# Patient Record
Sex: Female | Born: 1960 | Race: Black or African American | Hispanic: No | Marital: Married | Smoking: Former smoker
Health system: Southern US, Community
[De-identification: ages and names within clinical notes are randomized; demographics above are authoritative.]

## PROBLEM LIST (undated history)

## (undated) DIAGNOSIS — J449 Chronic obstructive pulmonary disease, unspecified: Secondary | ICD-10-CM

## (undated) DIAGNOSIS — J45909 Unspecified asthma, uncomplicated: Secondary | ICD-10-CM

## (undated) DIAGNOSIS — T7840XA Allergy, unspecified, initial encounter: Secondary | ICD-10-CM

## (undated) DIAGNOSIS — K429 Umbilical hernia without obstruction or gangrene: Secondary | ICD-10-CM

## (undated) DIAGNOSIS — G932 Benign intracranial hypertension: Secondary | ICD-10-CM

## (undated) DIAGNOSIS — I10 Essential (primary) hypertension: Secondary | ICD-10-CM

## (undated) DIAGNOSIS — E119 Type 2 diabetes mellitus without complications: Secondary | ICD-10-CM

## (undated) HISTORY — DX: Morbid (severe) obesity due to excess calories: E66.01

## (undated) HISTORY — PX: NASAL POLYP SURGERY: SHX186

## (undated) HISTORY — DX: Type 2 diabetes mellitus without complications: E11.9

## (undated) HISTORY — DX: Benign intracranial hypertension: G93.2

## (undated) HISTORY — DX: Essential (primary) hypertension: I10

## (undated) HISTORY — PX: CHOLECYSTECTOMY: SHX55

## (undated) HISTORY — DX: Umbilical hernia without obstruction or gangrene: K42.9

## (undated) HISTORY — PX: CARPAL TUNNEL RELEASE: SHX101

## (undated) HISTORY — DX: Unspecified asthma, uncomplicated: J45.909

## (undated) HISTORY — DX: Allergy, unspecified, initial encounter: T78.40XA

---

## 2000-11-04 ENCOUNTER — Other Ambulatory Visit: Admission: RE | Admit: 2000-11-04 | Discharge: 2000-11-04 | Payer: Self-pay | Admitting: Obstetrics and Gynecology

## 2001-11-29 ENCOUNTER — Ambulatory Visit (HOSPITAL_COMMUNITY): Admission: RE | Admit: 2001-11-29 | Discharge: 2001-11-29 | Payer: Self-pay | Admitting: Obstetrics and Gynecology

## 2001-11-29 ENCOUNTER — Encounter: Payer: Self-pay | Admitting: Obstetrics and Gynecology

## 2003-05-10 ENCOUNTER — Ambulatory Visit (HOSPITAL_COMMUNITY): Admission: RE | Admit: 2003-05-10 | Discharge: 2003-05-10 | Payer: Self-pay | Admitting: Internal Medicine

## 2004-04-18 ENCOUNTER — Emergency Department (HOSPITAL_COMMUNITY): Admission: EM | Admit: 2004-04-18 | Discharge: 2004-04-18 | Payer: Self-pay | Admitting: Emergency Medicine

## 2004-07-26 ENCOUNTER — Emergency Department (HOSPITAL_COMMUNITY): Admission: EM | Admit: 2004-07-26 | Discharge: 2004-07-26 | Payer: Self-pay | Admitting: Emergency Medicine

## 2004-09-16 ENCOUNTER — Emergency Department (HOSPITAL_COMMUNITY): Admission: EM | Admit: 2004-09-16 | Discharge: 2004-09-16 | Payer: Self-pay | Admitting: Emergency Medicine

## 2005-02-26 ENCOUNTER — Ambulatory Visit (HOSPITAL_COMMUNITY): Admission: RE | Admit: 2005-02-26 | Discharge: 2005-02-26 | Payer: Self-pay | Admitting: Internal Medicine

## 2006-08-03 ENCOUNTER — Ambulatory Visit (HOSPITAL_COMMUNITY): Admission: RE | Admit: 2006-08-03 | Discharge: 2006-08-03 | Payer: Self-pay | Admitting: Internal Medicine

## 2006-08-08 ENCOUNTER — Emergency Department (HOSPITAL_COMMUNITY): Admission: EM | Admit: 2006-08-08 | Discharge: 2006-08-08 | Payer: Self-pay | Admitting: Emergency Medicine

## 2006-08-17 ENCOUNTER — Ambulatory Visit: Payer: Self-pay | Admitting: Orthopedic Surgery

## 2006-09-17 ENCOUNTER — Ambulatory Visit: Payer: Self-pay | Admitting: Orthopedic Surgery

## 2006-11-17 ENCOUNTER — Ambulatory Visit: Payer: Self-pay | Admitting: Orthopedic Surgery

## 2009-01-05 ENCOUNTER — Emergency Department (HOSPITAL_COMMUNITY): Admission: EM | Admit: 2009-01-05 | Discharge: 2009-01-05 | Payer: Self-pay | Admitting: Emergency Medicine

## 2009-07-15 ENCOUNTER — Emergency Department (HOSPITAL_COMMUNITY): Admission: EM | Admit: 2009-07-15 | Discharge: 2009-07-15 | Payer: Self-pay | Admitting: Emergency Medicine

## 2009-10-28 ENCOUNTER — Emergency Department (HOSPITAL_COMMUNITY): Admission: EM | Admit: 2009-10-28 | Discharge: 2009-10-28 | Payer: Self-pay | Admitting: Emergency Medicine

## 2010-06-29 ENCOUNTER — Encounter: Payer: Self-pay | Admitting: Internal Medicine

## 2010-06-30 ENCOUNTER — Encounter: Payer: Self-pay | Admitting: Internal Medicine

## 2010-08-28 LAB — DIFFERENTIAL
Basophils Absolute: 0 10*3/uL (ref 0.0–0.1)
Basophils Relative: 0 % (ref 0–1)
Eosinophils Absolute: 0.1 10*3/uL (ref 0.0–0.7)
Eosinophils Relative: 1 % (ref 0–5)
Lymphocytes Relative: 3 % — ABNORMAL LOW (ref 12–46)
Lymphs Abs: 0.3 10*3/uL — ABNORMAL LOW (ref 0.7–4.0)
Monocytes Absolute: 0.4 10*3/uL (ref 0.1–1.0)
Monocytes Relative: 4 % (ref 3–12)
Neutro Abs: 8.8 10*3/uL — ABNORMAL HIGH (ref 1.7–7.7)
Neutrophils Relative %: 92 % — ABNORMAL HIGH (ref 43–77)

## 2010-08-28 LAB — PREGNANCY, URINE: Preg Test, Ur: NEGATIVE

## 2010-08-28 LAB — CBC
HCT: 43.9 % (ref 36.0–46.0)
Hemoglobin: 14.5 g/dL (ref 12.0–15.0)
MCHC: 33 g/dL (ref 30.0–36.0)
MCV: 81.6 fL (ref 78.0–100.0)
Platelets: 323 10*3/uL (ref 150–400)
RBC: 5.37 MIL/uL — ABNORMAL HIGH (ref 3.87–5.11)
RDW: 13.7 % (ref 11.5–15.5)
WBC: 9.6 10*3/uL (ref 4.0–10.5)

## 2010-08-28 LAB — URINALYSIS, ROUTINE W REFLEX MICROSCOPIC
Glucose, UA: NEGATIVE mg/dL
Ketones, ur: NEGATIVE mg/dL
Leukocytes, UA: NEGATIVE
Nitrite: NEGATIVE
Protein, ur: 100 mg/dL — AB
Specific Gravity, Urine: >1.03 — ABNORMAL HIGH (ref 1.005–1.030)
Urobilinogen, UA: 0.2 mg/dL (ref 0.0–1.0)
pH: 5.5 (ref 5.0–8.0)

## 2010-08-28 LAB — COMPREHENSIVE METABOLIC PANEL
ALT: 18 U/L (ref 0–35)
AST: 18 U/L (ref 0–37)
Albumin: 3.8 g/dL (ref 3.5–5.2)
Alkaline Phosphatase: 55 U/L (ref 39–117)
BUN: 12 mg/dL (ref 6–23)
CO2: 27 mEq/L (ref 19–32)
Calcium: 8.9 mg/dL (ref 8.4–10.5)
Chloride: 103 mEq/L (ref 96–112)
Creatinine, Ser: 0.74 mg/dL (ref 0.4–1.2)
GFR calc Af Amer: >60 mL/min (ref >60)
GFR calc non Af Amer: >60 mL/min (ref >60)
Glucose, Bld: 124 mg/dL — ABNORMAL HIGH (ref 70–99)
Potassium: 4.5 mEq/L (ref 3.5–5.1)
Sodium: 137 mEq/L (ref 135–145)
Total Bilirubin: 0.5 mg/dL (ref 0.3–1.2)
Total Protein: 7.1 g/dL (ref 6.0–8.3)

## 2010-08-28 LAB — URINE CULTURE: Colony Count: 15000

## 2010-08-28 LAB — URINE MICROSCOPIC-ADD ON

## 2010-08-28 LAB — LIPASE, BLOOD: Lipase: 16 U/L (ref 11–59)

## 2010-08-30 ENCOUNTER — Emergency Department (HOSPITAL_COMMUNITY)
Admission: EM | Admit: 2010-08-30 | Discharge: 2010-08-30 | Disposition: A | Discharge status: Home / Self Care | Payer: 59 | Attending: Emergency Medicine | Admitting: Emergency Medicine

## 2010-08-30 DIAGNOSIS — Z79899 Other long term (current) drug therapy: Secondary | ICD-10-CM | POA: Insufficient documentation

## 2010-08-30 DIAGNOSIS — I10 Essential (primary) hypertension: Secondary | ICD-10-CM | POA: Insufficient documentation

## 2010-08-30 DIAGNOSIS — J449 Chronic obstructive pulmonary disease, unspecified: Secondary | ICD-10-CM | POA: Insufficient documentation

## 2010-08-30 DIAGNOSIS — J4489 Other specified chronic obstructive pulmonary disease: Secondary | ICD-10-CM | POA: Insufficient documentation

## 2010-08-30 DIAGNOSIS — G8929 Other chronic pain: Secondary | ICD-10-CM | POA: Insufficient documentation

## 2010-08-30 DIAGNOSIS — E669 Obesity, unspecified: Secondary | ICD-10-CM | POA: Insufficient documentation

## 2010-08-30 DIAGNOSIS — L02419 Cutaneous abscess of limb, unspecified: Secondary | ICD-10-CM | POA: Insufficient documentation

## 2010-08-30 DIAGNOSIS — L03119 Cellulitis of unspecified part of limb: Secondary | ICD-10-CM | POA: Insufficient documentation

## 2010-08-30 DIAGNOSIS — E119 Type 2 diabetes mellitus without complications: Secondary | ICD-10-CM | POA: Insufficient documentation

## 2010-09-02 LAB — CULTURE, ROUTINE-ABSCESS

## 2010-10-25 NOTE — Consult Note (Signed)
[name], [name]                ACCOUNT NO.:  [id number]   MEDICAL RECORD NO.:  [id number]          PATIENT TYPE:  EMS   LOCATION:  ED                            FACILITY:  [facility]   PHYSICIAN:  Tilda Burrow, M.D. DATE OF BIRTH:  [birth year]   DATE OF CONSULTATION:  09/16/2004  DATE OF DISCHARGE:                                   CONSULTATION   CHIEF COMPLAINT:  Right lower quadrant pain, acute onset.   HISTORY OF PRESENT ILLNESS:  This 50 year old female with 20+ years of  infertility managed by Dr. Felecia Shelling for general medical problems, including  asthma and hypertension, presents to labor and delivery with a chief  complaint of right lower quadrant pain, acute onset this a.m., making the  patient leave work.  LMP was August 22, 2004.  The patient is not using  contraception, is sexually active as she has been for many years.  Pelvic  ultrasound performed by Dr. 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   Vital signs: Temperature 96.3, blood pressure 127/70, pulse 81, respirations  22.  Pain rated an 8/10 at 10 a.m. but by the time seen by me at 3 p.m., she  was pain-free due to IV analgesics and passage of time.   NEW MEDICATION LIST:  1.  Lisinopril 40 mg daily.  2.  Lasix p.o. daily.  3.  Albuterol MDI inhaler p.r.n.   Abdominal exam shows a nontender, obese abdomen with limited ability to  assess internal organs due to abdominal girth.  The patient is,  interestingly, quite optimistic at the possibility of having a hysterectomy  given that she hates the heavy menses she has been tolerating for a period  of time.   Laboratory data includes hemoglobin 13.3, hematocrit 39, white count 7600,  platelets 347,000.  Sodium 137, potassium 3.9.   PLAN:  Functional gynecologic pain felt most likely.  Gastrointestinal  review of systems negative.  Will simply follow up one week for change in  symptoms.  We can discuss review of old GYN records and reassessment of  right adnexa.   DISCHARGE MEDICATIONS:  Tylenol No. 3, 30 tablets, refill x1, one to two  q.4h. p.r.n. pain.    JVF/MEDQ  D:  09/16/2004  T:  09/16/2004  Job:  [id number]

## 2011-03-18 ENCOUNTER — Other Ambulatory Visit (HOSPITAL_COMMUNITY): Payer: Self-pay | Admitting: Internal Medicine

## 2011-03-18 DIAGNOSIS — Z139 Encounter for screening, unspecified: Secondary | ICD-10-CM

## 2011-03-24 ENCOUNTER — Telehealth: Payer: Self-pay

## 2011-03-24 NOTE — Telephone Encounter (Signed)
LMOM to call.

## 2011-03-26 ENCOUNTER — Telehealth: Payer: Self-pay | Admitting: Gastroenterology

## 2011-03-26 NOTE — Telephone Encounter (Signed)
PT RETURNED YOUR CALL SHE CAN BE REACHED AT [phone]

## 2011-03-26 NOTE — Telephone Encounter (Signed)
LMOM to call.

## 2011-03-27 NOTE — Telephone Encounter (Signed)
LMOM to call.

## 2011-04-01 ENCOUNTER — Ambulatory Visit (HOSPITAL_COMMUNITY): Payer: 59

## 2011-04-02 NOTE — Telephone Encounter (Signed)
LMOM to call. Mailing a letter to call also.  

## 2011-04-02 NOTE — Telephone Encounter (Signed)
Unable to reach pt by phone. Mailed a letter to call.

## 2012-09-16 ENCOUNTER — Other Ambulatory Visit: Payer: Self-pay | Admitting: Obstetrics and Gynecology

## 2012-11-04 ENCOUNTER — Other Ambulatory Visit: Payer: Self-pay | Admitting: Obstetrics and Gynecology

## 2012-11-17 ENCOUNTER — Other Ambulatory Visit: Payer: Self-pay

## 2012-11-17 DIAGNOSIS — G4730 Sleep apnea, unspecified: Secondary | ICD-10-CM

## 2012-12-12 ENCOUNTER — Ambulatory Visit: Payer: 59 | Attending: Internal Medicine | Admitting: Sleep Medicine

## 2012-12-12 DIAGNOSIS — G4730 Sleep apnea, unspecified: Secondary | ICD-10-CM

## 2012-12-12 DIAGNOSIS — G4733 Obstructive sleep apnea (adult) (pediatric): Secondary | ICD-10-CM | POA: Insufficient documentation

## 2012-12-13 NOTE — Procedures (Signed)
[facility] Marnita Poirier A. Gerilyn Pilgrim, MD     [email or web address]        NAMESOO, STEELMAN                ACCOUNT NO.:  [id number]  MEDICAL RECORD NO.:  [id number]          PATIENT TYPE:  OUT  LOCATION:  SLEEP LAB                     FACILITY:  [facility]  PHYSICIAN:  Siobhan Zaro A. Gerilyn Pilgrim, M.D. DATE OF BIRTH:  Oct 07, 1960  DATE OF STUDY:  12/12/2012                           NOCTURNAL POLYSOMNOGRAM  REFERRING PHYSICIAN:  Tesfaye D. Felecia Shelling, MD  INDICATION:  A 52 year old who presents with obesity, loud snoring, and fatigue.  MEDICATIONS:  None.  EPWORTH SLEEPINESS SCALE:  5.  BMI:  50.  ARCHITECTURAL SUMMARY:  The total recording time is 400 minutes.  Sleep efficiency 79%.  Sleep latency 8 minutes.  REM latency 67 minutes. Stage N1 9%, N2 is 48%, N3 28%, and REM sleep 50%.  RESPIRATORY SUMMARY:  Baseline oxygen saturation is 97, lowest saturation 77 during REM sleep.  Diagnostic AHI is 18 and RDI also 18. The patient had more events during REM sleep with a REM AHI being 72.  LIMB MOVEMENT SUMMARY:  PLM index 0.  ELECTROCARDIOGRAM SUMMARY:  Average heart rate is 83 with no significant dysrhythmias observed.  IMPRESSION:  Moderate REM related obstructive sleep apnea syndrome.  RECOMMENDATION:  Formal CPAP titration study.  Thanks for this referral.     Dora Simeone A. Gerilyn Pilgrim, M.D.    KAD/MEDQ  D:  12/13/2012 08:59:53  T:  12/13/2012 09:29:33  Job:  [id number]

## 2012-12-15 ENCOUNTER — Other Ambulatory Visit: Payer: Self-pay | Admitting: Obstetrics and Gynecology

## 2013-02-16 ENCOUNTER — Other Ambulatory Visit (HOSPITAL_COMMUNITY): Payer: Self-pay | Admitting: Internal Medicine

## 2013-02-16 DIAGNOSIS — Z139 Encounter for screening, unspecified: Secondary | ICD-10-CM

## 2013-02-21 ENCOUNTER — Inpatient Hospital Stay (HOSPITAL_COMMUNITY): Admission: RE | Admit: 2013-02-21 | Payer: 59 | Source: Ambulatory Visit

## 2013-03-07 ENCOUNTER — Other Ambulatory Visit: Payer: Self-pay | Admitting: Women's Health

## 2013-03-07 ENCOUNTER — Encounter: Payer: Self-pay | Admitting: Women's Health

## 2013-03-07 ENCOUNTER — Ambulatory Visit (INDEPENDENT_AMBULATORY_CARE_PROVIDER_SITE_OTHER): Payer: 59 | Admitting: Women's Health

## 2013-03-07 ENCOUNTER — Other Ambulatory Visit (HOSPITAL_COMMUNITY)
Admission: RE | Admit: 2013-03-07 | Discharge: 2013-03-07 | Disposition: A | Discharge status: Home / Self Care | Payer: 59 | Source: Ambulatory Visit | Attending: Obstetrics & Gynecology | Admitting: Obstetrics & Gynecology

## 2013-03-07 VITALS — BP 150/80 | Ht 61.0 in | Wt 253.2 lb

## 2013-03-07 DIAGNOSIS — Z1151 Encounter for screening for human papillomavirus (HPV): Secondary | ICD-10-CM | POA: Insufficient documentation

## 2013-03-07 DIAGNOSIS — N921 Excessive and frequent menstruation with irregular cycle: Secondary | ICD-10-CM

## 2013-03-07 DIAGNOSIS — Z01419 Encounter for gynecological examination (general) (routine) without abnormal findings: Secondary | ICD-10-CM | POA: Insufficient documentation

## 2013-03-07 DIAGNOSIS — Z6841 Body Mass Index (BMI) 40.0 and over, adult: Secondary | ICD-10-CM

## 2013-03-07 DIAGNOSIS — I10 Essential (primary) hypertension: Secondary | ICD-10-CM | POA: Insufficient documentation

## 2013-03-07 DIAGNOSIS — N950 Postmenopausal bleeding: Secondary | ICD-10-CM

## 2013-03-07 DIAGNOSIS — J45909 Unspecified asthma, uncomplicated: Secondary | ICD-10-CM | POA: Insufficient documentation

## 2013-03-07 DIAGNOSIS — E119 Type 2 diabetes mellitus without complications: Secondary | ICD-10-CM | POA: Insufficient documentation

## 2013-03-07 LAB — CBC
HCT: 31.4 % — ABNORMAL LOW (ref 36.0–46.0)
Hemoglobin: 10.2 g/dL — ABNORMAL LOW (ref 12.0–15.0)
MCH: 23.8 pg — ABNORMAL LOW (ref 26.0–34.0)
MCHC: 32.5 g/dL (ref 30.0–36.0)
MCV: 73.2 fL — ABNORMAL LOW (ref 78.0–100.0)
Platelets: 425 10*3/uL — ABNORMAL HIGH (ref 150–400)
RBC: 4.29 MIL/uL (ref 3.87–5.11)
RDW: 15.2 % (ref 11.5–15.5)
WBC: 9.6 10*3/uL (ref 4.0–10.5)

## 2013-03-07 NOTE — Progress Notes (Signed)
Patient ID: [name], female   DOB: 10-Jun-1960, 52 y.o.   MRN: [id number] Subjective:     [name] is a 52 y.o. [id number] African American married female here for what was down for as a new gyn visit d/t abnormal bleeding, but pt states was also supposed to be a routine well-woman exam. Referred by PCP Dr. Felecia Shelling.  Patient's last menstrual period was 03/02/2013.  Reports periods stopped for 365 days approximately 5 years ago at the same time she was having terrible hot flashes.  Per pt report, on day 366 she began bleeding again and has been having heavy bleeding lasting 5-6 days/month x the last 4 years. For the past 3 months, the bleeding has become heavier, is lasting 7-14 days, and cramps/pain has increased. She is changing a saturated pad ~5-6x/day and has silver-dollar sized clots. This month she bled 9/6-9/17, and then it began again on 9/24, and is currently still bleeding. Does take ASA 81mg  daily. Thinks she may have had fibroids at one time, but doesn't recall. Reports her mom had to have a hysterectomy d/t abnormal bleeding.  Dyspareunia x 7-8 months. Denies bladder or bowel problems.  CHTN and DM managed by Dr. Derrell Lolling routine labs by Dr. Felecia Shelling, states he has also checked her TSH recently and it was normal. Reports A1C as being around 7, is on metformin, and tries to watch carb intake. Doesn't exercise daily. Is on Lisinopril for CHTN.   Smoking Status: quit smoking in 1997  Personal health questionnaire reviewed: yes.   Gynecologic History Patient's last menstrual period was 03/02/2013. Contraception: none Last Pap: she thinks about 52yrs ago. Results were: thinks it was abnormal, but doesn't recall any procedures or f/u Last mammogram: 6-7 years ago. Results were: pt reports there was a pea-sized spot in one of breasts on   mammogram, and was told if she noticed any changes to come back.  Does report checking breasts monthly,   and states she is unable to feel any lumps, and  hasn't noticed any changes in breasts Has never had a colonoscopy, but states Dr. Felecia Shelling had referred her to have this done, and she has received papers in   mail to schedule this, and she plans on doing so asap  Obstetric History OB History  No data available  [phone], both SVDs   The following portions of the patient's history were reviewed and updated as appropriate: allergies, current medications, past family history, past medical history, past social history, past surgical history and problem list.  Review of Systems  Review of Systems  Constitutional: Negative for fever, chills, weight loss, malaise/fatigue and diaphoresis.  HENT: Negative for hearing loss, ear pain, nosebleeds, congestion, sore throat, neck       pain, tinnitus and ear discharge.   Eyes: Negative for blurred vision, double vision, photophobia, pain, discharge and       redness.  Respiratory: Negative for cough, hemoptysis, sputum production, shortness of breath,       wheezing and stridor.   Cardiovascular: Negative for chest pain, palpitations, orthopnea, claudication, leg       swelling and PND.  Gastrointestinal: negative for abdominal pain. Negative for heartburn, nausea, vomiting,       diarrhea, constipation, blood in stool and melena.  Genitourinary: Negative for dysuria, urgency, frequency, hematuria, flank pain,       Incontinence. Pos for dyspareunia, postmenopausal bleeding  Musculoskeletal: Negative for myalgias, back pain, joint pain and falls.  Skin: Negative for  itching and rash.  Neurological: Negative for dizziness, tingling, tremors, sensory change, speech change,     focal weakness, seizures, loss of consciousness, weakness and headaches.  Endo/Heme/Allergies: Negative for environmental allergies and polydipsia. Does not       bruise/bleed easily.  Psychiatric/Behavioral: Negative for depression, suicidal ideas, hallucinations, memory       loss and substance abuse. The patient is not  nervous/anxious and does not have       insomnia.        Objective:     Physical Exam  BP 150/80  Ht 5\' 1"  (1.549 m)  Wt 253 lb 3.2 oz (114.851 kg)  BMI 47.87 kg/m2  LMP 03/02/2013 Constitutional: She is oriented to person, place, and time. She appears well-developed    and well-nourished.  HEENT:     Head: Normocephalic and atraumatic.           Right Ear: External ear normal.     Left Ear: External ear normal.     Nose: Nose normal.     Mouth/Throat: Oropharynx is clear and moist.     Eyes: Conjunctivae and EOM are normal. Pupils are equal, round, and reactive to light.    Right eye exhibits no discharge. Left eye exhibits no discharge. No scleral icterus.  Neck: Normal range of motion. Neck supple. No tracheal deviation present. No          thyromegaly present.  Cardiovascular: Normal rate, regular rhythm, normal heart sounds and intact distal          pulses.  Exam reveals no gallop and no friction rub.  No murmur heard. Respiratory: Effort normal and breath sounds normal. No respiratory distress. She has       no wheezes. She has no rales. She exhibits no tenderness.  Breasts: no dominate palpable mass, retraction or nipple discharge  GI: Soft. Bowel sounds are normal. She exhibits no distension and no mass. There is no    tenderness. There is no rebound and no guarding.     Hemoccult: Pos, but blood from vagina had been around rectum. Had pt wipe really well w/ wet wipes and no blood    was visible on exam glove.  Unsure if this is a true pos, or possible contamination from vb.  Genitourinary:     Vulva is normal without lesions    Vagina is pink moist w/ dark red blood    Cervix normal in appearance and thin prep pap is done w/ HR HPV cotesting    Uterus: unable to palpate d/t body habitus    Adnexa: unable to palpate d/t body habitus Musculoskeletal: Normal range of motion. She exhibits no edema and no tenderness.  Neurological: She is alert and oriented to person,  place, and time. She has normal    reflexes. She displays normal reflexes. No cranial nerve deficit. She exhibits normal    muscle tone. Coordination normal.  Skin: Skin is warm and dry. No rash noted. No erythema. No pallor.  Psychiatric: She has a normal mood and affect. Her behavior is normal. Judgment and    thought content normal.       Assessment:     Healthy well-woman exam. Postmenopausal bleeding x 4 yrs Dyspareunia Pos hemoccult, but possibly contaminated by vb Morbid obesity BMI 47.87 CHTN on Lisinopril DM on Metformin Asthma Needs screening Mammogram and TCS      Plan:  CBC, FSH today F/U ASAP for pelvic u/s and f/u appt to discuss results  w/ MD Recommended home hemoccult sampling, but pt states she will call and schedule GI appt/TCS asap To sign release of records and obtain last pap results from Dr. Mora Appl and latest labs from Dr. Felecia Shelling To begin walking x daily, decrease carbs Mammogram ASAP  Colonoscopy ASAP   Marge Duncans 03/07/2013 5:16 PM

## 2013-03-07 NOTE — Patient Instructions (Signed)
Postmenopausal Bleeding Menopause is commonly referred to as the "change in life." It is a time when the fertile years, the time of ovulating and having menstrual periods, has come to an end. It is also determined by not having menstrual periods for 12 months.  Postmenopausal bleeding is any bleeding a woman has after she has entered into menopause. Any type of postmenopausal bleeding, even if it appears to be a typical menstrual period, is concerning. This should be evaluated by your caregiver.  CAUSES   Hormone therapy.  Cancer of the cervix or cancer of the lining of the uterus (endometrial cancer).  Thinning of the uterine lining (uterine atrophy).  Thyroid diseases.  Certain medicines.  Infection of the uterus or cervix.  Inflammation or irritation of the uterine lining (endometritis).  Estrogen-secreting tumors.  Growths (polyps) on the cervix, uterine lining, or uterus.  Uterine tumors (fibroids).  Being very overweight (obese). DIAGNOSIS  Your caregiver will take a medical history and ask questions. A physical exam will also be performed. Further tests may include:   A transvaginal ultrasound. An ultrasound wand or probe is inserted into your vagina to view the pelvic organs.  A biopsy of the lining of the uterus (endometrium). A sample of the endometrium is removed and examined.  A hysteroscopy. Your caregiver may use an instrument with a light and a camera attached to it (hysteroscope). The hysteroscope is used to look inside the uterus for problems.  A dilation and curettage (D&C). Tissue is removed from the uterine lining to be examined for problems. TREATMENT  Treatment depends on the cause of the bleeding. Some treatments include:   Surgery.  Medicines.  Hormones.  A hysteroscopy or D&C to remove polyps or fibroids.  Changing or stopping a current medicine you are taking. Talk to your caregiver about your specific treatment. HOME CARE INSTRUCTIONS    Maintain a healthy weight.  Keep regular pelvic exams and Pap tests. SEEK MEDICAL CARE IF:   You have bleeding, even if it is light in comparison to your previous periods.  Your bleeding lasts more than 1 week.  You have abdominal pain.  You develop bleeding with sexual intercourse. SEEK IMMEDIATE MEDICAL CARE IF:   You have a fever, chills, headache, dizziness, muscle aches, and bleeding.  You have severe pain with bleeding.  You are passing blood clots.  You have bleeding and need more than 1 pad an hour.  You feel faint. MAKE SURE YOU:  Understand these instructions.  Will watch your condition.  Will get help right away if you are not doing well or get worse. Document Released: 09/03/2005 Document Revised: 08/18/2011 Document Reviewed: 01/30/2011 [employer] Patient Information 2014 [employer].  

## 2013-03-08 ENCOUNTER — Encounter: Payer: Self-pay | Admitting: Women's Health

## 2013-03-08 LAB — FOLLICLE STIMULATING HORMONE: FSH: 8 m[IU]/mL

## 2013-03-08 NOTE — Addendum Note (Signed)
Addended by: Cheral Marker on: 03/08/2013 04:24 PM   Modules accepted: Orders

## 2013-03-09 LAB — TSH: TSH: 3.399 u[IU]/mL (ref 0.350–4.500)

## 2013-03-15 ENCOUNTER — Ambulatory Visit (INDEPENDENT_AMBULATORY_CARE_PROVIDER_SITE_OTHER): Payer: 59 | Admitting: Obstetrics & Gynecology

## 2013-03-15 ENCOUNTER — Ambulatory Visit (INDEPENDENT_AMBULATORY_CARE_PROVIDER_SITE_OTHER): Payer: 59

## 2013-03-15 ENCOUNTER — Other Ambulatory Visit: Payer: Self-pay | Admitting: Women's Health

## 2013-03-15 ENCOUNTER — Telehealth: Payer: Self-pay

## 2013-03-15 ENCOUNTER — Encounter: Payer: Self-pay | Admitting: Obstetrics & Gynecology

## 2013-03-15 VITALS — BP 140/90 | Wt 253.0 lb

## 2013-03-15 DIAGNOSIS — R9389 Abnormal findings on diagnostic imaging of other specified body structures: Secondary | ICD-10-CM

## 2013-03-15 DIAGNOSIS — N950 Postmenopausal bleeding: Secondary | ICD-10-CM

## 2013-03-15 NOTE — Telephone Encounter (Signed)
Pt was referred by Dr. Fanta for screening colonoscopy. LMOM for a return call.  

## 2013-03-15 NOTE — Progress Notes (Signed)
Patient ID: [name], female   DOB: [birth year], 52 y.o.   MRN: [id number] The patient was seen by Shawna Clamp for her yearly exam Patient has been having increasing problems with vaginal bleeding, bleeding for 2 weeks off one week for the past couple of months About 4 years ago she actually went a year with no bleeding but then began to have periods again Dr. Mora Appl placed her on cyclical Provera which managed her bleeding just fine However she discontinue that therapy  She had a sonogram today which showed fibroids which are chronic compared to a sonogram from 2006 And an endometrial stripe of 26 mm homogeneous  As a result the to the significant bleeding and the significantly thickened endometrium she needs a formal endometrial evaluation with hysteroscopy and uterine curettage in the OR Is tentatively scheduled for October 24 patient will call back if it's a problem with her work schedule

## 2013-03-21 ENCOUNTER — Encounter (HOSPITAL_COMMUNITY): Payer: Self-pay | Admitting: Pharmacy Technician

## 2013-03-25 ENCOUNTER — Encounter (HOSPITAL_COMMUNITY)
Admission: RE | Admit: 2013-03-25 | Discharge: 2013-03-25 | Disposition: A | Discharge status: Home / Self Care | Payer: 59 | Source: Ambulatory Visit | Attending: Obstetrics & Gynecology | Admitting: Obstetrics & Gynecology

## 2013-03-25 ENCOUNTER — Encounter (HOSPITAL_COMMUNITY): Payer: Self-pay

## 2013-03-25 DIAGNOSIS — Z01812 Encounter for preprocedural laboratory examination: Secondary | ICD-10-CM | POA: Insufficient documentation

## 2013-03-25 LAB — COMPREHENSIVE METABOLIC PANEL
ALT: 21 U/L (ref 0–35)
AST: 27 U/L (ref 0–37)
Albumin: 3.5 g/dL (ref 3.5–5.2)
Alkaline Phosphatase: 62 U/L (ref 39–117)
BUN: 10 mg/dL (ref 6–23)
CO2: 26 mEq/L (ref 19–32)
Calcium: 9.9 mg/dL (ref 8.4–10.5)
Chloride: 98 mEq/L (ref 96–112)
Creatinine, Ser: 0.69 mg/dL (ref 0.50–1.10)
GFR calc Af Amer: >90 mL/min (ref >90)
GFR calc non Af Amer: >90 mL/min (ref >90)
Glucose, Bld: 119 mg/dL — ABNORMAL HIGH (ref 70–99)
Potassium: 4.3 mEq/L (ref 3.5–5.1)
Sodium: 135 mEq/L (ref 135–145)
Total Bilirubin: <0.1 mg/dL — ABNORMAL LOW (ref 0.3–1.2)
Total Protein: 6.9 g/dL (ref 6.0–8.3)

## 2013-03-25 LAB — URINALYSIS, ROUTINE W REFLEX MICROSCOPIC
Bilirubin Urine: NEGATIVE
Glucose, UA: NEGATIVE mg/dL
Hgb urine dipstick: NEGATIVE
Ketones, ur: NEGATIVE mg/dL
Leukocytes, UA: NEGATIVE
Nitrite: NEGATIVE
Protein, ur: NEGATIVE mg/dL
Specific Gravity, Urine: 1.02 (ref 1.005–1.030)
Urobilinogen, UA: 0.2 mg/dL (ref 0.0–1.0)
pH: 7.5 (ref 5.0–8.0)

## 2013-03-25 LAB — CBC
HCT: 33 % — ABNORMAL LOW (ref 36.0–46.0)
Hemoglobin: 10.3 g/dL — ABNORMAL LOW (ref 12.0–15.0)
MCH: 23.1 pg — ABNORMAL LOW (ref 26.0–34.0)
MCHC: 31.2 g/dL (ref 30.0–36.0)
MCV: 74.2 fL — ABNORMAL LOW (ref 78.0–100.0)
Platelets: 367 10*3/uL (ref 150–400)
RBC: 4.45 MIL/uL (ref 3.87–5.11)
RDW: 15.1 % (ref 11.5–15.5)
WBC: 8.6 10*3/uL (ref 4.0–10.5)

## 2013-03-25 LAB — HCG, QUANTITATIVE, PREGNANCY: hCG, Beta Chain, Quant, S: <1 m[IU]/mL (ref <5)

## 2013-03-25 NOTE — Patient Instructions (Signed)
[name]  03/25/2013   Your procedure is scheduled on:   04/01/2013  Report to [facility] at  615  AM.  Call this number if you have problems the morning of surgery: [phone]   Remember:   Do not eat food or drink liquids after midnight.   Take these medicines the morning of surgery with A SIP OF WATER: lisinopril  Do not wear jewelry, make-up or nail polish.  Do not wear lotions, powders, or perfumes.   Do not shave 48 hours prior to surgery. Men may shave face and neck.  Do not bring valuables to the hospital.  [facility] is not responsible for any belongings or valuables.               Contacts, dentures or bridgework may not be worn into surgery.  Leave suitcase in the car. After surgery it may be brought to your room.  For patients admitted to the hospital, discharge time is determined by your treatment team.               Patients discharged the day of surgery will not be allowed to drive home.  Name and phone number of your driver: family  Special Instructions: Shower using CHG 2 nights before surgery and the night before surgery.  If you shower the day of surgery use CHG.  Use special wash - you have one bottle of CHG for all showers.  You should use approximately 1/3 of the bottle for each shower.   Please read over the following fact sheets that you were given: Pain Booklet, Coughing and Deep Breathing, Surgical Site Infection Prevention, Anesthesia Post-op Instructions and Care and Recovery After Surgery Hysteroscopy Hysteroscopy is a procedure used for looking inside the womb (uterus). It may be done for many different reasons, including:  To evaluate abnormal bleeding, fibroid (benign, noncancerous) tumors, polyps, scar tissue (adhesions), and possibly cancer of the uterus.  To look for lumps (tumors) and other uterine growths.  To look for causes of why a woman cannot get pregnant (infertility), causes of recurrent loss of pregnancy (miscarriages),  or a lost intrauterine device (IUD).  To perform a sterilization by blocking the fallopian tubes from inside the uterus. A hysteroscopy should be done right after a menstrual period to be sure you are not pregnant. LET YOUR CAREGIVER KNOW ABOUT:   Allergies.  Medicines taken, including herbs, eyedrops, over-the-counter medicines, and creams.  Use of steroids (by mouth or creams).  Previous problems with anesthetics or numbing medicines.  History of bleeding or blood problems.  History of blood clots.  Possibility of pregnancy, if this applies.  Previous surgery.  Other health problems. RISKS AND COMPLICATIONS   Putting a hole in the uterus.  Excessive bleeding.  Infection.  Damage to the cervix.  Injury to other organs.  Allergic reaction to medicines.  Too much fluid used in the uterus for the procedure. BEFORE THE PROCEDURE   Do not take aspirin or blood thinners for a week before the procedure, or as directed. It can cause bleeding.  Arrive at least 60 minutes before the procedure or as directed to read and sign the necessary forms.  Arrange for someone to take you home after the procedure.  If you smoke, do not smoke for 2 weeks before the procedure. PROCEDURE   Your caregiver may give you medicine to relax you. He or she may also give you a medicine that numbs the  area around the cervix (local anesthetic) or a medicine that makes you sleep (general anesthesia).  Sometimes, a medicine is placed in the cervix the day before the procedure. This medicine makes the cervix have a larger opening (dilate). This makes it easier for the instrument to be inserted into the uterus.  A small instrument (hysteroscope) is inserted through the vagina into the uterus. This instrument is similar to a pencil-sized telescope with a light.  During the procedure, air or a liquid is put into the uterus, which allows the surgeon to see better.  Sometimes, tissue is gently scraped  from inside the uterus. These tissue samples are sent to a specialist who looks at tissue samples (pathologist). The pathologist will give a report to your caregiver. This will help your caregiver decide if further treatment is necessary. The report will also help your caregiver decide on the best treatment if the test comes back abnormal. AFTER THE PROCEDURE   If you had a general anesthetic, you may be groggy for a couple hours after the procedure.  If you had a local anesthetic, you will be advised to rest at the surgical center or caregiver's office until you are stable and feel ready to go home.  You may have some cramping for a couple days.  You may have bleeding, which varies from light spotting for a few days to menstrual-like bleeding for up to 3 to 7 days. This is normal.  Have someone take you home. FINDING OUT THE RESULTS OF YOUR TEST Not all test results are available during your visit. If your test results are not back during the visit, make an appointment with your caregiver to find out the results. Do not assume everything is normal if you have not heard from your caregiver or the medical facility. It is important for you to follow up on all of your test results. HOME CARE INSTRUCTIONS   Do not drive for 24 hours or as instructed.  Only take over-the-counter or prescription medicines for pain, discomfort, or fever as directed by your caregiver.  Do not take aspirin. It can cause or aggravate bleeding.  Do not drive or drink alcohol while taking pain medicine.  You may resume your usual diet.  Do not use tampons, douche, or have sexual intercourse for 2 weeks, or as advised by your caregiver.  Rest and sleep for the first 24 to 48 hours.  Take your temperature twice a day for 4 to 5 days. Write it down. Give these temperatures to your caregiver if they are abnormal (above 98.6 F or 37.0 C).  Take medicines your caregiver has ordered as directed.  Follow your  caregiver's advice regarding diet, exercise, lifting, driving, and general activities.  Take showers instead of baths for 2 weeks, or as recommended by your caregiver.  If you develop constipation:  Take a mild laxative with the advice of your caregiver.  Eat bran foods.  Drink enough water and fluids to keep your urine clear or pale yellow.  Try to have someone with you or available to you for the first 24 to 48 hours, especially if you had a general anesthetic.  Make sure you and your family understand everything about your operation and recovery.  Follow your caregiver's advice regarding follow-up appointments and Pap smears. SEEK MEDICAL CARE IF:   You feel dizzy or lightheaded.  You feel sick to your stomach (nauseous).  You develop abnormal vaginal discharge.  You develop a rash.  You have an  abnormal reaction or allergy to your medicine.  You need stronger pain medicine. SEEK IMMEDIATE MEDICAL CARE IF:   Bleeding is heavier than a normal menstrual period or you have blood clots.  You have an oral temperature above 102 F (38.9 C), not controlled by medicine.  You have increasing cramps or pains not relieved with medicine.  You develop belly (abdominal) pain that does not seem to be related to the same area of earlier cramping and pain.  You pass out.  You develop pain in the tops of your shoulders (shoulder strap areas).  You develop shortness of breath. MAKE SURE YOU:   Understand these instructions.  Will watch your condition.  Will get help right away if you are not doing well or get worse. Document Released: 09/01/2000 Document Revised: 08/18/2011 Document Reviewed: 12/25/2008 [facility] [place] Patient Information 2014 [place], [place]. Dilation and Curettage or Vacuum Curettage Dilation and curettage (D&C) and vacuum curettage are minor procedures. A D&C involves stretching (dilation) the cervix and scraping (curettage) the inside lining of the womb (uterus).  During a D&C, tissue is gently scraped from the inside lining of the uterus. During a vacuum curettage, the lining and tissue in the uterus are removed with the use of gentle suction. Curettage may be performed for diagnostic or therapeutic purposes. As a diagnostic procedure, curettage is performed for the purpose of examining tissues from the uterus. Tissue examination may help determine causes or treatment options for symptoms. A diagnostic curettage may be performed for the following symptoms:  Irregular bleeding in the uterus.  Bleeding with the development of clots.  Spotting between menstrual periods.  Prolonged menstrual periods.  Bleeding after menopause.  No menstrual period (amenorrhea).  A change in size and shape of the uterus. A therapeutic curettage is performed to remove tissue, blood, or a contraceptive device. Therapeutic curettage may be performed for the following conditions:   Removal of an IUD (intrauterine device).  Removal of retained placenta after giving birth. Retained placenta can cause bleeding severe enough to require transfusions or an infection.  Abortion.  Miscarriage.  Removal of polyps inside the uterus.  Removal of uncommon types of fibroids (noncancerous lumps). LET YOUR CAREGIVER KNOW ABOUT:   Allergies to food or medicine.  Medicines taken, including vitamins, herbs, eyedrops, over-the-counter medicines, and creams.  Use of steroids (by mouth or creams).  Previous problems with anesthetics or numbing medicines.  History of bleeding problems or blood clots.  Previous surgery.  Other health problems, including diabetes and kidney problems.  Possibility of pregnancy, if this applies. RISKS AND COMPLICATIONS   Excessive bleeding.  Infection of the uterus.  Damage to the cervix.  Development of scar tissue (adhesions) inside the uterus, later causing abnormal amounts of menstrual bleeding.  Complications from the general  anesthetic, if a general anesthetic is used.  Putting a hole (perforation) in the uterus. This is rare. BEFORE THE PROCEDURE   Eat and drink before the procedure only as directed by your caregiver.  Arrange for someone to take you home. PROCEDURE   This procedure may be done in a hospital, outpatient clinic, or caregiver's office.  You may be given a general anesthetic or a local anesthetic in and around the cervix.  You will lie on your back with your legs in stirrups.  There are two ways in which your cervix can be softened and dilated. These include:  Taking a medicine.  Having thin rods (laminaria) inserted into your cervix.  A curved tool (curette) will  scrape cells from the inside lining of the uterus and will then be removed. This procedure usually takes about 15 to 30 minutes. AFTER THE PROCEDURE   You will rest in the recovery area until you are stable and are ready to go home.  You will need to have someone take you home.  You may feel sick to your stomach (nauseous) or throw up (vomit) if you had general anesthesia.  You may have a sore throat if a tube was placed in your throat during general anesthesia.  You may have light cramping and bleeding for 2 days to 2 weeks after the procedure.  Your uterus needs to make a new lining after the procedure. This may make your next period late. Document Released: 05/26/2005 Document Revised: 08/18/2011 Document Reviewed: 12/22/2008 [facility] Patient Information 2014 [place], [place]. PATIENT INSTRUCTIONS POST-ANESTHESIA  IMMEDIATELY FOLLOWING SURGERY:  Do not drive or operate machinery for the first twenty four hours after surgery.  Do not make any important decisions for twenty four hours after surgery or while taking narcotic pain medications or sedatives.  If you develop intractable nausea and vomiting or a severe headache please notify your doctor immediately.  FOLLOW-UP:  Please make an appointment with your surgeon as  instructed. You do not need to follow up with anesthesia unless specifically instructed to do so.  WOUND CARE INSTRUCTIONS (if applicable):  Keep a dry clean dressing on the anesthesia/puncture wound site if there is drainage.  Once the wound has quit draining you may leave it open to air.  Generally you should leave the bandage intact for twenty four hours unless there is drainage.  If the epidural site drains for more than 36-48 hours please call the anesthesia department.  QUESTIONS?:  Please feel free to call your physician or the hospital operator if you have any questions, and they will be happy to assist you.

## 2013-03-28 NOTE — Telephone Encounter (Signed)
Letter to PCP

## 2013-04-01 ENCOUNTER — Encounter (HOSPITAL_COMMUNITY)
Admission: RE | Disposition: A | Discharge status: Home / Self Care | Payer: Self-pay | Source: Ambulatory Visit | Attending: Obstetrics & Gynecology

## 2013-04-01 ENCOUNTER — Encounter (HOSPITAL_COMMUNITY): Payer: Self-pay | Admitting: *Deleted

## 2013-04-01 ENCOUNTER — Ambulatory Visit (HOSPITAL_COMMUNITY): Payer: 59 | Admitting: Anesthesiology

## 2013-04-01 ENCOUNTER — Ambulatory Visit (HOSPITAL_COMMUNITY)
Admission: RE | Admit: 2013-04-01 | Discharge: 2013-04-01 | Disposition: A | Discharge status: Home / Self Care | Payer: 59 | Source: Ambulatory Visit | Attending: Obstetrics & Gynecology | Admitting: Obstetrics & Gynecology

## 2013-04-01 ENCOUNTER — Encounter (HOSPITAL_COMMUNITY): Payer: 59 | Admitting: Anesthesiology

## 2013-04-01 DIAGNOSIS — Z9889 Other specified postprocedural states: Secondary | ICD-10-CM

## 2013-04-01 DIAGNOSIS — I10 Essential (primary) hypertension: Secondary | ICD-10-CM | POA: Insufficient documentation

## 2013-04-01 DIAGNOSIS — N950 Postmenopausal bleeding: Secondary | ICD-10-CM | POA: Insufficient documentation

## 2013-04-01 DIAGNOSIS — N925 Other specified irregular menstruation: Secondary | ICD-10-CM

## 2013-04-01 DIAGNOSIS — N938 Other specified abnormal uterine and vaginal bleeding: Secondary | ICD-10-CM

## 2013-04-01 DIAGNOSIS — R9389 Abnormal findings on diagnostic imaging of other specified body structures: Secondary | ICD-10-CM

## 2013-04-01 DIAGNOSIS — Z01812 Encounter for preprocedural laboratory examination: Secondary | ICD-10-CM | POA: Insufficient documentation

## 2013-04-01 DIAGNOSIS — D259 Leiomyoma of uterus, unspecified: Secondary | ICD-10-CM

## 2013-04-01 DIAGNOSIS — E119 Type 2 diabetes mellitus without complications: Secondary | ICD-10-CM | POA: Insufficient documentation

## 2013-04-01 DIAGNOSIS — N949 Unspecified condition associated with female genital organs and menstrual cycle: Secondary | ICD-10-CM

## 2013-04-01 HISTORY — PX: HYSTEROSCOPY W/D&C: SHX1775

## 2013-04-01 LAB — GLUCOSE, CAPILLARY
Glucose-Capillary: 132 mg/dL — ABNORMAL HIGH (ref 70–99)
Glucose-Capillary: 105 mg/dL — ABNORMAL HIGH (ref 70–99)

## 2013-04-01 SURGERY — DILATATION AND CURETTAGE /HYSTEROSCOPY
Anesthesia: General | Site: Uterus | Wound class: Clean Contaminated

## 2013-04-01 MED ORDER — ONDANSETRON HCL 4 MG/2ML IJ SOLN: 4.0000 mg | Freq: Once | INTRAMUSCULAR | Status: DC | PRN

## 2013-04-01 MED ORDER — MIDAZOLAM HCL 2 MG/2ML IJ SOLN
1.0000 mg | INTRAMUSCULAR | Status: DC | PRN
  Administered 2013-04-01: 2 mg via INTRAVENOUS

## 2013-04-01 MED ORDER — 0.9 % SODIUM CHLORIDE (POUR BTL) OPTIME
TOPICAL | Status: DC | PRN
  Administered 2013-04-01: 1000 mL

## 2013-04-01 MED ORDER — PROPOFOL 10 MG/ML IV EMUL
INTRAVENOUS | Status: AC
  Filled 2013-04-01: qty 20

## 2013-04-01 MED ORDER — FENTANYL CITRATE 0.05 MG/ML IJ SOLN
25.0000 ug | INTRAMUSCULAR | Status: AC
  Administered 2013-04-01 (×2): 25 ug via INTRAVENOUS

## 2013-04-01 MED ORDER — LIDOCAINE HCL (PF) 1 % IJ SOLN
INTRAMUSCULAR | Status: AC
  Filled 2013-04-01: qty 5

## 2013-04-01 MED ORDER — FENTANYL CITRATE 0.05 MG/ML IJ SOLN
INTRAMUSCULAR | Status: DC | PRN
  Administered 2013-04-01: 50 ug via INTRAVENOUS
  Administered 2013-04-01: 25 ug via INTRAVENOUS

## 2013-04-01 MED ORDER — KETOROLAC TROMETHAMINE 10 MG PO TABS: 10.0000 mg | ORAL_TABLET | Freq: Three times a day (TID) | ORAL | Status: DC | PRN

## 2013-04-01 MED ORDER — SODIUM CHLORIDE 0.9 % IR SOLN
Status: DC | PRN
  Administered 2013-04-01: 3000 mL

## 2013-04-01 MED ORDER — KETOROLAC TROMETHAMINE 30 MG/ML IJ SOLN
30.0000 mg | Freq: Once | INTRAMUSCULAR | Status: AC
  Administered 2013-04-01: 30 mg via INTRAVENOUS
  Filled 2013-04-01: qty 1

## 2013-04-01 MED ORDER — HYDROCODONE-ACETAMINOPHEN 5-325 MG PO TABS: 1.0000 | ORAL_TABLET | Freq: Four times a day (QID) | ORAL | Status: DC | PRN

## 2013-04-01 MED ORDER — LIDOCAINE HCL (CARDIAC) 10 MG/ML IV SOLN
INTRAVENOUS | Status: DC | PRN
  Administered 2013-04-01: 20 mg via INTRAVENOUS

## 2013-04-01 MED ORDER — FENTANYL CITRATE 0.05 MG/ML IJ SOLN
INTRAMUSCULAR | Status: AC
  Filled 2013-04-01: qty 2

## 2013-04-01 MED ORDER — ONDANSETRON HCL 8 MG PO TABS: 8.0000 mg | ORAL_TABLET | Freq: Three times a day (TID) | ORAL | Status: DC | PRN

## 2013-04-01 MED ORDER — ONDANSETRON HCL 4 MG/2ML IJ SOLN
INTRAMUSCULAR | Status: AC
  Filled 2013-04-01: qty 2

## 2013-04-01 MED ORDER — ONDANSETRON HCL 4 MG/2ML IJ SOLN
4.0000 mg | Freq: Once | INTRAMUSCULAR | Status: AC
  Administered 2013-04-01: 4 mg via INTRAVENOUS

## 2013-04-01 MED ORDER — CEFAZOLIN SODIUM-DEXTROSE 2-3 GM-% IV SOLR
2.0000 g | INTRAVENOUS | Status: AC
  Administered 2013-04-01: 2 g via INTRAVENOUS
  Filled 2013-04-01: qty 50

## 2013-04-01 MED ORDER — PROPOFOL 10 MG/ML IV BOLUS
INTRAVENOUS | Status: DC | PRN
  Administered 2013-04-01: 150 mg via INTRAVENOUS
  Administered 2013-04-01 (×2): 20 mg via INTRAVENOUS

## 2013-04-01 MED ORDER — LACTATED RINGERS IV SOLN
INTRAVENOUS | Status: DC
  Administered 2013-04-01: 07:00:00 via INTRAVENOUS

## 2013-04-01 MED ORDER — MIDAZOLAM HCL 2 MG/2ML IJ SOLN
INTRAMUSCULAR | Status: AC
  Filled 2013-04-01: qty 2

## 2013-04-01 MED ORDER — FENTANYL CITRATE 0.05 MG/ML IJ SOLN: 25.0000 ug | INTRAMUSCULAR | Status: DC | PRN

## 2013-04-01 SURGICAL SUPPLY — 27 items
BAG HAMPER (MISCELLANEOUS) ×2 IMPLANT
CLOTH BEACON ORANGE TIMEOUT ST (SAFETY) ×2 IMPLANT
COVER LIGHT HANDLE STERIS (MISCELLANEOUS) ×4 IMPLANT
FORMALIN 10 PREFIL 120ML (MISCELLANEOUS) ×2 IMPLANT
GAUZE SPONGE 4X4 16PLY XRAY LF (GAUZE/BANDAGES/DRESSINGS) ×2 IMPLANT
GLOVE BIOGEL PI IND STRL 7.5 (GLOVE) IMPLANT
GLOVE BIOGEL PI IND STRL 8 (GLOVE) ×1 IMPLANT
GLOVE BIOGEL PI INDICATOR 7.5 (GLOVE) ×2
GLOVE BIOGEL PI INDICATOR 8 (GLOVE) ×1
GLOVE ECLIPSE 7.0 STRL STRAW (GLOVE) ×1 IMPLANT
GLOVE ECLIPSE 8.0 STRL XLNG CF (GLOVE) ×2 IMPLANT
GLOVE EXAM NITRILE LRG STRL (GLOVE) ×1 IMPLANT
GOWN STRL REIN XL XLG (GOWN DISPOSABLE) ×5 IMPLANT
INST SET HYSTEROSCOPY (KITS) ×2 IMPLANT
IV NS IRRIG 3000ML ARTHROMATIC (IV SOLUTION) ×2 IMPLANT
KIT ROOM TURNOVER AP CYSTO (KITS) ×2 IMPLANT
MANIFOLD NEPTUNE II (INSTRUMENTS) ×2 IMPLANT
MARKER SKIN DUAL TIP RULER LAB (MISCELLANEOUS) ×1 IMPLANT
NS IRRIG 1000ML POUR BTL (IV SOLUTION) ×2 IMPLANT
PACK BASIC III (CUSTOM PROCEDURE TRAY) ×2
PACK SRG BSC III STRL LF ECLPS (CUSTOM PROCEDURE TRAY) ×1 IMPLANT
PAD ARMBOARD 7.5X6 YLW CONV (MISCELLANEOUS) ×2 IMPLANT
PAD TELFA 3X4 1S STER (GAUZE/BANDAGES/DRESSINGS) ×2 IMPLANT
SET IRRIG Y TYPE TUR BLADDER L (SET/KITS/TRAYS/PACK) ×2 IMPLANT
SHEET LAVH (DRAPES) ×2 IMPLANT
TOWEL OR 17X26 4PK STRL BLUE (TOWEL DISPOSABLE) ×2 IMPLANT
YANKAUER SUCT BULB TIP 10FT TU (MISCELLANEOUS) ×2 IMPLANT

## 2013-04-01 NOTE — Anesthesia Preprocedure Evaluation (Addendum)
Anesthesia Evaluation  Patient identified by MRN, date of birth, ID band Patient awake    Reviewed: Allergy & Precautions, H&P , NPO status , Patient's Chart, lab work & pertinent test results  Airway Mallampati: II TM Distance: >3 FB     Dental  (+) Teeth Intact   Pulmonary asthma ,  breath sounds clear to auscultation        Cardiovascular hypertension, Pt. on medications Rhythm:Regular Rate:Normal     Neuro/Psych    GI/Hepatic   Endo/Other  diabetes, Type 2, Oral Hypoglycemic Agents  Renal/GU      Musculoskeletal   Abdominal   Peds  (+) premature delivery Hematology  (+) Blood dyscrasia, anemia ,   Anesthesia Other Findings   Reproductive/Obstetrics                           Anesthesia Physical Anesthesia Plan  ASA: III  Anesthesia Plan: General   Post-op Pain Management:    Induction: Intravenous  Airway Management Planned: LMA  Additional Equipment:   Intra-op Plan:   Post-operative Plan: Extubation in OR  Informed Consent: I have reviewed the patients History and Physical, chart, labs and discussed the procedure including the risks, benefits and alternatives for the proposed anesthesia with the patient or authorized representative who has indicated his/her understanding and acceptance.     Plan Discussed with:   Anesthesia Plan Comments: (Possible GOT if required.)        Anesthesia Quick Evaluation

## 2013-04-01 NOTE — Anesthesia Postprocedure Evaluation (Signed)
Anesthesia Post Note  Patient: [name]  Procedure(s) Performed: Procedure(s) (LRB): DILATATION AND CURETTAGE /HYSTEROSCOPY (N/A)  Anesthesia type: General  Patient location: PACU  Post pain: Pain level controlled  Post assessment: Post-op Vital signs reviewed, Patient's Cardiovascular Status Stable, Respiratory Function Stable, Patent Airway, No signs of Nausea or vomiting and Pain level controlled  Last Vitals:  Filed Vitals:   04/01/13 0900  BP: 136/78  Pulse:   Temp: 36.5 C  Resp: 22    Post vital signs: Reviewed and stable Heart rate 74  Level of consciousness: awake and alert   Complications: No apparent anesthesia complications

## 2013-04-01 NOTE — H&P (Signed)
Preoperative History and Physical  [name] is a 52 y.o. No obstetric history on file. with Patient's last menstrual period was 04/01/2013. admitted for a hysteroscopy and uterine curettage.  The patient was seen by Shawna Clamp for her yearly exam  Patient has been having increasing problems with vaginal bleeding, bleeding for 2 weeks off one week for the past couple of months  About 4 years ago she actually went a year with no bleeding but then began to have periods again  Dr. Mora Appl placed her on cyclical Provera which managed her bleeding just fine  However she discontinue that therapy  She had a sonogram today which showed fibroids which are chronic compared to a sonogram from 2006  And an endometrial stripe of 26 mm homogeneous  As a result the to the significant bleeding and the significantly thickened endometrium she needs a formal endometrial evaluation with hysteroscopy and uterine curettage in the OR  Is tentatively scheduled for October 24 patient will call back if it's a problem with her work schedule   PMH:    Past Medical History  Diagnosis Date  . Diabetes mellitus without complication   . Hypertension   . Asthma     PSH:     Past Surgical History  Procedure Laterality Date  . Cholecystectomy    . Nasal polyp surgery    . Carpal tunnel release Right     POb/GynH:      OB History   Grav Para Term Preterm Abortions TAB SAB Ect Mult Living                  SH:   History  Substance Use Topics  . Smoking status: Former Smoker -- 0.50 packs/day for 20 years    Types: Cigarettes    Quit date: 03/25/1997  . Smokeless tobacco: Not on file  . Alcohol Use: No    FH:    Family History  Problem Relation Age of Onset  . Diabetes Mother   . Kidney disease Mother   . Diabetes Father   . Cancer Father     pancreatic  . Diabetes Sister   . Kidney disease Sister   . Diabetes Maternal Grandmother   . Asthma Maternal Grandmother   . COPD Maternal  Grandmother     bronchitis  . Hypertension Maternal Grandmother   . Thyroid disease Maternal Grandmother      Allergies: No Known Allergies  Medications:      Current facility-administered medications:ceFAZolin (ANCEF) IVPB 2 g/50 mL premix, 2 g, Intravenous, On Call to OR, Lazaro Arms, MD;  lactated ringers infusion, , Intravenous, Continuous, Laurene Footman, MD, Last Rate: 75 mL/hr at 04/01/13 0724;  midazolam (VERSED) injection 1-2 mg, 1-2 mg, Intravenous, Q5 Min x 3 PRN, Laurene Footman, MD, 2 mg at 04/01/13 0725  Review of Systems:   Review of Systems  Constitutional: Negative for fever, chills, weight loss, malaise/fatigue and diaphoresis.  HENT: Negative for hearing loss, ear pain, nosebleeds, congestion, sore throat, neck pain, tinnitus and ear discharge.   Eyes: Negative for blurred vision, double vision, photophobia, pain, discharge and redness.  Respiratory: Negative for cough, hemoptysis, sputum production, shortness of breath, wheezing and stridor.   Cardiovascular: Negative for chest pain, palpitations, orthopnea, claudication, leg swelling and PND.  Gastrointestinal: Negative for abdominal pain. Negative for heartburn, nausea, vomiting, diarrhea, constipation, blood in stool and melena.  Genitourinary: Negative for dysuria, urgency, frequency, hematuria and flank pain.  Musculoskeletal: Negative for myalgias, back  pain, joint pain and falls.  Skin: Negative for itching and rash.  Neurological: Negative for dizziness, tingling, tremors, sensory change, speech change, focal weakness, seizures, loss of consciousness, weakness and headaches.  Endo/Heme/Allergies: Negative for environmental allergies and polydipsia. Does not bruise/bleed easily.  Psychiatric/Behavioral: Negative for depression, suicidal ideas, hallucinations, memory loss and substance abuse. The patient is not nervous/anxious and does not have insomnia.      PHYSICAL EXAM:  Blood pressure 159/85, pulse 88,  temperature 98.4 F (36.9 C), temperature source Oral, resp. rate 14, height 5\' 1"  (1.549 m), weight 253 lb (114.76 kg), last menstrual period 04/01/2013, SpO2 97.00%.    Vitals reviewed. Constitutional: She is oriented to person, place, and time. She appears well-developed and well-nourished.  HENT:  Head: Normocephalic and atraumatic.  Right Ear: External ear normal.  Left Ear: External ear normal.  Nose: Nose normal.  Mouth/Throat: Oropharynx is clear and moist.  Eyes: Conjunctivae and EOM are normal. Pupils are equal, round, and reactive to light. Right eye exhibits no discharge. Left eye exhibits no discharge. No scleral icterus.  Neck: Normal range of motion. Neck supple. No tracheal deviation present. No thyromegaly present.  Cardiovascular: Normal rate, regular rhythm, normal heart sounds and intact distal pulses.  Exam reveals no gallop and no friction rub.   No murmur heard. Respiratory: Effort normal and breath sounds normal. No respiratory distress. She has no wheezes. She has no rales. She exhibits no tenderness.  GI: Soft. Bowel sounds are normal. She exhibits no distension and no mass. There is tenderness. There is no rebound and no guarding.  Genitourinary:       Vulva is normal without lesions Vagina is pink moist without discharge Cervix normal in appearance and pap is normal Uterus is normal with ES 26 mm Adnexa is negative with normal sized ovaries by sonogram  Musculoskeletal: Normal range of motion. She exhibits no edema and no tenderness.  Neurological: She is alert and oriented to person, place, and time. She has normal reflexes. She displays normal reflexes. No cranial nerve deficit. She exhibits normal muscle tone. Coordination normal.  Skin: Skin is warm and dry. No rash noted. No erythema. No pallor.  Psychiatric: She has a normal mood and affect. Her behavior is normal. Judgment and thought content normal.    Labs: Results for orders placed during the  hospital encounter of 03/25/13 (from the past 336 hour(s))  CBC   Collection Time    03/25/13  2:45 PM      Result Value Range   WBC 8.6  4.0 - 10.5 K/uL   RBC 4.45  3.87 - 5.11 MIL/uL   Hemoglobin 10.3 (*) 12.0 - 15.0 g/dL   HCT 16.1 (*) 09.6 - 04.5 %   MCV 74.2 (*) 78.0 - 100.0 fL   MCH 23.1 (*) 26.0 - 34.0 pg   MCHC 31.2  30.0 - 36.0 g/dL   RDW 40.9  81.1 - 91.4 %   Platelets 367  150 - 400 K/uL  COMPREHENSIVE METABOLIC PANEL   Collection Time    03/25/13  2:45 PM      Result Value Range   Sodium 135  135 - 145 mEq/L   Potassium 4.3  3.5 - 5.1 mEq/L   Chloride 98  96 - 112 mEq/L   CO2 26  19 - 32 mEq/L   Glucose, Bld 119 (*) 70 - 99 mg/dL   BUN 10  6 - 23 mg/dL   Creatinine, Ser 7.82  0.50 -  1.10 mg/dL   Calcium 9.9  8.4 - 16.1 mg/dL   Total Protein 6.9  6.0 - 8.3 g/dL   Albumin 3.5  3.5 - 5.2 g/dL   AST 27  0 - 37 U/L   ALT 21  0 - 35 U/L   Alkaline Phosphatase 62  39 - 117 U/L   Total Bilirubin <0.1 (*) 0.3 - 1.2 mg/dL   GFR calc non Af Amer >90  >90 mL/min   GFR calc Af Amer >90  >90 mL/min  HCG, QUANTITATIVE, PREGNANCY   Collection Time    03/25/13  2:45 PM      Result Value Range   hCG, Beta Chain, Quant, S <1  <5 mIU/mL  URINALYSIS, ROUTINE W REFLEX MICROSCOPIC   Collection Time    03/25/13  2:45 PM      Result Value Range   Color, Urine YELLOW  YELLOW   APPearance CLEAR  CLEAR   Specific Gravity, Urine 1.020  1.005 - 1.030   pH 7.5  5.0 - 8.0   Glucose, UA NEGATIVE  NEGATIVE mg/dL   Hgb urine dipstick NEGATIVE  NEGATIVE   Bilirubin Urine NEGATIVE  NEGATIVE   Ketones, ur NEGATIVE  NEGATIVE mg/dL   Protein, ur NEGATIVE  NEGATIVE mg/dL   Urobilinogen, UA 0.2  0.0 - 1.0 mg/dL   Nitrite NEGATIVE  NEGATIVE   Leukocytes, UA NEGATIVE  NEGATIVE    EKG: No orders found for this or any previous visit.  Imaging Studies: US Transvaginal Non-ob  2013-03-31   GYNECOLOGIC SONOGRAM   Mandie L Beggs is a 52 y.o. for a pelvic sonogram for post menopausal   bleeding.  Uterus                      13.3 x 9.0 x 7.5 cm, with multiple fibroids  noted, largest=30mm and next largest=23mm both are within fundus of uterus  Endometrium          26 mm, symmetrical, no obvious mass noted within  endom cavity  Right ovary             3.8 x 2.2 x 1.9 cm  Left ovary                3.6 x 2.9 x 2.6 cm, with 2.8 x 2.1cm simple  cystic mass noted   No free fluid noted within pelvis Sub-Optimal visualization due to difficulty to evaluate anatomy due to  patient body habitus  Technician Comments:  Multiple fibroids noted, ENDOM=19mm, LT ov simple cyst noted=2.8 x 2.1cm   Chari Manning 2013/03/31 3:05 PM  1.  Pathologically thickened endometrium, homogenous 2.  Fibroids  Recommed formal endometrial evaluation with hysteroscopy and uterine  curettage    Kunaal Walkins H 2013-03-31 3:47 PM          Assessment:   Post menopausal bleeding Thickened endometrium   Patient Active Problem List   Diagnosis Date Noted  . Thickened endometrium 2013/03/31  . Hypertension 03/07/2013  . Diabetes mellitus, type II 03/07/2013  . Asthma 03/07/2013  . Postmenopausal bleeding 03/07/2013  . Morbid obesity with BMI of 45.0-49.9, adult 03/07/2013    Plan: Hysterosocpy and uterine curettage  Deleon Passe H 04/01/2013 7:52 AM

## 2013-04-01 NOTE — Transfer of Care (Signed)
Immediate Anesthesia Transfer of Care Note  Patient: [name]  Procedure(s) Performed: Procedure(s) (LRB): DILATATION AND CURETTAGE /HYSTEROSCOPY (N/A)  Patient Location: PACU  Anesthesia Type: General  Level of Consciousness: awake  Airway & Oxygen Therapy: Patient Spontanous Breathing and non-rebreather face mask  Post-op Assessment: Report given to PACU RN, Post -op Vital signs reviewed and stable and Patient moving all extremities  Post vital signs: Reviewed and stable  Complications: No apparent anesthesia complications

## 2013-04-01 NOTE — Anesthesia Procedure Notes (Signed)
Procedure Name: LMA Insertion Date/Time: 04/01/2013 8:13 AM Performed by: Franco Nones Pre-anesthesia Checklist: Patient identified, Patient being monitored, Emergency Drugs available, Timeout performed and Suction available Patient Re-evaluated:Patient Re-evaluated prior to inductionOxygen Delivery Method: Circle System Utilized Preoxygenation: Pre-oxygenation with 100% oxygen Intubation Type: IV induction Ventilation: Mask ventilation without difficulty LMA: LMA inserted LMA Size: 4.0 Number of attempts: 1 Placement Confirmation: positive ETCO2 and breath sounds checked- equal and bilateral

## 2013-04-04 DIAGNOSIS — Z029 Encounter for administrative examinations, unspecified: Secondary | ICD-10-CM

## 2013-04-06 ENCOUNTER — Encounter (HOSPITAL_COMMUNITY): Payer: Self-pay | Admitting: Obstetrics & Gynecology

## 2013-04-07 NOTE — Op Note (Signed)
Preoperative diagnosis: Postmenopausal bleeding with thickened endometrium  Postoperative diagnoses: Same as above   Procedure: Hysteroscopy, uterine curettage,   Surgeon: [name]  Anesthesia: Laryngeal mask airway  Findings: The endometrium was normal. There were no fibroid or other abnormalities.  Description of operation: The patient was taken to the operating room and placed in the supine position. She underwent general anesthesia using the laryngeal mask airway. She was placed in the dorsal lithotomy position and prepped and draped in the usual sterile fashion. A Graves speculum was placed and the anterior cervical lip was grasped with a single-tooth tenaculum. The cervix was dilated serially to allow passage of the hysteroscope. Diagnostic hysteroscopy was performed and was found to be normal. A vigorous uterine curettage was then performed and all tissue sent to pathology for evaluation. The patient was awakened from anesthesia and taken to the recovery room in good stable condition all counts were correct. She received 2 g of Ancef and 30 mg of Toradol preoperatively. She will be discharged from the recovery room and followed up in the office in 1- 2 weeks.  [name] 04/07/2013 8:57 AM

## 2013-04-08 ENCOUNTER — Ambulatory Visit (INDEPENDENT_AMBULATORY_CARE_PROVIDER_SITE_OTHER): Payer: 59 | Admitting: Obstetrics & Gynecology

## 2013-04-08 ENCOUNTER — Encounter (INDEPENDENT_AMBULATORY_CARE_PROVIDER_SITE_OTHER): Payer: Self-pay

## 2013-04-08 ENCOUNTER — Encounter: Payer: Self-pay | Admitting: Obstetrics & Gynecology

## 2013-04-08 VITALS — BP 160/80 | Wt 259.0 lb

## 2013-04-08 DIAGNOSIS — R9389 Abnormal findings on diagnostic imaging of other specified body structures: Secondary | ICD-10-CM

## 2013-04-08 DIAGNOSIS — Z9889 Other specified postprocedural states: Secondary | ICD-10-CM

## 2013-04-08 DIAGNOSIS — N950 Postmenopausal bleeding: Secondary | ICD-10-CM

## 2013-04-08 MED ORDER — MEDROXYPROGESTERONE ACETATE 10 MG PO TABS: 10.0000 mg | ORAL_TABLET | Freq: Every day | ORAL | Status: DC

## 2013-04-08 NOTE — Progress Notes (Signed)
Patient ID: [name], female   DOB: [birth year], 52 y.o.   MRN: [id number] Pathology returned as benign disordered proliferative endometrium without atypia or hyperplasia  Pt doin well no complaints, still bleeding   Exam normal  Obviously pt is stimulating the endometrium and needs suppression,  For now we will use cyclical progesterone to serve as our predictable suppression,  We may transition in the future to daily regime but for now take 10 mg first 10 days of each month  Follow up in 6 months

## 2013-04-11 ENCOUNTER — Encounter: Payer: 59 | Admitting: Obstetrics & Gynecology

## 2013-04-25 ENCOUNTER — Telehealth: Payer: Self-pay | Admitting: *Deleted

## 2013-04-25 NOTE — Telephone Encounter (Signed)
PT just wanted to let Dr. Despina Hidden know that the same bleeding she was having before is back again. Same bleeding, cramping, and large blood clots. Pt states that she bled for a couple days after her procedure and now has gotten worse. PT states that she had a hysteroscopy D&C on 04/01/13. Pt switched up front to make appointment with Dr. Despina Hidden.

## 2013-04-26 ENCOUNTER — Encounter (INDEPENDENT_AMBULATORY_CARE_PROVIDER_SITE_OTHER): Payer: Self-pay

## 2013-04-26 ENCOUNTER — Ambulatory Visit (INDEPENDENT_AMBULATORY_CARE_PROVIDER_SITE_OTHER): Payer: 59 | Admitting: Obstetrics & Gynecology

## 2013-04-26 ENCOUNTER — Encounter: Payer: Self-pay | Admitting: Obstetrics & Gynecology

## 2013-04-26 VITALS — BP 162/88 | Ht 61.0 in | Wt 255.0 lb

## 2013-04-26 DIAGNOSIS — N950 Postmenopausal bleeding: Secondary | ICD-10-CM

## 2013-04-26 MED ORDER — MEGESTROL ACETATE 40 MG PO TABS: ORAL_TABLET | ORAL | Status: DC

## 2013-04-26 NOTE — Progress Notes (Signed)
Patient ID: [name], female   DOB: December 26, 1960, 52 y.o.   MRN: [id number] Pt having heavy bleeding on provera Pathology benign on curettage  Switch to megace

## 2013-09-12 ENCOUNTER — Other Ambulatory Visit: Payer: Self-pay | Admitting: Obstetrics & Gynecology

## 2014-11-14 ENCOUNTER — Other Ambulatory Visit (HOSPITAL_COMMUNITY): Payer: Self-pay | Admitting: Internal Medicine

## 2014-11-14 DIAGNOSIS — Z1231 Encounter for screening mammogram for malignant neoplasm of breast: Secondary | ICD-10-CM

## 2014-11-24 ENCOUNTER — Encounter: Payer: Self-pay | Admitting: *Deleted

## 2014-11-27 ENCOUNTER — Encounter: Payer: Self-pay | Admitting: *Deleted

## 2014-12-12 ENCOUNTER — Ambulatory Visit (INDEPENDENT_AMBULATORY_CARE_PROVIDER_SITE_OTHER): Payer: BLUE CROSS/BLUE SHIELD | Admitting: Obstetrics & Gynecology

## 2014-12-12 ENCOUNTER — Encounter: Payer: Self-pay | Admitting: Obstetrics & Gynecology

## 2014-12-12 ENCOUNTER — Other Ambulatory Visit (HOSPITAL_COMMUNITY)
Admission: RE | Admit: 2014-12-12 | Discharge: 2014-12-12 | Disposition: A | Discharge status: Home / Self Care | Payer: BLUE CROSS/BLUE SHIELD | Source: Ambulatory Visit | Attending: Obstetrics & Gynecology | Admitting: Obstetrics & Gynecology

## 2014-12-12 VITALS — BP 130/80 | HR 80 | Ht 61.0 in | Wt 250.0 lb

## 2014-12-12 DIAGNOSIS — Z01419 Encounter for gynecological examination (general) (routine) without abnormal findings: Secondary | ICD-10-CM | POA: Insufficient documentation

## 2014-12-12 DIAGNOSIS — N950 Postmenopausal bleeding: Secondary | ICD-10-CM

## 2014-12-12 NOTE — Progress Notes (Signed)
Patient ID: [name], female   DOB: 08/17/1960, 54 y.o.   MRN: [id number] Subjective:     [name] is a 54 y.o. female here for a routine exam.  Patient's last menstrual period was 11/15/2014. No obstetric history on file. Birth Control Method:  BTL Menstrual Calendar(currently): light irregular  Current complaints: none.   Current acute medical issues:  none   Recent Gynecologic History Patient's last menstrual period was 11/15/2014. Last Pap: 2014,  normal Last mammogram: *2015**,  normal  Past Medical History  Diagnosis Date  . Diabetes mellitus without complication   . Hypertension   . Asthma   . Allergy   . Morbid obesity due to excess calories     Past Surgical History  Procedure Laterality Date  . Cholecystectomy    . Nasal polyp surgery    . Carpal tunnel release Right   . Hysteroscopy w/d&c N/A 04/01/2013    Procedure: DILATATION AND CURETTAGE /HYSTEROSCOPY;  Surgeon: Florian Buff, MD;  Location: [facility];  Service: Gynecology;  Laterality: N/A;    OB History    No data available      History   Social History  . Marital Status: Married    Spouse Name: N/A  . Number of Children: N/A  . Years of Education: N/A   Social History Main Topics  . Smoking status: Former Smoker -- 0.50 packs/day for 20 years    Types: Cigarettes    Quit date: 03/25/1997  . Smokeless tobacco: Never Used  . Alcohol Use: No  . Drug Use: No  . Sexual Activity: Yes    Birth Control/ Protection: None   Other Topics Concern  . None   Social History Narrative    Family History  Problem Relation Age of Onset  . Diabetes Mother   . Kidney disease Mother   . Other Mother     renal failure  . Hypertension Mother   . Diabetes Father   . Cancer Father     pancreatic  . Diabetes Sister   . Kidney disease Sister   . Diabetes Maternal Grandmother   . Asthma Maternal Grandmother   . COPD Maternal Grandmother     bronchitis  . Hypertension Maternal Grandmother   .  Thyroid disease Maternal Grandmother      Current outpatient prescriptions:  .  aspirin 81 MG tablet, Take 81 mg by mouth daily., Disp: , Rfl:  .  Fluticasone-Salmeterol (ADVAIR) 250-50 MCG/DOSE AEPB, Inhale 1 puff into the lungs 2 (two) times daily. , Disp: , Rfl:  .  ipratropium-albuterol (DUONEB) 0.5-2.5 (3) MG/3ML SOLN, Take 3 mLs by nebulization every 4 (four) hours as needed (Shortness of Breath). , Disp: , Rfl:  .  lisinopril (PRINIVIL,ZESTRIL) 40 MG tablet, Take 40 mg by mouth daily., Disp: , Rfl:  .  loratadine (CLARITIN) 10 MG tablet, Take 10 mg by mouth daily., Disp: , Rfl:  .  metFORMIN (GLUCOPHAGE) 500 MG tablet, Take 1,000 mg by mouth 2 (two) times daily with a meal. , Disp: , Rfl:  .  Multiple Vitamin (MULTIVITAMIN WITH MINERALS) TABS tablet, Take 1 tablet by mouth daily., Disp: , Rfl:  .  TRAMADOL-ACETAMINOPHEN PO, Take by mouth 2 (two) times daily as needed., Disp: , Rfl:  .  baclofen (LIORESAL) 10 MG tablet, Take 10 mg by mouth daily., Disp: , Rfl:  .  HYDROcodone-acetaminophen (NORCO/VICODIN) 5-325 MG per tablet, Take 1 tablet by mouth every 6 (six) hours as needed for pain. (Patient  not taking: Reported on 12/12/2014), Disp: 30 tablet, Rfl: 0 .  ketorolac (TORADOL) 10 MG tablet, Take 1 tablet (10 mg total) by mouth every 8 (eight) hours as needed for pain. (Patient not taking: Reported on 12/12/2014), Disp: 15 tablet, Rfl: 0 .  medroxyPROGESTERone (PROVERA) 10 MG tablet, Take 1 tablet (10 mg total) by mouth daily. (Patient not taking: Reported on 12/12/2014), Disp: 30 tablet, Rfl: 11 .  megestrol (MEGACE) 40 MG tablet, TAKE 3 TABLETS DAILY FOR 5 DAYS, THEN 2 TABLETS DAILY FOR 5 DAYS, THEN 1 TABLET DAILY (Patient not taking: Reported on 12/12/2014), Disp: 45 tablet, Rfl: 11 .  ondansetron (ZOFRAN) 8 MG tablet, Take 1 tablet (8 mg total) by mouth every 8 (eight) hours as needed for nausea. (Patient not taking: Reported on 12/12/2014), Disp: 24 tablet, Rfl: 1  Review of Systems  Review  of Systems  Constitutional: Negative for fever, chills, weight loss, malaise/fatigue and diaphoresis.  HENT: Negative for hearing loss, ear pain, nosebleeds, congestion, sore throat, neck pain, tinnitus and ear discharge.   Eyes: Negative for blurred vision, double vision, photophobia, pain, discharge and redness.  Respiratory: Negative for cough, hemoptysis, sputum production, shortness of breath, wheezing and stridor.   Cardiovascular: Negative for chest pain, palpitations, orthopnea, claudication, leg swelling and PND.  Gastrointestinal: negative for abdominal pain. Negative for heartburn, nausea, vomiting, diarrhea, constipation, blood in stool and melena.  Genitourinary: Negative for dysuria, urgency, frequency, hematuria and flank pain.  Musculoskeletal: Negative for myalgias, back pain, joint pain and falls.  Skin: Negative for itching and rash.  Neurological: Negative for dizziness, tingling, tremors, sensory change, speech change, focal weakness, seizures, loss of consciousness, weakness and headaches.  Endo/Heme/Allergies: Negative for environmental allergies and polydipsia. Does not bruise/bleed easily.  Psychiatric/Behavioral: Negative for depression, suicidal ideas, hallucinations, memory loss and substance abuse. The patient is not nervous/anxious and does not have insomnia.        Objective:  Blood pressure 130/80, pulse 80, height 5\' 1"  (1.549 m), weight 250 lb (113.399 kg), last menstrual period 11/15/2014.   Physical Exam  Vitals reviewed. Constitutional: She is oriented to person, place, and time. She appears well-developed and well-nourished.  HENT:  Head: Normocephalic and atraumatic.        Right Ear: External ear normal.  Left Ear: External ear normal.  Nose: Nose normal.  Mouth/Throat: Oropharynx is clear and moist.  Eyes: Conjunctivae and EOM are normal. Pupils are equal, round, and reactive to light. Right eye exhibits no discharge. Left eye exhibits no discharge.  No scleral icterus.  Neck: Normal range of motion. Neck supple. No tracheal deviation present. No thyromegaly present.  Cardiovascular: Normal rate, regular rhythm, normal heart sounds and intact distal pulses.  Exam reveals no gallop and no friction rub.   No murmur heard. Respiratory: Effort normal and breath sounds normal. No respiratory distress. She has no wheezes. She has no rales. She exhibits no tenderness.  GI: Soft. Bowel sounds are normal. She exhibits no distension and no mass. There is no tenderness. There is no rebound and no guarding.  Genitourinary:  Breasts no masses skin changes or nipple changes bilaterally      Vulva is normal without lesions Vagina is pink moist without discharge Cervix normal in appearance and pap is done Uterus is normal size shape and contour Adnexa is negative with normal sized ovaries  Rectal    hemoccult negative, normal tone, no masses } Musculoskeletal: Normal range of motion. She exhibits no edema and no tenderness.  Neurological: She is alert and oriented to person, place, and time. She has normal reflexes. She displays normal reflexes. No cranial nerve deficit. She exhibits normal muscle tone. Coordination normal.  Skin: Skin is warm and dry. No rash noted. No erythema. No pallor.  Psychiatric: She has a normal mood and affect. Her behavior is normal. Judgment and thought content normal.       Assessment:    Healthy female exam.    Plan:    Mammogram ordered. Follow up in: 1 year. prn

## 2014-12-13 ENCOUNTER — Ambulatory Visit (HOSPITAL_COMMUNITY)
Admission: RE | Admit: 2014-12-13 | Discharge: 2014-12-13 | Disposition: A | Discharge status: Home / Self Care | Payer: BLUE CROSS/BLUE SHIELD | Source: Ambulatory Visit | Attending: Internal Medicine | Admitting: Internal Medicine

## 2014-12-13 DIAGNOSIS — Z1231 Encounter for screening mammogram for malignant neoplasm of breast: Secondary | ICD-10-CM | POA: Insufficient documentation

## 2014-12-14 ENCOUNTER — Other Ambulatory Visit: Payer: Self-pay

## 2014-12-14 ENCOUNTER — Encounter: Payer: Self-pay | Admitting: Gastroenterology

## 2014-12-14 ENCOUNTER — Ambulatory Visit (INDEPENDENT_AMBULATORY_CARE_PROVIDER_SITE_OTHER): Payer: BLUE CROSS/BLUE SHIELD | Admitting: Gastroenterology

## 2014-12-14 VITALS — BP 146/81 | HR 83 | Temp 97.3°F | Ht 61.0 in | Wt 249.0 lb

## 2014-12-14 DIAGNOSIS — Z1211 Encounter for screening for malignant neoplasm of colon: Secondary | ICD-10-CM | POA: Diagnosis not present

## 2014-12-14 MED ORDER — PEG-KCL-NACL-NASULF-NA ASC-C 100 G PO SOLR: 1.0000 | ORAL | Status: DC

## 2014-12-14 MED ORDER — DICYCLOMINE HCL 10 MG PO CAPS: 10.0000 mg | ORAL_CAPSULE | Freq: Three times a day (TID) | ORAL | Status: DC

## 2014-12-14 NOTE — Progress Notes (Signed)
Primary Care Physician:  Rosita Fire, MD Primary Gastroenterologist:  Dr.   Laurel Dimmer Complaint  Patient presents with  . set up TCS    HPI:   [name] is a 54 y.o. female presenting today at the request of Dr. Legrand Rams for initial screening colonoscopy.   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 Been on vacation all week and hasn't had any problems this week with BMs.   Prior to vacation would have intermittent loose stool, coming/going. Has associated RLQ discomfort with improvement after bowel movement. Some days at a time, some none at all. Worsened during time of cycle. Normal BMs otherwise. No rectal bleeding. Postprandial. Unable to pinpoint a type of food. If drinks a pepsi at work, it will happen.    Past Medical History  Diagnosis Date  . Diabetes mellitus without complication   . Hypertension   . Asthma   . Allergy   . Morbid obesity due to excess calories     Past Surgical History  Procedure Laterality Date  . Cholecystectomy    . Nasal polyp surgery    . Carpal tunnel release Right   . Hysteroscopy w/d&c N/A 04/01/2013    Procedure: DILATATION AND CURETTAGE /HYSTEROSCOPY;  Surgeon: Florian Buff, MD;  Location: [facility];  Service: Gynecology;  Laterality: N/A;    Current Outpatient Prescriptions  Medication Sig Dispense Refill  . amLODipine (NORVASC) 5 MG tablet Take 5 mg by mouth daily.  0  . aspirin 81 MG tablet Take 81 mg by mouth daily.    Marland Kitchen atorvastatin (LIPITOR) 10 MG tablet Take 10 mg by mouth daily.  0  . Fluticasone-Salmeterol (ADVAIR) 250-50 MCG/DOSE AEPB Inhale 1 puff into the lungs 2 (two) times daily.     . hydrochlorothiazide (HYDRODIURIL) 25 MG tablet   0  . ipratropium-albuterol (DUONEB) 0.5-2.5 (3) MG/3ML SOLN Take 3 mLs by nebulization every 4 (four) hours as needed (Shortness of Breath).     Marland Kitchen ketorolac (TORADOL) 10 MG tablet Take 1 tablet (10 mg total) by mouth every 8 (eight) hours as needed for pain. 15 tablet 0  .  lisinopril (PRINIVIL,ZESTRIL) 40 MG tablet Take 40 mg by mouth daily.    Marland Kitchen loratadine (CLARITIN) 10 MG tablet Take 10 mg by mouth daily.    . metFORMIN (GLUCOPHAGE) 500 MG tablet Take 1,000 mg by mouth 2 (two) times daily with a meal.     . Multiple Vitamin (MULTIVITAMIN WITH MINERALS) TABS tablet Take 1 tablet by mouth daily.    . TRAMADOL-ACETAMINOPHEN PO Take by mouth 2 (two) times daily as needed.     No current facility-administered medications for this visit.    Allergies as of 12/14/2014  . (No Known Allergies)    Family History  Problem Relation Age of Onset  . Diabetes Mother   . Kidney disease Mother   . Other Mother     renal failure  . Hypertension Mother   . Diabetes Father   . Cancer Father     pancreatic  . Diabetes Sister   . Kidney disease Sister   . Diabetes Maternal Grandmother   . Asthma Maternal Grandmother   . COPD Maternal Grandmother     bronchitis  . Hypertension Maternal Grandmother   . Thyroid disease Maternal Grandmother   . Colon cancer Neg Hx     History   Social History  . Marital Status: Married    Spouse Name: N/A  . Number  of Children: N/A  . Years of Education: N/A   Occupational History  . Not on file.   Social History Main Topics  . Smoking status: Former Smoker -- 0.50 packs/day for 20 years    Types: Cigarettes    Quit date: 03/25/1997  . Smokeless tobacco: Never Used  . Alcohol Use: No     Comment: occasional   . Drug Use: No  . Sexual Activity: Yes    Birth Control/ Protection: None   Other Topics Concern  . Not on file   Social History Narrative    Review of Systems: Gen: Denies any fever, chills, fatigue, weight loss, lack of appetite.  CV: Denies chest pain, heart palpitations, peripheral edema, syncope.  Resp: Denies shortness of breath at rest or with exertion. Denies wheezing or cough.  GI: see HPI GU : Denies urinary burning, urinary frequency, urinary hesitancy MS: aches/pains Derm: Denies rash,  itching, dry skin Psych: Denies depression, anxiety, memory loss, and confusion Heme: Denies bruising, bleeding, and enlarged lymph nodes.  Physical Exam: BP 146/81 mmHg  Pulse 83  Temp(Src) 97.3 F (36.3 C)  Ht 5\' 1"  (1.549 m)  Wt 249 lb (112.946 kg)  BMI 47.07 kg/m2  LMP 11/15/2014 General:   Alert and oriented. Pleasant and cooperative. Well-nourished and well-developed.  Head:  Normocephalic and atraumatic. Eyes:  Without icterus, sclera clear and conjunctiva pink.  Ears:  Normal auditory acuity. Nose:  No deformity, discharge,  or lesions. Mouth:  No deformity or lesions, oral mucosa pink.  Lungs:  Clear to auscultation bilaterally. No wheezes, rales, or rhonchi. No distress.  Heart:  S1, S2 present without murmurs appreciated.  Abdomen:  +BS, soft, non-tender and non-distended. No HSM noted. No guarding or rebound. No masses appreciated. Possible umbilical hernia.  Rectal:  Deferred  Msk:  Symmetrical without gross deformities. Normal posture. Extremities:  Without edema. Neurologic:  Alert and  oriented x4;  grossly normal neurologically. Psych:  Alert and cooperative. Normal mood and affect.

## 2014-12-14 NOTE — Patient Instructions (Addendum)
We have scheduled you for a colonoscopy with Dr. Oneida Alar.  You may take Bentyl 1 capsule as needed for loose stool and abdominal cramping but no more than 4 in a day. Watch for dry mouth, constipation, dizziness.   Follow the diet attached. Avoid the trigger foods.   Diet and Irritable Bowel Syndrome  No cure has been found for irritable bowel syndrome (IBS). Many options are available to treat the symptoms. Your caregiver will give you the best treatments available for your symptoms. He or she will also encourage you to manage stress and to make changes to your diet. You need to work with your caregiver and Registered Dietician to find the best combination of medicine, diet, counseling, and support to control your symptoms. The following are some diet suggestions. FOODS THAT MAKE IBS WORSE  Fatty foods, such as [place] fries.  Milk products, such as cheese or ice cream.  Chocolate.  Alcohol.  Caffeine (found in coffee and some sodas).  Carbonated drinks, such as soda. If certain foods cause symptoms, you should eat less of them or stop eating them. FOOD JOURNAL   Keep a journal of the foods that seem to cause distress. Write down:  What you are eating during the day and when.  What problems you are having after eating.  When the symptoms occur in relation to your meals.  What foods always make you feel badly.  Take your notes with you to your caregiver to see if you should stop eating certain foods. FOODS THAT MAKE IBS BETTER Fiber reduces IBS symptoms, especially constipation, because it makes stools soft, bulky, and easier to pass. Fiber is found in bran, bread, cereal, beans, fruit, and vegetables. Examples of foods with fiber include:  Apples.  Peaches.  Pears.  Berries.  Figs.  Broccoli, raw.  Cabbage.  Carrots.  Raw peas.  Kidney beans.  Lima beans.  Whole-grain bread.  Whole-grain cereal. Add foods with fiber to your diet a little at a time. This  will let your body get used to them. Too much fiber at once might cause gas and swelling of your abdomen. This can trigger symptoms in a person with IBS. Caregivers usually recommend a diet with enough fiber to produce soft, painless bowel movements. High fiber diets may cause gas and bloating. However, these symptoms often go away within a few weeks, as your body adjusts. In many cases, dietary fiber may lessen IBS symptoms, particularly constipation. However, it may not help pain or diarrhea. High fiber diets keep the colon mildly enlarged (distended) with the added fiber. This may help prevent spasms in the colon. Some forms of fiber also keep water in the stool, thereby preventing hard stools that are difficult to pass.  Besides telling you to eat more foods with fiber, your caregiver may also tell you to get more fiber by taking a fiber pill or drinking water mixed with a special high fiber powder. An example of this is a natural fiber laxative containing psyllium seed.  TIPS  Large meals can cause cramping and diarrhea in people with IBS. If this happens to you, try eating 4 or 5 small meals a day, or try eating less at each of your usual 3 meals. It may also help if your meals are low in fat and high in carbohydrates. Examples of carbohydrates are pasta, rice, whole-grain breads and cereals, fruits, and vegetables.  If dairy products cause your symptoms to flare up, you can try eating less of those  foods. You might be able to handle yogurt better than other dairy products, because it contains bacteria that helps with digestion. Dairy products are an important source of calcium and other nutrients. If you need to avoid dairy products, be sure to talk with a Registered Dietitian about getting these nutrients through other food sources.  Drink enough water and fluids to keep your urine clear or pale yellow. This is important, especially if you have diarrhea. FOR MORE INFORMATION  International  Foundation for Functional Gastrointestinal Disorders: www.iffgd.org  National Digestive Diseases Information Clearinghouse: digestive.AmenCredit.is Document Released: 08/16/2003 Document Revised: 08/18/2011 Document Reviewed: 08/26/2013 [facility] Patient Information 2015 [place], [place]. This information is not intended to replace advice given to you by your health care provider. Make sure you discuss any questions you have with your health care provider.

## 2014-12-15 LAB — CYTOLOGY - PAP

## 2014-12-21 NOTE — Assessment & Plan Note (Signed)
54 year old female with need for initial screening colonoscopy, overdue. Notes intermittent abdominal discomfort associated with loose stool that appears to be diet-related and worse with menstrual cycles. No concerning signs such as rectal bleeding, weight loss, or FH of colon cancer.   Trial Bentyl as needed Low-fat diet Proceed with colonoscopy with Dr. Oneida Alar in the near future. The risks, benefits, and alternatives have been discussed in detail with the patient. They state understanding and desire to proceed.

## 2014-12-22 NOTE — Progress Notes (Signed)
CC'ED TO PCP 

## 2014-12-26 ENCOUNTER — Encounter: Payer: Self-pay | Admitting: Obstetrics & Gynecology

## 2014-12-26 ENCOUNTER — Ambulatory Visit (INDEPENDENT_AMBULATORY_CARE_PROVIDER_SITE_OTHER): Payer: BLUE CROSS/BLUE SHIELD

## 2014-12-26 ENCOUNTER — Ambulatory Visit (INDEPENDENT_AMBULATORY_CARE_PROVIDER_SITE_OTHER): Payer: BLUE CROSS/BLUE SHIELD | Admitting: Obstetrics & Gynecology

## 2014-12-26 VITALS — BP 134/80 | HR 76 | Wt 252.0 lb

## 2014-12-26 DIAGNOSIS — N950 Postmenopausal bleeding: Secondary | ICD-10-CM

## 2014-12-26 MED ORDER — PROGESTERONE MICRONIZED 200 MG PO CAPS: 200.0000 mg | ORAL_CAPSULE | Freq: Every day | ORAL | Status: DC

## 2014-12-26 NOTE — Progress Notes (Signed)
Patient ID: [name], female   DOB: 26-May-1961, 54 y.o.   MRN: [id number]   Here to discuss results:   US Transvaginal Non-ob  12/26/2014   GYNECOLOGIC SONOGRAM   [name] is a 54 y.o. LMP 12/21/2014 for a pelvic sonogram for post  menopausal bleeding .  Uterus                      13.05 x 6.6 x 7.8 cm, heterogenous,anteverted  uterus w/mult fibroids,largest fibroid #1) fundal intramural 3.2 x 2.4 x   2.1cm,(#2)intramural fibroid ant mid uterus 2.3 x 1.9 x 1.7cm  Endometrium          2.8 mm, symmetrical, wnl  Right ovary             3.9 x 2.3 x 3.0 cm, rt ov simple dominate follicle  2.2 x 1.9 x 2.4OX,  Left ovary                3.4 x 1.9 x 3 cm, wnl    Technician Comments:  US PELVIC TA/TV: heterogenous,anteverted uterus w/mult fibroids,largest  fibroid #1) fundal intramural 3.2 x 2.4 x  2.1cm,(#2)intramural fibroid  ant mid uterus 2.3 x 1.9 x 1.7cm,EEC 2.51mm,normal lt ov,rt ov simple  dominate follicle 2.2 x 1.9 x 7.3ZH,GDJMEQA ultrasound because of pt body  habitus.    Amber Heide Guile 12/26/2014 4:33 PM  Clinical Impression and recommendations:  I have reviewed the sonogram results above, combined with the patient's  current clinical course, below are my impressions and any appropriate  recommendations for management based on the sonographic findings.  Specifically thin endometrium precludes pathology, has had biopsy before Small stable myomata Small clinically insignificant follicle   Treyshon Buchanon H 12/26/2014 5:05 PM    US Pelvis Complete  12/26/2014   GYNECOLOGIC SONOGRAM   [name] is a 54 y.o. LMP 12/21/2014 for a pelvic sonogram for post  menopausal bleeding .  Uterus                      13.05 x 6.6 x 7.8 cm, heterogenous,anteverted  uterus w/mult fibroids,largest fibroid #1) fundal intramural 3.2 x 2.4 x   2.1cm,(#2)intramural fibroid ant mid uterus 2.3 x 1.9 x 1.7cm  Endometrium          2.8 mm, symmetrical, wnl  Right ovary             3.9 x 2.3 x 3.0 cm, rt ov simple dominate follicle   2.2 x 1.9 x 8.3MH,  Left ovary                3.4 x 1.9 x 3 cm, wnl    Technician Comments:  US PELVIC TA/TV: heterogenous,anteverted uterus w/mult fibroids,largest  fibroid #1) fundal intramural 3.2 x 2.4 x  2.1cm,(#2)intramural fibroid  ant mid uterus 2.3 x 1.9 x 1.7cm,EEC 2.38mm,normal lt ov,rt ov simple  dominate follicle 2.2 x 1.9 x 9.6QI,WLNLGXQ ultrasound because of pt body  habitus.    Amber Heide Guile 12/26/2014 4:33 PM  Clinical Impression and recommendations:  I have reviewed the sonogram results above, combined with the patient's  current clinical course, below are my impressions and any appropriate  recommendations for management based on the sonographic findings.  Specifically thin endometrium precludes pathology, has had biopsy before Small stable myomata Small clinically insignificant follicle   Taryll Reichenberger H 12/26/2014 5:05 PM     Thin endometrium, negative pathology 2 years ago  Is still having some estrogenic stimulation of the endometrium so will cycle with prometrium 200 qhs for 10 days of each month  Follow up 6 months     Face to face time:  10 minutes  Greater than 50% of the visit time was spent in counseling and coordination of care with the patient.  The summary and outline of the counseling and care coordination is summarized in the note above.   All questions were answered.

## 2014-12-26 NOTE — Progress Notes (Signed)
US PELVIC TA/TV: heterogenous,anteverted uterus w/mult fibroids,largest fibroid #1) fundal intramural 3.2 x 2.4 x  2.1cm,(#2)intramural fibroid ant mid uterus 2.3 x 1.9 x 1.7cm,EEC 2.62mm,normal lt ov,rt ov simple dominate follicle 2.2 x 1.9 x 2.4MG,NOIBBCW ultrasound because of pt body habitus.

## 2015-01-12 ENCOUNTER — Ambulatory Visit (HOSPITAL_COMMUNITY)
Admission: RE | Admit: 2015-01-12 | Discharge: 2015-01-12 | Disposition: A | Discharge status: Home / Self Care | Payer: BLUE CROSS/BLUE SHIELD | Source: Ambulatory Visit | Attending: Gastroenterology | Admitting: Gastroenterology

## 2015-01-12 ENCOUNTER — Encounter (HOSPITAL_COMMUNITY)
Admission: RE | Disposition: A | Discharge status: Home / Self Care | Payer: Self-pay | Source: Ambulatory Visit | Attending: Gastroenterology

## 2015-01-12 ENCOUNTER — Encounter (HOSPITAL_COMMUNITY): Payer: Self-pay | Admitting: *Deleted

## 2015-01-12 DIAGNOSIS — E119 Type 2 diabetes mellitus without complications: Secondary | ICD-10-CM | POA: Diagnosis not present

## 2015-01-12 DIAGNOSIS — J449 Chronic obstructive pulmonary disease, unspecified: Secondary | ICD-10-CM | POA: Insufficient documentation

## 2015-01-12 DIAGNOSIS — K648 Other hemorrhoids: Secondary | ICD-10-CM | POA: Insufficient documentation

## 2015-01-12 DIAGNOSIS — Z1211 Encounter for screening for malignant neoplasm of colon: Secondary | ICD-10-CM | POA: Insufficient documentation

## 2015-01-12 DIAGNOSIS — I10 Essential (primary) hypertension: Secondary | ICD-10-CM | POA: Diagnosis not present

## 2015-01-12 DIAGNOSIS — K635 Polyp of colon: Secondary | ICD-10-CM | POA: Diagnosis not present

## 2015-01-12 DIAGNOSIS — Z87891 Personal history of nicotine dependence: Secondary | ICD-10-CM | POA: Insufficient documentation

## 2015-01-12 DIAGNOSIS — Q438 Other specified congenital malformations of intestine: Secondary | ICD-10-CM | POA: Diagnosis not present

## 2015-01-12 DIAGNOSIS — Z79899 Other long term (current) drug therapy: Secondary | ICD-10-CM | POA: Insufficient documentation

## 2015-01-12 DIAGNOSIS — Z6841 Body Mass Index (BMI) 40.0 and over, adult: Secondary | ICD-10-CM | POA: Diagnosis not present

## 2015-01-12 DIAGNOSIS — Z7982 Long term (current) use of aspirin: Secondary | ICD-10-CM | POA: Diagnosis not present

## 2015-01-12 HISTORY — PX: COLONOSCOPY: SHX5424

## 2015-01-12 HISTORY — DX: Chronic obstructive pulmonary disease, unspecified: J44.9

## 2015-01-12 LAB — GLUCOSE, CAPILLARY: Glucose-Capillary: 129 mg/dL — ABNORMAL HIGH (ref 65–99)

## 2015-01-12 SURGERY — COLONOSCOPY
Anesthesia: Moderate Sedation

## 2015-01-12 MED ORDER — MEPERIDINE HCL 100 MG/ML IJ SOLN
INTRAMUSCULAR | Status: DC | PRN
  Administered 2015-01-12 (×2): 25 mg via INTRAVENOUS
  Administered 2015-01-12: 50 mg via INTRAVENOUS

## 2015-01-12 MED ORDER — MEPERIDINE HCL 100 MG/ML IJ SOLN
INTRAMUSCULAR | Status: AC
  Filled 2015-01-12: qty 2

## 2015-01-12 MED ORDER — SODIUM CHLORIDE 0.9 % IV SOLN
INTRAVENOUS | Status: DC
  Administered 2015-01-12: 1000 mL via INTRAVENOUS

## 2015-01-12 MED ORDER — MIDAZOLAM HCL 5 MG/5ML IJ SOLN
INTRAMUSCULAR | Status: AC
  Filled 2015-01-12: qty 10

## 2015-01-12 MED ORDER — MIDAZOLAM HCL 5 MG/5ML IJ SOLN
INTRAMUSCULAR | Status: DC | PRN
  Administered 2015-01-12 (×2): 2 mg via INTRAVENOUS
  Administered 2015-01-12: 1 mg via INTRAVENOUS

## 2015-01-12 MED ORDER — STERILE WATER FOR IRRIGATION IR SOLN
Status: DC | PRN
  Administered 2015-01-12: 09:00:00

## 2015-01-12 NOTE — Discharge Instructions (Signed)
You had 1 polyp removed. You have internal hemorrhoids.   FOLLOW A HIGH FIBER DIET. AVOID ITEMS THAT CAUSE BLOATING & GAS. SEE INFO BELOW.  YOUR BIOPSY RESULTS SHOULD BE BACK IN 7 DAYS.  Next colonoscopy in 5-10 years.    Colonoscopy Care After Read the instructions outlined below and refer to this sheet in the next week. These discharge instructions provide you with general information on caring for yourself after you leave the hospital. While your treatment has been planned according to the most current medical practices available, unavoidable complications occasionally occur. If you have any problems or questions after discharge, call DR. Siddhanth Denk, [phone].  ACTIVITY  You may resume your regular activity, but move at a slower pace for the next 24 hours.   Take frequent rest periods for the next 24 hours.   Walking will help get rid of the air and reduce the bloated feeling in your belly (abdomen).   No driving for 24 hours (because of the medicine (anesthesia) used during the test).   You may shower.   Do not sign any important legal documents or operate any machinery for 24 hours (because of the anesthesia used during the test).    NUTRITION  Drink plenty of fluids.   You may resume your normal diet as instructed by your doctor.   Begin with a light meal and progress to your normal diet. Heavy or fried foods are harder to digest and may make you feel sick to your stomach (nauseated).   Avoid alcoholic beverages for 24 hours or as instructed.    MEDICATIONS  You may resume your normal medications.   WHAT YOU CAN EXPECT TODAY  Some feelings of bloating in the abdomen.   Passage of more gas than usual.   Spotting of blood in your stool or on the toilet paper  .  IF YOU HAD POLYPS REMOVED DURING THE COLONOSCOPY:  Eat a soft diet IF YOU HAVE NAUSEA, BLOATING, ABDOMINAL PAIN, OR VOMITING.    FINDING OUT THE RESULTS OF YOUR TEST Not all test results are  available during your visit. DR. Oneida Alar WILL CALL YOU WITHIN 7 DAYS OF YOUR PROCEDUE WITH YOUR RESULTS. Do not assume everything is normal if you have not heard from DR. Toneisha Savary IN ONE WEEK, CALL HER OFFICE AT [phone].  SEEK IMMEDIATE MEDICAL ATTENTION AND CALL THE OFFICE: [phone] IF:  You have more than a spotting of blood in your stool.   Your belly is swollen (abdominal distention).   You are nauseated or vomiting.   You have a temperature over 101F.   You have abdominal pain or discomfort that is severe or gets worse throughout the day.   High-Fiber Diet A high-fiber diet changes your normal diet to include more whole grains, legumes, fruits, and vegetables. Changes in the diet involve replacing refined carbohydrates with unrefined foods. The calorie level of the diet is essentially unchanged. The Dietary Reference Intake (recommended amount) for adult males is 38 grams per day. For adult females, it is 25 grams per day. Pregnant and lactating women should consume 28 grams of fiber per day. Fiber is the intact part of a plant that is not broken down during digestion. Functional fiber is fiber that has been isolated from the plant to provide a beneficial effect in the body. PURPOSE  Increase stool bulk.   Ease and regulate bowel movements.   Lower cholesterol.  INDICATIONS THAT YOU NEED MORE FIBER  Constipation and hemorrhoids.   Uncomplicated diverticulosis (  intestine condition) and irritable bowel syndrome.   Weight management.   As a protective measure against hardening of the arteries (atherosclerosis), diabetes, and cancer.   GUIDELINES FOR INCREASING FIBER IN THE DIET  Start adding fiber to the diet slowly. A gradual increase of about 5 more grams (2 slices of whole-wheat bread, 2 servings of most fruits or vegetables, or 1 bowl of high-fiber cereal) per day is best. Too rapid an increase in fiber may result in constipation, flatulence, and bloating.   Drink  enough water and fluids to keep your urine clear or pale yellow. Water, juice, or caffeine-free drinks are recommended. Not drinking enough fluid may cause constipation.   Eat a variety of high-fiber foods rather than one type of fiber.   Try to increase your intake of fiber through using high-fiber foods rather than fiber pills or supplements that contain small amounts of fiber.   The goal is to change the types of food eaten. Do not supplement your present diet with high-fiber foods, but replace foods in your present diet.  INCLUDE A VARIETY OF FIBER SOURCES  Replace refined and processed grains with whole grains, canned fruits with fresh fruits, and incorporate other fiber sources. White rice, white breads, and most bakery goods contain little or no fiber.   Brown whole-grain rice, buckwheat oats, and many fruits and vegetables are all good sources of fiber. These include: broccoli, Brussels sprouts, cabbage, cauliflower, beets, sweet potatoes, white potatoes (skin on), carrots, tomatoes, eggplant, squash, berries, fresh fruits, and dried fruits.   Cereals appear to be the richest source of fiber. Cereal fiber is found in whole grains and bran. Bran is the fiber-rich outer coat of cereal grain, which is largely removed in refining. In whole-grain cereals, the bran remains. In breakfast cereals, the largest amount of fiber is found in those with "bran" in their names. The fiber content is sometimes indicated on the label.   You may need to include additional fruits and vegetables each day.   In baking, for 1 cup white flour, you may use the following substitutions:   1 cup whole-wheat flour minus 2 tablespoons.   1/2 cup white flour plus 1/2 cup whole-wheat flour.   Polyps, Colon  A polyp is extra tissue that grows inside your body. Colon polyps grow in the large intestine. The large intestine, also called the colon, is part of your digestive system. It is a long, hollow tube at the end of  your digestive tract where your body makes and stores stool. Most polyps are not dangerous. They are benign. This means they are not cancerous. But over time, some types of polyps can turn into cancer. Polyps that are smaller than a pea are usually not harmful. But larger polyps could someday become or may already be cancerous. To be safe, doctors remove all polyps and test them.   WHO GETS POLYPS? Anyone can get polyps, but certain people are more likely than others. You may have a greater chance of getting polyps if:  You are over 50.   You have had polyps before.   Someone in your family has had polyps.   Someone in your family has had cancer of the large intestine.   Find out if someone in your family has had polyps. You may also be more likely to get polyps if you:   Eat a lot of fatty foods   Smoke   Drink alcohol   Do not exercise  Eat too much  TREATMENT  The caregiver will remove the polyp during sigmoidoscopy or colonoscopy.    PREVENTION There is not one sure way to prevent polyps. You might be able to lower your risk of getting them if you:  Eat more fruits and vegetables and less fatty food.   Do not smoke.   Avoid alcohol.   Exercise every day.   Lose weight if you are overweight.   Eating more calcium and folate can also lower your risk of getting polyps. Some foods that are rich in calcium are milk, cheese, and broccoli. Some foods that are rich in folate are chickpeas, kidney beans, and spinach.   Hemorrhoids Hemorrhoids are dilated (enlarged) veins around the rectum. Sometimes clots will form in the veins. This makes them swollen and painful. These are called thrombosed hemorrhoids. Causes of hemorrhoids include:  Constipation.   Straining to have a bowel movement.   HEAVY LIFTING  HOME CARE INSTRUCTIONS  Eat a well balanced diet and drink 6 to 8 glasses of water every day to avoid constipation. You may also use a bulk laxative.   Avoid  straining to have bowel movements.   Keep anal area dry and clean.   Do not use a donut shaped pillow or sit on the toilet for long periods. This increases blood pooling and pain.   Move your bowels when your body has the urge; this will require less straining and will decrease pain and pressure.

## 2015-01-12 NOTE — H&P (Signed)
Primary Care Physician:  Rosita Fire, MD Primary Gastroenterologist:  Dr. Oneida Alar  Pre-Procedure History & Physical: HPI:  [name] is a 54 y.o. female here for [place].  Past Medical History  Diagnosis Date  . Diabetes mellitus without complication   . Hypertension   . Asthma   . Allergy   . Morbid obesity due to excess calories   . COPD (chronic obstructive pulmonary disease)     Past Surgical History  Procedure Laterality Date  . Cholecystectomy    . Nasal polyp surgery    . Carpal tunnel release Right   . Hysteroscopy w/d&c N/A 04/01/2013    Procedure: DILATATION AND CURETTAGE /HYSTEROSCOPY;  Surgeon: Florian Buff, MD;  Location: [facility];  Service: Gynecology;  Laterality: N/A;    Prior to Admission medications   Medication Sig Start Date End Date Taking? Authorizing Provider  amLODipine (NORVASC) 5 MG tablet Take 5 mg by mouth daily. 12/12/14  Yes Historical Provider, MD  aspirin 81 MG tablet Take 81 mg by mouth daily.   Yes Historical Provider, MD  dicyclomine (BENTYL) 10 MG capsule Take 1 capsule (10 mg total) by mouth 4 (four) times daily -  before meals and at bedtime. 12/14/14  Yes Orvil Feil, NP  Fluticasone-Salmeterol (ADVAIR) 250-50 MCG/DOSE AEPB Inhale 1 puff into the lungs 2 (two) times daily.    Yes Historical Provider, MD  hydrochlorothiazide (HYDRODIURIL) 25 MG tablet  12/11/14  Yes Historical Provider, MD  ipratropium-albuterol (DUONEB) 0.5-2.5 (3) MG/3ML SOLN Take 3 mLs by nebulization every 4 (four) hours as needed (Shortness of Breath).    Yes Historical Provider, MD  lisinopril (PRINIVIL,ZESTRIL) 40 MG tablet Take 40 mg by mouth daily.   Yes Historical Provider, MD  metFORMIN (GLUCOPHAGE) 500 MG tablet Take 1,000 mg by mouth 2 (two) times daily with a meal.    Yes Historical Provider, MD  Multiple Vitamin (MULTIVITAMIN WITH MINERALS) TABS tablet Take 1 tablet by mouth daily.   Yes Historical Provider, MD  atorvastatin (LIPITOR) 10 MG  tablet Take 10 mg by mouth daily. 12/12/14   Historical Provider, MD  ketorolac (TORADOL) 10 MG tablet Take 1 tablet (10 mg total) by mouth every 8 (eight) hours as needed for pain. Patient not taking: Reported on 12/26/2014 04/01/13   Florian Buff, MD  loratadine (CLARITIN) 10 MG tablet Take 10 mg by mouth daily.    Historical Provider, MD  peg 3350 powder (MOVIPREP) 100 G SOLR Take 1 kit (200 g total) by mouth as directed. 12/14/14   Danie Binder, MD  progesterone (PROMETRIUM) 200 MG capsule Take 1 capsule (200 mg total) by mouth daily. 12/26/14   Florian Buff, MD  TRAMADOL-ACETAMINOPHEN PO Take by mouth 2 (two) times daily as needed.    Historical Provider, MD    Allergies as of 12/14/2014  . (No Known Allergies)    Family History  Problem Relation Age of Onset  . Diabetes Mother   . Kidney disease Mother   . Other Mother     renal failure  . Hypertension Mother   . Diabetes Father   . Cancer Father     pancreatic  . Diabetes Sister   . Kidney disease Sister   . Diabetes Maternal Grandmother   . Asthma Maternal Grandmother   . COPD Maternal Grandmother     bronchitis  . Hypertension Maternal Grandmother   . Thyroid disease Maternal Grandmother   . Colon cancer Neg Hx  History   Social History  . Marital Status: Married    Spouse Name: N/A  . Number of Children: N/A  . Years of Education: N/A   Occupational History  . Not on file.   Social History Main Topics  . Smoking status: Former Smoker -- 0.50 packs/day for 20 years    Types: Cigarettes    Quit date: 03/25/1997  . Smokeless tobacco: Never Used  . Alcohol Use: No     Comment: occasional   . Drug Use: No  . Sexual Activity: Yes    Birth Control/ Protection: None   Other Topics Concern  . Not on file   Social History Narrative    Review of Systems: See HPI, otherwise negative ROS   Physical Exam: BP 137/68 mmHg  Temp(Src) 98.1 F (36.7 C) (Oral)  Resp 18  Ht $R'5\' 1"'dA$  (1.549 m)  Wt 249 lb  (112.946 kg)  BMI 47.07 kg/m2  SpO2 96%  LMP 12/21/2014 (Approximate) General:   Alert,  pleasant and cooperative in NAD Head:  Normocephalic and atraumatic. Neck:  Supple; Lungs:  Clear throughout to auscultation.    Heart:  Regular rate and rhythm. Abdomen:  Soft, nontender and nondistended. Normal bowel sounds, without guarding, and without rebound.   Neurologic:  Alert and  oriented x4;  grossly normal neurologically.  Impression/Plan:     SCREENING  Plan:  1. TCS TODAY

## 2015-01-12 NOTE — Op Note (Signed)
[place], [address]   COLONOSCOPY PROCEDURE REPORT  PATIENT: [name], [name]  MR#: [id number] BIRTHDATE: 08/08/1960 , 47  yrs. old GENDER: female ENDOSCOPIST: Danie Binder, MD REFERRED PN:[id number] Fanta, M.D. PROCEDURE DATE:  29-Jan-2015 PROCEDURE:   Colonoscopy with cold biopsy polypectomy INDICATIONS:average risk patient for colon cancer. MEDICATIONS: Demerol 100 mg IV and Versed 5 mg IV  DESCRIPTION OF PROCEDURE:    Physical exam was performed.  Informed consent was obtained from the patient after explaining the benefits, risks, and alternatives to procedure.  The patient was connected to monitor and placed in left lateral position. Continuous oxygen was provided by nasal cannula and IV medicine administered through an indwelling cannula.  After administration of sedation and rectal exam, the patients rectum was intubated and the EC-[id number] ([id number])  colonoscope was advanced under direct visualization to the cecum.  The scope was removed slowly by carefully examining the color, texture, anatomy, and integrity mucosa on the way out.  The patient was recovered in endoscopy and discharged home in satisfactory condition. Estimated blood loss is zero unless otherwise noted in this procedure report.    COLON FINDINGS: A sessile polyp measuring 4 mm in size was found.  A polypectomy was performed with cold forceps.  , The colon was redundant.  Manual abdominal counter-pressure was used to reach the cecum, and Small internal hemorrhoids were found.  PREP QUALITY: excellent.  CECAL W/D TIME: 9       minutes COMPLICATIONS: None  ENDOSCOPIC IMPRESSION: 1.   ONE COLON POLYP RMOVED 2.   The LEFT colon I SLSIGHTLY redundant 3.   Small internal hemorrhoids  RECOMMENDATIONS: AWIAT BIOPSY HIGH FIBER DIET NEXT TCS IN 5-10 YEARS.     _______________________________ Lorrin MaisDanie Binder, MD Jan 29, 2015 12:02 PM    CPT CODES: ICD  CODES:  The ICD and CPT codes recommended by this software are interpretations from the data that the clinical staff has captured with the software.  The verification of the translation of this report to the ICD and CPT codes and modifiers is the sole responsibility of the health care institution and practicing physician where this report was generated.  [employer]. will not be held responsible for the validity of the ICD and CPT codes included on this report.  AMA assumes no liability for data contained or not contained herein. CPT is a Designer, television/film set of the [employer].

## 2015-01-12 NOTE — Progress Notes (Signed)
REVIEWED-NO ADDITIONAL RECOMMENDATIONS. 

## 2015-01-12 NOTE — Progress Notes (Signed)
Please excuse [name] from work on Friday August 5th, 2016.  She my return to work on Monday August 8th, 2016 with no restrictions.

## 2015-01-16 ENCOUNTER — Encounter (HOSPITAL_COMMUNITY): Payer: Self-pay | Admitting: Gastroenterology

## 2015-01-18 ENCOUNTER — Telehealth: Payer: Self-pay | Admitting: Gastroenterology

## 2015-01-18 NOTE — Telephone Encounter (Signed)
PT is aware of results.  

## 2015-01-18 NOTE — Telephone Encounter (Signed)
Please call pt. She had A HYPERPLASTIC POLYP removed from her colon.    FOLLOW A HIGH FIBER DIET. AVOID ITEMS THAT CAUSE BLOATING & GAS.  Next colonoscopy in 10 years.

## 2015-01-18 NOTE — Telephone Encounter (Signed)
REMINDER IN EPIC °

## 2015-01-18 NOTE — Telephone Encounter (Signed)
LMOM to call.

## 2015-08-09 ENCOUNTER — Emergency Department (HOSPITAL_COMMUNITY): Payer: BLUE CROSS/BLUE SHIELD

## 2015-08-09 ENCOUNTER — Emergency Department (HOSPITAL_COMMUNITY)
Admission: EM | Admit: 2015-08-09 | Discharge: 2015-08-09 | Disposition: A | Discharge status: Home / Self Care | Payer: BLUE CROSS/BLUE SHIELD | Attending: Emergency Medicine | Admitting: Emergency Medicine

## 2015-08-09 ENCOUNTER — Encounter (HOSPITAL_COMMUNITY): Payer: Self-pay | Admitting: Emergency Medicine

## 2015-08-09 DIAGNOSIS — X58XXXA Exposure to other specified factors, initial encounter: Secondary | ICD-10-CM | POA: Insufficient documentation

## 2015-08-09 DIAGNOSIS — S39012A Strain of muscle, fascia and tendon of lower back, initial encounter: Secondary | ICD-10-CM | POA: Insufficient documentation

## 2015-08-09 DIAGNOSIS — S3992XA Unspecified injury of lower back, initial encounter: Secondary | ICD-10-CM | POA: Diagnosis present

## 2015-08-09 DIAGNOSIS — Y9289 Other specified places as the place of occurrence of the external cause: Secondary | ICD-10-CM | POA: Diagnosis not present

## 2015-08-09 DIAGNOSIS — Y990 Civilian activity done for income or pay: Secondary | ICD-10-CM | POA: Insufficient documentation

## 2015-08-09 DIAGNOSIS — Y9389 Activity, other specified: Secondary | ICD-10-CM | POA: Insufficient documentation

## 2015-08-09 DIAGNOSIS — Z791 Long term (current) use of non-steroidal anti-inflammatories (NSAID): Secondary | ICD-10-CM | POA: Insufficient documentation

## 2015-08-09 DIAGNOSIS — Z87891 Personal history of nicotine dependence: Secondary | ICD-10-CM | POA: Diagnosis not present

## 2015-08-09 DIAGNOSIS — Z79899 Other long term (current) drug therapy: Secondary | ICD-10-CM | POA: Insufficient documentation

## 2015-08-09 DIAGNOSIS — J449 Chronic obstructive pulmonary disease, unspecified: Secondary | ICD-10-CM | POA: Diagnosis not present

## 2015-08-09 DIAGNOSIS — Z7984 Long term (current) use of oral hypoglycemic drugs: Secondary | ICD-10-CM | POA: Insufficient documentation

## 2015-08-09 DIAGNOSIS — I10 Essential (primary) hypertension: Secondary | ICD-10-CM | POA: Diagnosis not present

## 2015-08-09 DIAGNOSIS — E119 Type 2 diabetes mellitus without complications: Secondary | ICD-10-CM | POA: Diagnosis not present

## 2015-08-09 DIAGNOSIS — Z7951 Long term (current) use of inhaled steroids: Secondary | ICD-10-CM | POA: Diagnosis not present

## 2015-08-09 MED ORDER — HYDROCODONE-ACETAMINOPHEN 5-325 MG PO TABS
1.0000 | ORAL_TABLET | Freq: Once | ORAL | Status: AC
  Administered 2015-08-09: 1 via ORAL
  Filled 2015-08-09: qty 1

## 2015-08-09 MED ORDER — METHOCARBAMOL 500 MG PO TABS: 1000.0000 mg | ORAL_TABLET | Freq: Four times a day (QID) | ORAL | Status: AC

## 2015-08-09 MED ORDER — HYDROCODONE-ACETAMINOPHEN 5-325 MG PO TABS: 1.0000 | ORAL_TABLET | ORAL | Status: DC | PRN

## 2015-08-09 NOTE — ED Provider Notes (Signed)
CSN: [id number]     Arrival date & time 08/09/15  1759 History   First MD Initiated Contact with Patient 08/09/15 1841     Chief Complaint  Patient presents with  . Back Pain     (Consider location/radiation/quality/duration/timing/severity/associated sxs/prior Treatment) The history is provided by the patient.   [name] is a 55 y.o. female presenting with sudden onset of low back pain upon sneezing today while at work.  She reports intermittent low back pain at baseline but has never required medication or treatment for this mild pain. Today her pain is severe.  She denies weakness or numbness in her legs, denies urinary or fecal incontinence or retention, denies abdominal pain or distention, dysuria or hematuria. She has taken ibuprofen without relief of pain.    Past Medical History  Diagnosis Date  . Diabetes mellitus without complication ([place])   . Hypertension   . Asthma   . Allergy   . Morbid obesity due to excess calories ([place])   . COPD (chronic obstructive pulmonary disease) [facility])    Past Surgical History  Procedure Laterality Date  . Cholecystectomy    . Nasal polyp surgery    . Carpal tunnel release Right   . Hysteroscopy w/d&c N/A 04/01/2013    Procedure: DILATATION AND CURETTAGE /HYSTEROSCOPY;  Surgeon: Florian Buff, MD;  Location: [facility];  Service: Gynecology;  Laterality: N/A;  . Colonoscopy N/A 01/12/2015    Procedure: COLONOSCOPY;  Surgeon: Danie Binder, MD;  Location: [facility];  Service: Endoscopy;  Laterality: N/A;  0830   Family History  Problem Relation Age of Onset  . Diabetes Mother   . Kidney disease Mother   . Other Mother     renal failure  . Hypertension Mother   . Diabetes Father   . Cancer Father     pancreatic  . Diabetes Sister   . Kidney disease Sister   . Diabetes Maternal Grandmother   . Asthma Maternal Grandmother   . COPD Maternal Grandmother     bronchitis  . Hypertension Maternal Grandmother   . Thyroid disease  Maternal Grandmother   . Colon cancer Neg Hx    Social History  Substance Use Topics  . Smoking status: Former Smoker -- 0.50 packs/day for 20 years    Types: Cigarettes    Quit date: 03/25/1997  . Smokeless tobacco: Never Used  . Alcohol Use: No     Comment: occasional    OB History    No data available     Review of Systems  Constitutional: Negative for fever.  Respiratory: Negative for shortness of breath.   Cardiovascular: Negative for chest pain and leg swelling.  Gastrointestinal: Negative for nausea, vomiting, abdominal pain, constipation and abdominal distention.  Genitourinary: Negative for dysuria, urgency, frequency, flank pain and difficulty urinating.  Musculoskeletal: Positive for back pain. Negative for joint swelling and gait problem.  Skin: Negative for rash.  Neurological: Negative for weakness and numbness.      Allergies  Review of patient's allergies indicates no known allergies.  Home Medications   Prior to Admission medications   Medication Sig Start Date End Date Taking? Authorizing Provider  amLODipine (NORVASC) 5 MG tablet Take 5 mg by mouth daily. 12/12/14  Yes Historical Provider, MD  Fluticasone-Salmeterol (ADVAIR) 250-50 MCG/DOSE AEPB Inhale 1 puff into the lungs 2 (two) times daily.    Yes Historical Provider, MD  JANUVIA 100 MG tablet Take 100 mg by mouth daily. 07/18/15  Yes  Historical Provider, MD  lisinopril (PRINIVIL,ZESTRIL) 40 MG tablet Take 40 mg by mouth daily.   Yes Historical Provider, MD  metFORMIN (GLUCOPHAGE) 500 MG tablet Take 1,000 mg by mouth 2 (two) times daily with a meal.    Yes Historical Provider, MD  Multiple Vitamin (MULTIVITAMIN WITH MINERALS) TABS tablet Take 1 tablet by mouth daily.   Yes Historical Provider, MD  traMADol (ULTRAM) 50 MG tablet Take 50 mg by mouth 2 (two) times daily. 08/04/15  Yes Historical Provider, MD  dicyclomine (BENTYL) 10 MG capsule Take 1 capsule (10 mg total) by mouth 4 (four) times daily -   before meals and at bedtime. Patient not taking: Reported on 08/09/2015 12/14/14   Orvil Feil, NP  HYDROcodone-acetaminophen (NORCO/VICODIN) 5-325 MG tablet Take 1 tablet by mouth every 4 (four) hours as needed. 08/09/15   Evalee Jefferson, PA-C  ipratropium-albuterol (DUONEB) 0.5-2.5 (3) MG/3ML SOLN Take 3 mLs by nebulization every 4 (four) hours as needed (Shortness of Breath).     Historical Provider, MD  methocarbamol (ROBAXIN) 500 MG tablet Take 2 tablets (1,000 mg total) by mouth 4 (four) times daily. 08/09/15 08/19/15  Evalee Jefferson, PA-C  progesterone (PROMETRIUM) 200 MG capsule Take 1 capsule (200 mg total) by mouth daily. Patient not taking: Reported on 08/09/2015 12/26/14   Florian Buff, MD   BP 176/84 mmHg  Pulse 89  Temp(Src) 98.4 F (36.9 C) (Oral)  Resp 18  Ht 5\' 1"  (1.549 m)  Wt 113.399 kg  BMI 47.26 kg/m2  SpO2 98%  LMP 05/24/2015 Physical Exam  Constitutional: She appears well-developed and well-nourished.  HENT:  Head: Normocephalic.  Eyes: Conjunctivae are normal.  Neck: Normal range of motion. Neck supple.  Cardiovascular: Normal rate and intact distal pulses.   Pedal pulses normal.  Pulmonary/Chest: Effort normal.  Abdominal: Soft. Bowel sounds are normal. She exhibits no distension and no mass.  Musculoskeletal: Normal range of motion. She exhibits no edema.       Lumbar back: She exhibits bony tenderness. She exhibits no swelling, no edema and no spasm.  Midline and left paralumbar ttp.   Neurological: She is alert. She has normal strength. She displays no atrophy and no tremor. No sensory deficit. Gait normal.  Reflex Scores:      Patellar reflexes are 2+ on the right side and 2+ on the left side.      Achilles reflexes are 2+ on the right side and 2+ on the left side. No strength deficit noted in hip and knee flexor and extensor muscle groups.  Ankle flexion and extension intact.  Skin: Skin is warm and dry.  Psychiatric: She has a normal mood and affect.  Nursing note  and vitals reviewed.   ED Course  Procedures (including critical care time) Labs Review Labs Reviewed - No data to display  Imaging Review Dg Lumbar Spine Complete  08/09/2015  CLINICAL DATA:  Low back pain following sneezing, initial encounter EXAM: LUMBAR SPINE - COMPLETE 4+ VIEW COMPARISON:  None. FINDINGS: Five lumbar type vertebral bodies are well visualized. Vertebral body height is well maintained. Mild osteophytic changes are noted at L4-5 with mild degenerative anterolisthesis of L4 on L5. No gross soft tissue abnormality is seen. 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 Electronically Signed   By: Inez Catalina M.D.   On: 08/09/2015 19:38   I have personally reviewed and evaluated these images and lab results as part of my medical decision-making.   EKG Interpretation None  MDM   Final diagnoses:  Lumbar strain, initial encounter    No neuro deficit on exam or by history to suggest emergent or surgical presentation.  Also discussed worsened sx that should prompt immediate re-evaluation including distal weakness, bowel/bladder retention/incontinence.  Pt prescribed hydrocodone, robaxin.  Ice tx, adding heat tx on day 3.  Activity as tolerated. F/u with pcp in one week if not improved.       Evalee Jefferson, PA-C 08/10/15 [place], DO 08/12/15 [phone]

## 2015-08-09 NOTE — Discharge Instructions (Signed)
Lumbosacral Strain Lumbosacral strain is a strain of any of the parts that make up your lumbosacral vertebrae. Your lumbosacral vertebrae are the bones that make up the lower third of your backbone. Your lumbosacral vertebrae are held together by muscles and tough, fibrous tissue (ligaments).  CAUSES  A sudden blow to your back can cause lumbosacral strain. Also, anything that causes an excessive stretch of the muscles in the low back can cause this strain. This is typically seen when people exert themselves strenuously, fall, lift heavy objects, bend, or crouch repeatedly. RISK FACTORS  Physically demanding work.  Participation in pushing or pulling sports or sports that require a sudden twist of the back (tennis, golf, baseball).  Weight lifting.  Excessive lower back curvature.  Forward-tilted pelvis.  Weak back or abdominal muscles or both.  Tight hamstrings. SIGNS AND SYMPTOMS  Lumbosacral strain may cause pain in the area of your injury or pain that moves (radiates) down your leg.  DIAGNOSIS Your health care provider can often diagnose lumbosacral strain through a physical exam. In some cases, you may need tests such as X-ray exams.  TREATMENT  Treatment for your lower back injury depends on many factors that your clinician will have to evaluate. However, most treatment will include the use of anti-inflammatory medicines. HOME CARE INSTRUCTIONS   Avoid hard physical activities (tennis, racquetball, waterskiing) if you are not in proper physical condition for it. This may aggravate or create problems.  If you have a back problem, avoid sports requiring sudden body movements. Swimming and walking are generally safer activities.  Maintain good posture.  Maintain a healthy weight.  For acute conditions, you may put ice on the injured area.  Put ice in a plastic bag.  Place a towel between your skin and the bag.  Leave the ice on for 20 minutes, 2-3 times a day.  When the  low back starts healing, stretching and strengthening exercises may be recommended. SEEK MEDICAL CARE IF:  Your back pain is getting worse.  You experience severe back pain not relieved with medicines. SEEK IMMEDIATE MEDICAL CARE IF:   You have numbness, tingling, weakness, or problems with the use of your arms or legs.  There is a change in bowel or bladder control.  You have increasing pain in any area of the body, including your belly (abdomen).  You notice shortness of breath, dizziness, or feel faint.  You feel sick to your stomach (nauseous), are throwing up (vomiting), or become sweaty.  You notice discoloration of your toes or legs, or your feet get very cold. MAKE SURE YOU:   Understand these instructions.  Will watch your condition.  Will get help right away if you are not doing well or get worse.   This information is not intended to replace advice given to you by your health care provider. Make sure you discuss any questions you have with your health care provider.   Document Released: 03/05/2005 Document Revised: 06/16/2014 Document Reviewed: 01/12/2013 Elsevier Interactive Patient Education 2016 [place] not drive within 4 hours of taking hydrocodone as this will make you drowsy.  Avoid lifting,  Bending,  Twisting or any other activity that worsens your pain over the next week.  Apply an  icepack  to your lower back for 10-15 minutes every 2 hours for the next 2 days.  You should get rechecked if your symptoms are not better over the next 5 days,  Or you develop increased pain,  Weakness in your leg(s) or loss of bladder or bowel function - these are symptoms of a worse injury.

## 2015-08-09 NOTE — ED Notes (Signed)
PT states she has had nasal congestion and dry cough x2 days and sneezed at work and started having lower back pain.

## 2015-08-09 NOTE — ED Notes (Signed)
Pt alert & oriented x4, stable gait. Patient given discharge instructions, paperwork & prescription(s). Patient informed not to drive, operate any equipment & handel any important documents 4 hours after taking pain medication. Patient  instructed to stop at the registration desk to finish any additional paperwork. Patient  verbalized understanding. Pt left department w/ no further questions. 

## 2016-07-26 ENCOUNTER — Encounter (HOSPITAL_COMMUNITY): Payer: Self-pay | Admitting: Emergency Medicine

## 2016-07-26 ENCOUNTER — Emergency Department (HOSPITAL_COMMUNITY)
Admission: EM | Admit: 2016-07-26 | Discharge: 2016-07-26 | Disposition: A | Discharge status: Home / Self Care | Payer: BLUE CROSS/BLUE SHIELD | Attending: Emergency Medicine | Admitting: Emergency Medicine

## 2016-07-26 DIAGNOSIS — Z87891 Personal history of nicotine dependence: Secondary | ICD-10-CM | POA: Diagnosis not present

## 2016-07-26 DIAGNOSIS — H6592 Unspecified nonsuppurative otitis media, left ear: Secondary | ICD-10-CM | POA: Diagnosis not present

## 2016-07-26 DIAGNOSIS — I10 Essential (primary) hypertension: Secondary | ICD-10-CM | POA: Insufficient documentation

## 2016-07-26 DIAGNOSIS — J45909 Unspecified asthma, uncomplicated: Secondary | ICD-10-CM | POA: Diagnosis not present

## 2016-07-26 DIAGNOSIS — E119 Type 2 diabetes mellitus without complications: Secondary | ICD-10-CM | POA: Insufficient documentation

## 2016-07-26 DIAGNOSIS — J449 Chronic obstructive pulmonary disease, unspecified: Secondary | ICD-10-CM | POA: Diagnosis not present

## 2016-07-26 DIAGNOSIS — J069 Acute upper respiratory infection, unspecified: Secondary | ICD-10-CM | POA: Diagnosis not present

## 2016-07-26 DIAGNOSIS — Z7984 Long term (current) use of oral hypoglycemic drugs: Secondary | ICD-10-CM | POA: Insufficient documentation

## 2016-07-26 DIAGNOSIS — H9202 Otalgia, left ear: Secondary | ICD-10-CM | POA: Diagnosis present

## 2016-07-26 MED ORDER — AMOXICILLIN 500 MG PO CAPS: 500.0000 mg | ORAL_CAPSULE | Freq: Three times a day (TID) | ORAL | 0 refills | Status: DC

## 2016-07-26 MED ORDER — MOMETASONE FUROATE 50 MCG/ACT NA SUSP: NASAL | 12 refills | Status: DC

## 2016-07-26 NOTE — ED Triage Notes (Signed)
Patient c/o left side ear pain that started that started Thursday and progressively has gotten worse. Patient states pain now radiates down left side of neck. Denies any fevers but reports possible ear drainage. Patient also reports cough, nasal drainage, and "slight dizziness." Denies any neurological deficits.

## 2016-07-26 NOTE — ED Provider Notes (Signed)
[facility] DEPT Provider Note   CSN: [id number] Arrival date & time: 07/26/16  0800     History   Chief Complaint Chief Complaint  Patient presents with  . Otalgia    HPI [name] is a 56 y.o. female.  HPI  [name] is a 56 y.o. female who presents to the Emergency Department complaining of nasal congestion, ear pain and pressure and intermittent cough.  symptoms have been present for two days, but ear pain much worse this morning.  She describes a sharp pain in her left ear that radiates to her left face toward her neck.  She also reports decreased hearing from her left ear and some occasional dizziness associated with movement.  Cough has been non-productive.  She denies fever, chills, chest pain, sore throat, headaches, visual changes, and shortness of breath.     Past Medical History:  Diagnosis Date  . Allergy   . Asthma   . COPD (chronic obstructive pulmonary disease) ([place])   . Diabetes mellitus without complication ([place])   . Hypertension   . Morbid obesity due to excess calories [facility])     Patient Active Problem List   Diagnosis Date Noted  . Special screening for malignant neoplasms, colon   . Encounter for screening colonoscopy 12/14/2014  . Thickened endometrium 03/15/2013  . Hypertension 03/07/2013  . Diabetes mellitus, type II ([place]) 03/07/2013  . Asthma 03/07/2013  . Postmenopausal bleeding 03/07/2013  . Morbid obesity with BMI of 45.0-49.9, adult ([place]) 03/07/2013    Past Surgical History:  Procedure Laterality Date  . CARPAL TUNNEL RELEASE Right   . CHOLECYSTECTOMY    . COLONOSCOPY N/A 01/12/2015   Procedure: COLONOSCOPY;  Surgeon: Danie Binder, MD;  Location: [facility] ENDO SUITE;  Service: Endoscopy;  Laterality: N/A;  0830  . HYSTEROSCOPY W/D&C N/A 04/01/2013   Procedure: DILATATION AND CURETTAGE /HYSTEROSCOPY;  Surgeon: Florian Buff, MD;  Location: [facility] ORS;  Service: Gynecology;  Laterality: N/A;  . NASAL POLYP SURGERY      OB History      Gravida Para Term Preterm AB Living   2 2 2     2    SAB TAB Ectopic Multiple Live Births                   Home Medications    Prior to Admission medications   Medication Sig Start Date End Date Taking? Authorizing Provider  amLODipine (NORVASC) 5 MG tablet Take 5 mg by mouth daily. 12/12/14   Historical Provider, MD  dicyclomine (BENTYL) 10 MG capsule Take 1 capsule (10 mg total) by mouth 4 (four) times daily -  before meals and at bedtime. Patient not taking: Reported on 08/09/2015 12/14/14   Annitta Needs, NP  Fluticasone-Salmeterol (ADVAIR) 250-50 MCG/DOSE AEPB Inhale 1 puff into the lungs 2 (two) times daily.     Historical Provider, MD  HYDROcodone-acetaminophen (NORCO/VICODIN) 5-325 MG tablet Take 1 tablet by mouth every 4 (four) hours as needed. 08/09/15   Evalee Jefferson, PA-C  ipratropium-albuterol (DUONEB) 0.5-2.5 (3) MG/3ML SOLN Take 3 mLs by nebulization every 4 (four) hours as needed (Shortness of Breath).     Historical Provider, MD  JANUVIA 100 MG tablet Take 100 mg by mouth daily. 07/18/15   Historical Provider, MD  lisinopril (PRINIVIL,ZESTRIL) 40 MG tablet Take 40 mg by mouth daily.    Historical Provider, MD  metFORMIN (GLUCOPHAGE) 500 MG tablet Take 1,000 mg by mouth 2 (two) times daily with a meal.  Historical Provider, MD  Multiple Vitamin (MULTIVITAMIN WITH MINERALS) TABS tablet Take 1 tablet by mouth daily.    Historical Provider, MD  progesterone (PROMETRIUM) 200 MG capsule Take 1 capsule (200 mg total) by mouth daily. Patient not taking: Reported on 08/09/2015 12/26/14   Florian Buff, MD  traMADol (ULTRAM) 50 MG tablet Take 50 mg by mouth 2 (two) times daily. 08/04/15   Historical Provider, MD    Family History Family History  Problem Relation Age of Onset  . Diabetes Mother   . Kidney disease Mother   . Other Mother     renal failure  . Hypertension Mother   . Diabetes Father   . Cancer Father     pancreatic  . Diabetes Sister   . Kidney disease Sister   .  Diabetes Maternal Grandmother   . Asthma Maternal Grandmother   . COPD Maternal Grandmother     bronchitis  . Hypertension Maternal Grandmother   . Thyroid disease Maternal Grandmother   . Colon cancer Neg Hx     Social History Social History  Substance Use Topics  . Smoking status: Former Smoker    Packs/day: 0.50    Years: 20.00    Types: Cigarettes    Quit date: 03/25/1997  . Smokeless tobacco: Never Used  . Alcohol use No     Allergies   Patient has no known allergies.   Review of Systems Review of Systems  Constitutional: Negative for activity change, appetite change, chills and fever.  HENT: Positive for congestion, ear pain and rhinorrhea. Negative for facial swelling, sore throat and trouble swallowing.   Eyes: Negative for visual disturbance.  Respiratory: Positive for cough. Negative for chest tightness, shortness of breath, wheezing and stridor.   Cardiovascular: Negative for chest pain.  Gastrointestinal: Negative for abdominal pain, nausea and vomiting.  Musculoskeletal: Negative for neck pain and neck stiffness.  Skin: Negative.   Neurological: Positive for dizziness. Negative for syncope, facial asymmetry, speech difficulty, weakness, numbness and headaches.  Hematological: Negative for adenopathy.  Psychiatric/Behavioral: Negative for confusion.  All other systems reviewed and are negative.    Physical Exam Updated Vital Signs BP 162/86 (BP Location: Right Arm)   Pulse 91   Temp 98.6 F (37 C) (Oral)   Resp 18   Ht 5\' 1"  (1.549 m)   Wt 112.5 kg   LMP 05/24/2015   SpO2 97%   BMI 46.86 kg/m   Physical Exam  Constitutional: She is oriented to person, place, and time. She appears well-developed and well-nourished. No distress.  HENT:  Head: Normocephalic and atraumatic.  Right Ear: Tympanic membrane and ear canal normal.  Left Ear: Ear canal normal. No drainage. Tympanic membrane is erythematous. Tympanic membrane is not bulging. A middle ear  effusion is present. Decreased hearing is noted.  Nose: Mucosal edema present. No rhinorrhea.  Mouth/Throat: Uvula is midline and mucous membranes are normal. No trismus in the jaw. No uvula swelling. No oropharyngeal exudate, posterior oropharyngeal edema, posterior oropharyngeal erythema or tonsillar abscesses.  Eyes: Conjunctivae and EOM are normal. Pupils are equal, round, and reactive to light.  Neck: Normal range of motion and phonation normal. Neck supple. No Brudzinski's sign and no Kernig's sign noted.  Cardiovascular: Normal rate, regular rhythm and normal heart sounds.   No murmur heard. Pulmonary/Chest: Effort normal and breath sounds normal. No respiratory distress. She has no wheezes. She has no rales.  Abdominal: Soft. She exhibits no distension. There is no tenderness. There is no  rebound and no guarding.  Musculoskeletal: She exhibits no edema.  Lymphadenopathy:    She has no cervical adenopathy.  Neurological: She is alert and oriented to person, place, and time. She exhibits normal muscle tone. Coordination normal.  CN II-XII intact  Skin: Skin is warm and dry.  Psychiatric: She has a normal mood and affect.  Nursing note and vitals reviewed.    ED Treatments / Results  Labs (all labs ordered are listed, but only abnormal results are displayed) Labs Reviewed - No data to display  EKG  EKG Interpretation None       Radiology No results found.  Procedures Procedures (including critical care time)  Medications Ordered in ED Medications - No data to display   Initial Impression / Assessment and Plan / ED Course  I have reviewed the triage vital signs and the nursing notes.  Pertinent labs & imaging results that were available during my care of the patient were reviewed by me and considered in my medical decision making (see chart for details).     Pt well appearing.  vitals stable.  Non-focal neuro exam.  Ambulates with steady gait.  Likely left OM  secondary to a viral process.  Will tx with amoxil and nasonex.  Pt appears stable for d/c, tx plan discussed and all questions answered  Final Clinical Impressions(s) / ED Diagnoses   Final diagnoses:  Left non-suppurative otitis media  Upper respiratory tract infection, unspecified type    New Prescriptions New Prescriptions   No medications on file     Kem Parkinson, Hershal Coria 07/26/16 [place], MD 07/26/16 [phone]

## 2016-07-26 NOTE — Discharge Instructions (Signed)
Tylenol or ibuprofen if needed for fever.  Follow-up with your doctor for recheck if needed

## 2016-09-03 ENCOUNTER — Encounter: Payer: BLUE CROSS/BLUE SHIELD | Attending: Internal Medicine | Admitting: Nutrition

## 2016-09-03 VITALS — Ht 61.0 in | Wt 242.0 lb

## 2016-09-03 DIAGNOSIS — I10 Essential (primary) hypertension: Secondary | ICD-10-CM | POA: Diagnosis not present

## 2016-09-03 DIAGNOSIS — Z713 Dietary counseling and surveillance: Secondary | ICD-10-CM | POA: Insufficient documentation

## 2016-09-03 DIAGNOSIS — Z6841 Body Mass Index (BMI) 40.0 and over, adult: Secondary | ICD-10-CM | POA: Insufficient documentation

## 2016-09-03 DIAGNOSIS — E1142 Type 2 diabetes mellitus with diabetic polyneuropathy: Secondary | ICD-10-CM | POA: Diagnosis present

## 2016-09-03 DIAGNOSIS — IMO0002 Reserved for concepts with insufficient information to code with codable children: Secondary | ICD-10-CM

## 2016-09-03 DIAGNOSIS — E118 Type 2 diabetes mellitus with unspecified complications: Secondary | ICD-10-CM

## 2016-09-03 DIAGNOSIS — E1165 Type 2 diabetes mellitus with hyperglycemia: Secondary | ICD-10-CM | POA: Diagnosis not present

## 2016-09-03 NOTE — Patient Instructions (Addendum)
Goals 1. Follow My Plate Eat 2-3 carb choices per meal Increase fresh fruits and vegetables. 2. Call Dr. Legrand Rams and see about staring on a statin drug 3. Test blood sugars 4 times per day x 1 week.. 4. Eat meals on time 5 Cut out chips for snacks and eat veggies instead 6. Exercise 30 minutes three times per week Get A1C down to 8% in three months Lose 5 lbs per month

## 2016-09-03 NOTE — Progress Notes (Signed)
Diabetes Self-Management Education  Visit Type: First/Initial  Appt. Start Time: 1600 Appt. End Time: 1700  09/08/2016  [name], identified by name and date of birth, is a 56 y.o. female with a diagnosis of Diabetes: Type 2.   ASSESSMENT  Height 5\' 1"  (1.549 m), weight 242 lb (109.8 kg), last menstrual period 05/24/2015. Body mass index is 45.73 kg/m.      Diabetes Self-Management Education - 09/03/16 1607      Visit Information   Visit Type First/Initial     Initial Visit   Diabetes Type Type 2   Are you currently following a meal plan? No   Are you taking your medications as prescribed? Yes   Date Diagnosed 2005     Health Coping   How would you rate your overall health? Good     Psychosocial Assessment   Patient Belief/Attitude about Diabetes Motivated to manage diabetes   Self-care barriers None   Self-management support Family;Doctor's office   Other persons present Patient   Patient Concerns Nutrition/Meal planning;Medication;Monitoring;Healthy Lifestyle;Problem Solving;Glycemic Control;Weight Control   Special Needs None   Preferred Learning Style No preference indicated   Learning Readiness Ready   How often do you need to have someone help you when you read instructions, pamphlets, or other written materials from your doctor or pharmacy? 1 - Never   What is the last grade level you completed in school? 12     Pre-Education Assessment   Patient understands the diabetes disease and treatment process. Needs Instruction   Patient understands incorporating nutritional management into lifestyle. Needs Instruction   Patient undertands incorporating physical activity into lifestyle. Needs Instruction   Patient understands using medications safely. Needs Instruction   Patient understands monitoring blood glucose, interpreting and using results Needs Instruction   Patient understands prevention, detection, and treatment of acute complications. Needs  Instruction   Patient understands prevention, detection, and treatment of chronic complications. Needs Instruction   Patient understands how to develop strategies to address psychosocial issues. Needs Instruction   Patient understands how to develop strategies to promote health/change behavior. Needs Instruction     Complications   O2U 23.5%   How often do you check your blood sugar? 1-2 times/day   Fasting Blood glucose range (mg/dL) 180-200   Postprandial Blood glucose range (mg/dL) >200   Have you had a dilated eye exam in the past 12 months? Yes   Have you had a dental exam in the past 12 months? No   Are you checking your feet? Yes   How many days per week are you checking your feet? 7     Dietary Intake   Breakfast Cherrios multigrain, 1 slice toast ww and 8 oz juice and [place] sausage   Snack (morning) water, yogurt and chips   Lunch Spicy chicken and pasta 3/4 c and water   Snack (afternoon) Chips, water   Dinner Toss salad and 2 slices pizza and water   Snack (evening) 15 grapes and water   Beverage(s) water,     Patient Education   Previous Diabetes Education No   Disease state  Definition of diabetes, type 1 and 2, and the diagnosis of diabetes;Factors that contribute to the development of diabetes;Explored patient's options for treatment of their diabetes   Nutrition management  Role of diet in the treatment of diabetes and the relationship between the three main macronutrients and blood glucose level;Food label reading, portion sizes and measuring food.;Carbohydrate counting;Information on hints to eating out and  maintain blood glucose control.;Meal timing in regards to the patients' current diabetes medication.   Physical activity and exercise  Role of exercise on diabetes management, blood pressure control and cardiac health.;Identified with patient nutritional and/or medication changes necessary with exercise.;Helped patient identify appropriate exercises in relation to  his/her diabetes, diabetes complications and other health issue.   Medications Reviewed patients medication for diabetes, action, purpose, timing of dose and side effects.   Monitoring Taught/evaluated SMBG meter.;Purpose and frequency of SMBG.;Taught/discussed recording of test results and interpretation of SMBG.;Interpreting lab values - A1C, lipid, urine microalbumina.;Identified appropriate SMBG and/or A1C goals.;Daily foot exams   Acute complications Taught treatment of hypoglycemia - the 15 rule.;Discussed and identified patients' treatment of hyperglycemia.   Chronic complications Relationship between chronic complications and blood glucose control;Assessed and discussed foot care and prevention of foot problems;Lipid levels, blood glucose control and heart disease;Identified and discussed with patient  current chronic complications;Dental care;Retinopathy and reason for yearly dilated eye exams;Nephropathy, what it is, prevention of, the use of ACE, ARB's and early detection of through urine microalbumia.;Reviewed with patient heart disease, higher risk of, and prevention   Psychosocial adjustment Worked with patient to identify barriers to care and solutions;Role of stress on diabetes;Identified and addressed patients feelings and concerns about diabetes;Brainstormed with patient on coping mechanisms for social situations, getting support from significant others, dealing with feelings about diabetes   Personal strategies to promote health Lifestyle issues that need to be addressed for better diabetes care;Helped patient develop diabetes management plan for (enter comment);Review risk of smoking and offered smoking cessation     Individualized Goals (developed by patient)   Nutrition Follow meal plan discussed;General guidelines for healthy choices and portions discussed;Adjust meds/carbs with exercise as discussed   Physical Activity Exercise 3-5 times per week;30 minutes per day   Medications  take my medication as prescribed   Monitoring  test my blood glucose as discussed;test blood glucose pre and post meals as discussed   Reducing Risk examine blood glucose patterns;get labs drawn;do foot checks daily;treat hypoglycemia with 15 grams of carbs if blood glucose less than 70mg /dL;increase portions of healthy fats     Post-Education Assessment   Patient understands the diabetes disease and treatment process. Needs Review   Patient understands incorporating nutritional management into lifestyle. Needs Review   Patient undertands incorporating physical activity into lifestyle. Needs Review   Patient understands using medications safely. Needs Review   Patient understands monitoring blood glucose, interpreting and using results Needs Review   Patient understands prevention, detection, and treatment of acute complications. Needs Review   Patient understands prevention, detection, and treatment of chronic complications. Needs Review   Patient understands how to develop strategies to address psychosocial issues. Needs Review   Patient understands how to develop strategies to promote health/change behavior. Needs Review     Outcomes   Expected Outcomes Demonstrated interest in learning. Expect positive outcomes   Future DMSE 4-6 wks   Program Status Completed      Individualized Plan for Diabetes Self-Management Training:   Learning Objective:  Patient will have a greater understanding of diabetes self-management. Patient education plan is to attend individual and/or group sessions per assessed needs and concerns.   Plan:   Patient Instructions  Goals 1. Follow My Plate Eat 2-3 carb choices per meal Increase fresh fruits and vegetables. 2. Call Dr. Legrand Rams and see about staring on a statin drug 3. Test blood sugars 4 times per day x 1 week.. 4. Eat meals on  time 5 Cut out chips for snacks and eat veggies instead 6. Exercise 30 minutes three times per week Get A1C down to 8%  in three months Lose 5 lbs per month    Expected Outcomes:  Demonstrated interest in learning. Expect positive outcomes  Education material provided: Living Well with Diabetes, Food label handouts, A1C conversion sheet, Meal plan card, My Plate and Carbohydrate counting sheet  If problems or questions, patient to contact team via:  Phone and Email  Future DSME appointment: 4-6 wks

## 2016-09-11 ENCOUNTER — Encounter: Payer: BLUE CROSS/BLUE SHIELD | Attending: Internal Medicine | Admitting: Nutrition

## 2016-09-11 VITALS — Ht 61.0 in | Wt 237.6 lb

## 2016-09-11 DIAGNOSIS — E118 Type 2 diabetes mellitus with unspecified complications: Secondary | ICD-10-CM

## 2016-09-11 NOTE — Patient Instructions (Signed)
Goals 1. Continue to walk 30 mintues a day three times per week    Increase minutes as tolerated toward 45 minutes three times per week. 2. Continue to increase fresh fruits and vegetables. 3. Lose 5 lbs per months. 4. Keep reading foods labels Get A1C down to 7%

## 2016-09-11 NOTE — Progress Notes (Signed)
  Diabetes Self-Management Education  Visit Type:  Follow-up  Appt. Start Time: 1630 Appt. End Time: 1700 26 10/01/2016  [name], identified by name and date of birth, is a 56 y.o. female with a diagnosis of Diabetes:  Marland Kitchen  Making slow and steady progress.BS log brought in. FBS 160-180's before lunch 120-160's and before dinner 118-176 and bedtime 186-216 mg/dl. Lost 5 lbs. She is working on better food choices and started walking. ASSESSMENT  Height 5\' 1"  (1.549 m), weight 237 lb 9.6 oz (107.8 kg), last menstrual period 05/24/2015. Body mass index is 44.89 kg/m. Wt Readings from Last 3 Encounters:  09/11/16 237 lb 9.6 oz (107.8 kg)  09/03/16 242 lb (109.8 kg)  07/26/16 248 lb (112.5 kg)   Ht Readings from Last 3 Encounters:  09/11/16 5\' 1"  (1.549 m)  09/03/16 5\' 1"  (1.549 m)  07/26/16 5\' 1"  (1.549 m)   Body mass index is 44.89 kg/m. @BMIFA @  CMP Latest Ref Rng & Units 03/25/2013 07/15/2009  Glucose 70 - 99 mg/dL 119(H) 124(H)  BUN 6 - 23 mg/dL 10 12  Creatinine 0.50 - 1.10 mg/dL 0.69 0.74  Sodium 135 - 145 mEq/L 135 137  Potassium 3.5 - 5.1 mEq/L 4.3 4.5  Chloride 96 - 112 mEq/L 98 103  CO2 19 - 32 mEq/L 26 27  Calcium 8.4 - 10.5 mg/dL 9.9 8.9  Total Protein 6.0 - 8.3 g/dL 6.9 7.1  Total Bilirubin 0.3 - 1.2 mg/dL <0.1(L) 0.5  Alkaline Phos 39 - 117 U/L 62 55  AST 0 - 37 U/L 27 18  ALT 0 - 35 U/L 21 18      Learning Objective:  Patient will have a greater understanding of diabetes self-management. Patient education plan is to attend individual and/or group sessions per assessed needs and concerns.   Plan:   Patient Instructions  Goals 1. Continue to walk 30 mintues a day three times per week    Increase minutes as tolerated toward 45 minutes three times per week. 2. Continue to increase fresh fruits and vegetables. 3. Lose 5 lbs per months. 4. Keep reading foods labels Get A1C down to 7%      Expected Outcomes:   Improved blood sugars and weight  loss  Education material provided: Meal plan card, My Plate and Carbohydrate counting sheet  If problems or questions, patient to contact team via:  Phone and Email  Future DSME appointment: -  3 months

## 2016-10-22 ENCOUNTER — Encounter: Payer: BLUE CROSS/BLUE SHIELD | Attending: Internal Medicine | Admitting: Nutrition

## 2016-10-22 VITALS — Ht 61.0 in | Wt 240.0 lb

## 2016-10-22 DIAGNOSIS — E1165 Type 2 diabetes mellitus with hyperglycemia: Secondary | ICD-10-CM | POA: Diagnosis not present

## 2016-10-22 DIAGNOSIS — IMO0002 Reserved for concepts with insufficient information to code with codable children: Secondary | ICD-10-CM

## 2016-10-22 DIAGNOSIS — Z713 Dietary counseling and surveillance: Secondary | ICD-10-CM | POA: Diagnosis not present

## 2016-10-22 DIAGNOSIS — E1142 Type 2 diabetes mellitus with diabetic polyneuropathy: Secondary | ICD-10-CM | POA: Diagnosis not present

## 2016-10-22 DIAGNOSIS — E118 Type 2 diabetes mellitus with unspecified complications: Secondary | ICD-10-CM

## 2016-10-22 DIAGNOSIS — I10 Essential (primary) hypertension: Secondary | ICD-10-CM | POA: Diagnosis not present

## 2016-10-22 DIAGNOSIS — E669 Obesity, unspecified: Secondary | ICD-10-CM

## 2016-10-22 NOTE — Progress Notes (Signed)
  Diabetes Self-Management Education  Visit Type:     Appt. Start Time: 1630 Appt. End Time: 1700 26 10/22/2016  [name], identified by name and date of birth, is a 56 y.o. female with a diagnosis of Diabetes:  .  Hasn't been able to walk much due to problems with her left leg/hip area.  Weight up 3 lbs. Has a job that is standing.  BS are now in the 170-180's intead of over 200's.  Feel better. Drinking lemon water. Trying to eat more lower carb vegetables and cut out snacks and processed foods. Going to see MD about her leg in June when she is on vacation from work. Making slow progress. Metformin 1000 mg BID and Januvia. ASSESSMENT  Height 5\' 1"  (1.549 m), weight 237 lb 9.6 oz (107.8 kg), last menstrual period 05/24/2015. Body mass index is 45.35 kg/m. Wt Readings from Last 3 Encounters:  10/22/16 240 lb (108.9 kg)  09/11/16 237 lb 9.6 oz (107.8 kg)  09/03/16 242 lb (109.8 kg)   Ht Readings from Last 3 Encounters:  10/22/16 5\' 1"  (1.549 m)  09/11/16 5\' 1"  (1.549 m)  09/03/16 5\' 1"  (1.549 m)   Body mass index is 45.35 kg/m. @[facility] @  CMP Latest Ref Rng & Units 03/25/2013 07/15/2009  Glucose 70 - 99 mg/dL 119(H) 124(H)  BUN 6 - 23 mg/dL 10 12  Creatinine 0.50 - 1.10 mg/dL 0.69 0.74  Sodium 135 - 145 mEq/L 135 137  Potassium 3.5 - 5.1 mEq/L 4.3 4.5  Chloride 96 - 112 mEq/L 98 103  CO2 19 - 32 mEq/L 26 27  Calcium 8.4 - 10.5 mg/dL 9.9 8.9  Total Protein 6.0 - 8.3 g/dL 6.9 7.1  Total Bilirubin 0.3 - 1.2 mg/dL <0.1(L) 0.5  Alkaline Phos 39 - 117 U/L 62 55  AST 0 - 37 U/L 27 18  ALT 0 - 35 U/L 21 18      Learning Objective:  Patient will have a greater understanding of diabetes self-management. Patient education plan is to attend individual and/or group sessions per assessed needs and concerns.   Plan:   Goals 1. Get back to walking when she can 15-30 minutes a day 2. Eat 2-3 carb choices per meal 3. Cut out bread with breakfast-just do eggs, oatmeal and  fruit 4. Goals Get FBS less than  150 mg/dl. Lose 10 lb in next 2 months  Expected Outcomes:   Improved blood sugars and weight loss  Education material provided: Meal plan card, My Plate and Carbohydrate counting sheet  If problems or questions, patient to contact team via:  Phone and Email  Future DSME appointment: -  3 months

## 2016-10-22 NOTE — Patient Instructions (Signed)
Goals 1. Get back to walking when she can 15-30 minutes a day 2. Eat 2-3 carb choices per meal 3. Cut out bread with breakfast-just do eggs, oatmeal and fruit 4. Goals Get FBS less than  150 mg/dl. Lose 10 lb in next 2 months

## 2016-12-31 ENCOUNTER — Other Ambulatory Visit (HOSPITAL_COMMUNITY): Payer: Self-pay | Admitting: Internal Medicine

## 2016-12-31 ENCOUNTER — Ambulatory Visit (HOSPITAL_COMMUNITY)
Admission: RE | Admit: 2016-12-31 | Discharge: 2016-12-31 | Disposition: A | Discharge status: Home / Self Care | Payer: BLUE CROSS/BLUE SHIELD | Source: Ambulatory Visit | Attending: Internal Medicine | Admitting: Internal Medicine

## 2016-12-31 DIAGNOSIS — M4316 Spondylolisthesis, lumbar region: Secondary | ICD-10-CM | POA: Insufficient documentation

## 2016-12-31 DIAGNOSIS — M5442 Lumbago with sciatica, left side: Secondary | ICD-10-CM

## 2016-12-31 DIAGNOSIS — M5136 Other intervertebral disc degeneration, lumbar region: Secondary | ICD-10-CM | POA: Diagnosis not present

## 2016-12-31 DIAGNOSIS — M545 Low back pain: Secondary | ICD-10-CM | POA: Diagnosis present

## 2016-12-31 DIAGNOSIS — Z1231 Encounter for screening mammogram for malignant neoplasm of breast: Secondary | ICD-10-CM

## 2017-01-01 ENCOUNTER — Other Ambulatory Visit (HOSPITAL_COMMUNITY): Payer: Self-pay | Admitting: Internal Medicine

## 2017-01-01 DIAGNOSIS — M545 Low back pain: Secondary | ICD-10-CM

## 2017-01-05 ENCOUNTER — Ambulatory Visit (HOSPITAL_COMMUNITY)
Admission: RE | Admit: 2017-01-05 | Discharge: 2017-01-05 | Disposition: A | Discharge status: Home / Self Care | Payer: BLUE CROSS/BLUE SHIELD | Source: Ambulatory Visit | Attending: Internal Medicine | Admitting: Internal Medicine

## 2017-01-05 DIAGNOSIS — Z1231 Encounter for screening mammogram for malignant neoplasm of breast: Secondary | ICD-10-CM | POA: Insufficient documentation

## 2017-01-07 ENCOUNTER — Encounter (HOSPITAL_COMMUNITY): Payer: Self-pay

## 2017-01-07 ENCOUNTER — Ambulatory Visit (HOSPITAL_COMMUNITY): Payer: BLUE CROSS/BLUE SHIELD

## 2017-01-21 ENCOUNTER — Ambulatory Visit: Payer: BLUE CROSS/BLUE SHIELD | Admitting: Nutrition

## 2017-01-29 ENCOUNTER — Ambulatory Visit: Payer: BLUE CROSS/BLUE SHIELD | Admitting: Nutrition

## 2017-02-26 ENCOUNTER — Encounter: Payer: BLUE CROSS/BLUE SHIELD | Attending: Internal Medicine | Admitting: Nutrition

## 2017-02-26 VITALS — Ht 61.0 in | Wt 235.0 lb

## 2017-02-26 DIAGNOSIS — E1165 Type 2 diabetes mellitus with hyperglycemia: Secondary | ICD-10-CM

## 2017-02-26 DIAGNOSIS — E669 Obesity, unspecified: Secondary | ICD-10-CM

## 2017-02-26 DIAGNOSIS — E118 Type 2 diabetes mellitus with unspecified complications: Principal | ICD-10-CM

## 2017-02-26 DIAGNOSIS — IMO0002 Reserved for concepts with insufficient information to code with codable children: Secondary | ICD-10-CM

## 2017-02-26 NOTE — Progress Notes (Signed)
  Diabetes Self-Management Education  Visit Type:   f/u  Appt. Start Time: 1600 Appt. End Time: 1630 26 02/26/2017  [name], identified by name and date of birth, is a 56 y.o. female with a diagnosis of Diabetes Type 2:  .  Changes: Cut down on bread, drinking water, increasing more fresh fruits and vegetables. Starting to walk. Has been using fresh fruits and vegetables. Reading foods labels. A1C down to 9% from 11%. Sees PCP in October 2018.  Metformin 1000 mg BID and Janvuvia once a day added 5 mg of Farxiga. Lost 5 lbs. FBS:   100-115 mg/dl Bedtime: 150-170 mg/dl.    ASSESSMENT  Height 5\' 1"  (1.549 m), weight 237 lb 9.6 oz (107.8 kg), last menstrual period 05/24/2015. Body mass index is 44.4 kg/m. Wt Readings from Last 3 Encounters:  02/26/17 235 lb (106.6 kg)  10/22/16 240 lb (108.9 kg)  09/11/16 237 lb 9.6 oz (107.8 kg)   Ht Readings from Last 3 Encounters:  02/26/17 5\' 1"  (1.549 m)  10/22/16 5\' 1"  (1.549 m)  09/11/16 5\' 1"  (1.549 m)   Body mass index is 44.4 kg/m. @BMIFA @  CMP Latest Ref Rng & Units 03/25/2013 07/15/2009  Glucose 70 - 99 mg/dL 119(H) 124(H)  BUN 6 - 23 mg/dL 10 12  Creatinine 0.50 - 1.10 mg/dL 0.69 0.74  Sodium 135 - 145 mEq/L 135 137  Potassium 3.5 - 5.1 mEq/L 4.3 4.5  Chloride 96 - 112 mEq/L 98 103  CO2 19 - 32 mEq/L 26 27  Calcium 8.4 - 10.5 mg/dL 9.9 8.9  Total Protein 6.0 - 8.3 g/dL 6.9 7.1  Total Bilirubin 0.3 - 1.2 mg/dL <0.1(L) 0.5  Alkaline Phos 39 - 117 U/L 62 55  AST 0 - 37 U/L 27 18  ALT 0 - 35 U/L 21 18      Learning Objective:  Patient will have a greater understanding of diabetes self-management. Patient education plan is to attend individual and/or group sessions per assessed needs and concerns.   Plan:   Goals  Keep up the great job! Increase walking as tolerated. Eat 2-3 carb choice per meal.  Lose 1-2 lbs per week. Get A1C down to 7%.  Expected Outcomes:   Improved blood sugars and weight loss  Education  material provided: Meal plan card, My Plate and Carbohydrate counting sheet  If problems or questions, patient to contact team via:  Phone and Email  Future DSME appointment: -  3 months

## 2017-02-26 NOTE — Patient Instructions (Addendum)
Goals  Keep up the great job! Increase walking as tolerated. Eat 2-3 carb choice per meal.  Lose 1-2 lbs per week. Get A1C down to 7%.

## 2017-04-20 DIAGNOSIS — M5416 Radiculopathy, lumbar region: Secondary | ICD-10-CM | POA: Diagnosis not present

## 2017-05-19 DIAGNOSIS — H5212 Myopia, left eye: Secondary | ICD-10-CM | POA: Diagnosis not present

## 2017-05-19 DIAGNOSIS — E119 Type 2 diabetes mellitus without complications: Secondary | ICD-10-CM | POA: Diagnosis not present

## 2017-05-19 DIAGNOSIS — H52203 Unspecified astigmatism, bilateral: Secondary | ICD-10-CM | POA: Diagnosis not present

## 2017-05-19 DIAGNOSIS — H524 Presbyopia: Secondary | ICD-10-CM | POA: Diagnosis not present

## 2017-05-19 DIAGNOSIS — H5201 Hypermetropia, right eye: Secondary | ICD-10-CM | POA: Diagnosis not present

## 2017-05-28 ENCOUNTER — Ambulatory Visit: Payer: BLUE CROSS/BLUE SHIELD | Admitting: Nutrition

## 2017-06-23 DIAGNOSIS — J019 Acute sinusitis, unspecified: Secondary | ICD-10-CM | POA: Diagnosis not present

## 2017-06-23 DIAGNOSIS — I10 Essential (primary) hypertension: Secondary | ICD-10-CM | POA: Diagnosis not present

## 2017-06-23 DIAGNOSIS — E1165 Type 2 diabetes mellitus with hyperglycemia: Secondary | ICD-10-CM | POA: Diagnosis not present

## 2017-07-13 ENCOUNTER — Ambulatory Visit: Payer: BLUE CROSS/BLUE SHIELD | Admitting: Nutrition

## 2017-10-01 DIAGNOSIS — E1142 Type 2 diabetes mellitus with diabetic polyneuropathy: Secondary | ICD-10-CM | POA: Diagnosis not present

## 2017-10-01 DIAGNOSIS — I10 Essential (primary) hypertension: Secondary | ICD-10-CM | POA: Diagnosis not present

## 2017-10-01 DIAGNOSIS — J45901 Unspecified asthma with (acute) exacerbation: Secondary | ICD-10-CM | POA: Diagnosis not present

## 2017-10-02 DIAGNOSIS — I10 Essential (primary) hypertension: Secondary | ICD-10-CM | POA: Diagnosis not present

## 2017-10-02 DIAGNOSIS — J45901 Unspecified asthma with (acute) exacerbation: Secondary | ICD-10-CM | POA: Diagnosis not present

## 2017-10-02 DIAGNOSIS — E1165 Type 2 diabetes mellitus with hyperglycemia: Secondary | ICD-10-CM | POA: Diagnosis not present

## 2017-10-02 DIAGNOSIS — E1142 Type 2 diabetes mellitus with diabetic polyneuropathy: Secondary | ICD-10-CM | POA: Diagnosis not present

## 2017-12-31 ENCOUNTER — Other Ambulatory Visit (HOSPITAL_COMMUNITY): Payer: Self-pay | Admitting: Internal Medicine

## 2017-12-31 DIAGNOSIS — I10 Essential (primary) hypertension: Secondary | ICD-10-CM | POA: Diagnosis not present

## 2017-12-31 DIAGNOSIS — Z0000 Encounter for general adult medical examination without abnormal findings: Secondary | ICD-10-CM | POA: Diagnosis not present

## 2017-12-31 DIAGNOSIS — Z0001 Encounter for general adult medical examination with abnormal findings: Secondary | ICD-10-CM | POA: Diagnosis not present

## 2017-12-31 DIAGNOSIS — E1142 Type 2 diabetes mellitus with diabetic polyneuropathy: Secondary | ICD-10-CM | POA: Diagnosis not present

## 2017-12-31 DIAGNOSIS — E1165 Type 2 diabetes mellitus with hyperglycemia: Secondary | ICD-10-CM | POA: Diagnosis not present

## 2017-12-31 DIAGNOSIS — Z1231 Encounter for screening mammogram for malignant neoplasm of breast: Secondary | ICD-10-CM

## 2018-01-11 ENCOUNTER — Ambulatory Visit (HOSPITAL_COMMUNITY)
Admission: RE | Admit: 2018-01-11 | Discharge: 2018-01-11 | Disposition: A | Discharge status: Home / Self Care | Payer: BLUE CROSS/BLUE SHIELD | Source: Ambulatory Visit | Attending: Internal Medicine | Admitting: Internal Medicine

## 2018-01-11 DIAGNOSIS — Z1231 Encounter for screening mammogram for malignant neoplasm of breast: Secondary | ICD-10-CM | POA: Insufficient documentation

## 2018-03-30 DIAGNOSIS — J4530 Mild persistent asthma, uncomplicated: Secondary | ICD-10-CM | POA: Diagnosis not present

## 2018-03-30 DIAGNOSIS — E1165 Type 2 diabetes mellitus with hyperglycemia: Secondary | ICD-10-CM | POA: Diagnosis not present

## 2018-03-30 DIAGNOSIS — E1142 Type 2 diabetes mellitus with diabetic polyneuropathy: Secondary | ICD-10-CM | POA: Diagnosis not present

## 2018-03-30 DIAGNOSIS — I10 Essential (primary) hypertension: Secondary | ICD-10-CM | POA: Diagnosis not present

## 2018-04-15 DIAGNOSIS — K429 Umbilical hernia without obstruction or gangrene: Secondary | ICD-10-CM | POA: Diagnosis not present

## 2018-04-15 DIAGNOSIS — I10 Essential (primary) hypertension: Secondary | ICD-10-CM | POA: Diagnosis not present

## 2018-04-15 DIAGNOSIS — E1142 Type 2 diabetes mellitus with diabetic polyneuropathy: Secondary | ICD-10-CM | POA: Diagnosis not present

## 2018-05-18 ENCOUNTER — Ambulatory Visit: Payer: BLUE CROSS/BLUE SHIELD | Admitting: General Surgery

## 2018-05-20 DIAGNOSIS — Z794 Long term (current) use of insulin: Secondary | ICD-10-CM | POA: Diagnosis not present

## 2018-05-20 DIAGNOSIS — E119 Type 2 diabetes mellitus without complications: Secondary | ICD-10-CM | POA: Diagnosis not present

## 2018-05-20 DIAGNOSIS — H52203 Unspecified astigmatism, bilateral: Secondary | ICD-10-CM | POA: Diagnosis not present

## 2018-05-20 DIAGNOSIS — H524 Presbyopia: Secondary | ICD-10-CM | POA: Diagnosis not present

## 2018-06-01 ENCOUNTER — Ambulatory Visit: Payer: BLUE CROSS/BLUE SHIELD | Admitting: General Surgery

## 2018-06-01 ENCOUNTER — Encounter: Payer: Self-pay | Admitting: General Surgery

## 2018-06-01 VITALS — BP 187/99 | HR 92 | Temp 96.2°F | Resp 18 | Wt 246.2 lb

## 2018-06-01 DIAGNOSIS — K432 Incisional hernia without obstruction or gangrene: Secondary | ICD-10-CM

## 2018-06-01 NOTE — Patient Instructions (Signed)
Ventral Hernia    A ventral hernia is a bulge of tissue from inside the abdomen that pushes through a weak area of the muscles that form the front wall of the abdomen. The tissues inside the abdomen are inside a sac (peritoneum). These tissues include the small intestine, large intestine, and the fatty tissue that covers the intestines (omentum). Sometimes, the bulge that forms a hernia contains intestines. Other hernias contain only fat. Ventral hernias do not go away without surgical treatment.  There are several types of ventral hernias. You may have:   A hernia at an incision site from previous abdominal surgery (incisional hernia).   A hernia just above the belly button (epigastric hernia), or at the belly button (umbilical hernia). These types of hernias can develop from heavy lifting or straining.   A hernia that comes and goes (reducible hernia). It may be visible only when you lift or strain. This type of hernia can be pushed back into the abdomen (reduced).   A hernia that traps abdominal tissue inside the hernia (incarcerated hernia). This type of hernia does not reduce.   A hernia that cuts off blood flow to the tissues inside the hernia (strangulated hernia). The tissues can start to die if this happens. This is a very painful bulge that cannot be reduced. A strangulated hernia is a medical emergency.  What are the causes?  This condition is caused by abdominal tissue putting pressure on an area of weakness in the abdominal muscles.  What increases the risk?  The following factors may make you more likely to develop this condition:   Being female.   Being 60 or older.   Being overweight or obese.   Having had previous abdominal surgery, especially if there was an infection after surgery.   Having had an injury to the abdominal wall.   Having had several pregnancies.   Having a buildup of fluid inside the abdomen (ascites).  What are the signs or symptoms?  The only symptom of a ventral hernia  may be a painless bulge in the abdomen. A reducible hernia may be visible only when you strain, cough, or lift. Other symptoms may include:   Dull pain.   A feeling of pressure.  Signs and symptoms of a strangulated hernia may include:   Increasing pain.   Nausea and vomiting.   Pain when pressing on the hernia.   The skin over the hernia turning red or purple.   Constipation.   Blood in the stool (feces).  How is this diagnosed?  This condition may be diagnosed based on:   Your symptoms.   Your medical history.   A physical exam. You may be asked to cough or strain while standing. These actions increase the pressure inside your abdomen and force the hernia through the opening in your muscles. Your health care provider may try to reduce the hernia by pressing on it.   Imaging studies, such as an ultrasound or CT scan.  How is this treated?  This condition is treated with surgery. If you have a strangulated hernia, surgery is done as soon as possible. If your hernia is small and not incarcerated, you may be asked to lose some weight before surgery.  Follow these instructions at home:   Follow instructions from your health care provider about eating or drinking restrictions.   If you are overweight, your health care provider may recommend that you increase your activity level and eat a healthier diet.     Do not lift anything that is heavier than 10 lb (4.5 kg).   Return to your normal activities as told by your health care provider. Ask your health care provider what activities are safe for you. You may need to avoid activities that increase pressure on your hernia.   Take over-the-counter and prescription medicines only as told by your health care provider.   Keep all follow-up visits as told by your health care provider. This is important.  Contact a health care provider if:   Your hernia gets larger.   Your hernia becomes painful.  Get help right away if:   Your hernia becomes increasingly  painful.   You have pain along with any of the following:  ? Changes in skin color in the area of the hernia.  ? Nausea.  ? Vomiting.  ? Fever.  Summary   A ventral hernia is a bulge of tissue from inside the abdomen that pushes through a weak area of the muscles that form the front wall of the abdomen.   This condition is treated with surgery, which may be urgent depending on your hernia.   Do not lift anything that is heavier than 10 lb (4.5 kg), and follow activity instructions from your health care provider.  This information is not intended to replace advice given to you by your health care provider. Make sure you discuss any questions you have with your health care provider.  Document Released: 05/12/2012 Document Revised: 07/08/2017 Document Reviewed: 12/15/2016  Elsevier Interactive Patient Education  2019 Elsevier Inc.

## 2018-06-01 NOTE — Progress Notes (Signed)
[name]; [id number]; [birth year]   HPI Patient is a 57 year old black female who was referred to my care by Dr. Legrand Rams for evaluation treatment of a periumbilical hernia.  Patient states that is been present for the past few months.  Is made worse with straining.  Is occasionally sore to touch, but she has had no nausea or vomiting.  She currently has 0 out of 10 abdominal pain.  She denies any fever or chills.  She is status post a laparoscopic cholecystectomy in the remote past. Past Medical History:  Diagnosis Date  . Allergy   . Asthma   . COPD (chronic obstructive pulmonary disease) ([place])   . Diabetes mellitus without complication ([place])   . Hypertension   . Morbid obesity due to excess calories [facility])     Past Surgical History:  Procedure Laterality Date  . CARPAL TUNNEL RELEASE Right   . CHOLECYSTECTOMY    . COLONOSCOPY N/A 01/12/2015   Procedure: COLONOSCOPY;  Surgeon: Danie Binder, MD;  Location: [facility] SUITE;  Service: Endoscopy;  Laterality: N/A;  0830  . HYSTEROSCOPY W/D&C N/A 04/01/2013   Procedure: DILATATION AND CURETTAGE /HYSTEROSCOPY;  Surgeon: Florian Buff, MD;  Location: [facility] ORS;  Service: Gynecology;  Laterality: N/A;  . NASAL POLYP SURGERY      Family History  Problem Relation Age of Onset  . Diabetes Mother   . Kidney disease Mother   . Other Mother        renal failure  . Hypertension Mother   . Diabetes Father   . Cancer Father        pancreatic  . Diabetes Sister   . Kidney disease Sister   . Diabetes Maternal Grandmother   . Asthma Maternal Grandmother   . COPD Maternal Grandmother        bronchitis  . Hypertension Maternal Grandmother   . Thyroid disease Maternal Grandmother   . Colon cancer Neg Hx     Current Outpatient Medications on File Prior to Visit  Medication Sig Dispense Refill  . aspirin EC 81 MG tablet Take 81 mg by mouth daily.    . Fluticasone-Salmeterol (ADVAIR) 250-50 MCG/DOSE AEPB Inhale 1 puff into the lungs 2 (two) times  daily.     . Insulin Glargine (BASAGLAR KWIKPEN) 100 UNIT/ML SOPN Inject into the skin daily.    Marland Kitchen ipratropium-albuterol (DUONEB) 0.5-2.5 (3) MG/3ML SOLN Take 3 mLs by nebulization every 4 (four) hours as needed (Shortness of Breath).     Marland Kitchen JANUVIA 100 MG tablet Take 100 mg by mouth daily.  0  . lisinopril (PRINIVIL,ZESTRIL) 40 MG tablet Take 40 mg by mouth daily.    . metFORMIN (GLUCOPHAGE) 500 MG tablet Take 1,000 mg by mouth 2 (two) times daily with a meal.     . mometasone (NASONEX) 50 MCG/ACT nasal spray Two sprays to each nostril daily 17 g 12  . Multiple Vitamin (MULTIVITAMIN WITH MINERALS) TABS tablet Take 1 tablet by mouth daily.    . rosuvastatin (CRESTOR) 10 MG tablet Take 10 mg by mouth daily.    . traMADol (ULTRAM) 50 MG tablet Take 50 mg by mouth 2 (two) times daily.  0   No current facility-administered medications on file prior to visit.     No Known Allergies  Social History   Substance and Sexual Activity  Alcohol Use No  . Alcohol/week: 0.0 standard drinks    Social History   Tobacco Use  Smoking Status Former Smoker  . Packs/day:  0.50  . Years: 20.00  . Pack years: 10.00  . Types: Cigarettes  . Last attempt to quit: 03/25/1997  . Years since quitting: 21.2  Smokeless Tobacco Never Used    Review of Systems  Constitutional: Negative.   HENT: Negative.   Eyes: Negative.   Respiratory: Positive for wheezing.   Cardiovascular: Negative.   Gastrointestinal: Negative.   Genitourinary: Positive for frequency.  Musculoskeletal: Positive for back pain.  Skin: Negative.   Neurological: Negative.   Endo/Heme/Allergies: Negative.   Psychiatric/Behavioral: Negative.     Objective   Vitals:   06/01/18 0913  BP: (!) 187/99  Pulse: 92  Resp: 18  Temp: (!) 96.2 F (35.7 C)    Physical Exam Vitals signs reviewed.  Constitutional:      Appearance: Normal appearance. She is obese. She is not ill-appearing.  HENT:     Head: Normocephalic and  atraumatic.  Cardiovascular:     Rate and Rhythm: Normal rate and regular rhythm.     Heart sounds: Normal heart sounds. No murmur. No gallop.   Pulmonary:     Effort: Pulmonary effort is normal. No respiratory distress.     Breath sounds: Normal breath sounds. No stridor. No wheezing, rhonchi or rales.  Abdominal:     General: Abdomen is flat. Bowel sounds are normal. There is no distension.     Palpations: Abdomen is soft. There is no mass.     Tenderness: There is no abdominal tenderness. There is no guarding or rebound.     Hernia: A hernia is present.     Comments: Reducible supraumbilical hernia present below a surgical scar.  Skin:    General: Skin is warm and dry.  Neurological:     Mental Status: She is alert and oriented to person, place, and time.     Assessment  Incisional hernia Plan   Patient will call to schedule incisional herniorrhaphy with mesh.  The risks and benefits of the procedure including bleeding, infection, mesh use, and the possibility of recurrence of the hernia were fully explained to the patient, who gave informed consent.  Her incarceration risk is low but the signs and symptoms were explained.

## 2018-07-01 DIAGNOSIS — J4530 Mild persistent asthma, uncomplicated: Secondary | ICD-10-CM | POA: Diagnosis not present

## 2018-07-01 DIAGNOSIS — I10 Essential (primary) hypertension: Secondary | ICD-10-CM | POA: Diagnosis not present

## 2018-07-01 DIAGNOSIS — E1142 Type 2 diabetes mellitus with diabetic polyneuropathy: Secondary | ICD-10-CM | POA: Diagnosis not present

## 2018-09-23 DIAGNOSIS — I10 Essential (primary) hypertension: Secondary | ICD-10-CM | POA: Diagnosis not present

## 2018-09-23 DIAGNOSIS — E1142 Type 2 diabetes mellitus with diabetic polyneuropathy: Secondary | ICD-10-CM | POA: Diagnosis not present

## 2018-09-23 DIAGNOSIS — J4530 Mild persistent asthma, uncomplicated: Secondary | ICD-10-CM | POA: Diagnosis not present

## 2018-11-19 ENCOUNTER — Telehealth: Payer: Self-pay | Admitting: *Deleted

## 2018-11-19 DIAGNOSIS — Z20822 Contact with and (suspected) exposure to covid-19: Secondary | ICD-10-CM

## 2018-11-19 NOTE — Telephone Encounter (Signed)
Pt schedued for covid testing for Monday 11/22/2018 at 1545.   Requested by Dr. Ronni Rumble  Pt with positive exposure. Pts CB# [place] # [place] process reviewed, pt verbalizes understanding.

## 2018-11-22 ENCOUNTER — Other Ambulatory Visit: Payer: BLUE CROSS/BLUE SHIELD

## 2018-11-22 DIAGNOSIS — Z20822 Contact with and (suspected) exposure to covid-19: Secondary | ICD-10-CM

## 2018-11-22 DIAGNOSIS — R6889 Other general symptoms and signs: Secondary | ICD-10-CM | POA: Diagnosis not present

## 2018-11-25 ENCOUNTER — Telehealth: Payer: Self-pay | Admitting: Internal Medicine

## 2018-11-25 LAB — NOVEL CORONAVIRUS, NAA: SARS-CoV-2, NAA: NOT DETECTED

## 2018-11-25 NOTE — Telephone Encounter (Signed)
This letter has been faxed.

## 2018-11-25 NOTE — Telephone Encounter (Signed)
Pt would like a copy of her covid results faxed to her dr, Dr Legrand Rams at fax number: [phone]  Pt would like to return to work tomorrow and needs results on paper in order to return.  Pt declined mychart account at this time.

## 2018-12-28 DIAGNOSIS — E1165 Type 2 diabetes mellitus with hyperglycemia: Secondary | ICD-10-CM | POA: Diagnosis not present

## 2018-12-28 DIAGNOSIS — I10 Essential (primary) hypertension: Secondary | ICD-10-CM | POA: Diagnosis not present

## 2018-12-28 DIAGNOSIS — Z0001 Encounter for general adult medical examination with abnormal findings: Secondary | ICD-10-CM | POA: Diagnosis not present

## 2018-12-28 DIAGNOSIS — J4530 Mild persistent asthma, uncomplicated: Secondary | ICD-10-CM | POA: Diagnosis not present

## 2019-03-08 ENCOUNTER — Telehealth: Payer: Self-pay | Admitting: Adult Health

## 2019-03-08 NOTE — Telephone Encounter (Signed)
Called patient regarding appointment and the following message was left:   We have you scheduled for an upcoming appointment at our office. At this time, patients are encouraged to come alone to their visits whenever possible, however, a support person, over age 58, may accompany you to your appointment if assistance is needed for safety or care concerns. Otherwise, support persons should remain outside until the visit is complete.   We ask if you have had any exposure to anyone suspected or confirmed of having COVID-19 or if you are experiencing any of the following, to call and reschedule your appointment: fever, cough, shortness of breath, muscle pain, diarrhea, rash, vomiting, abdominal pain, red eye, weakness, bruising, bleeding, joint pain, or a severe headache.   Please know we will ask you these questions or similar questions when you arrive for your appointment and again its how we are keeping everyone safe.    Also,to keep you safe, please use the provided hand sanitizer when you enter the office. We are asking everyone in the office to wear a mask to help prevent the spread of germs. If you have a mask of your own, please wear it to your appointment, if not, we are happy to provide one for you.  Thank you for understanding and your cooperation.    [facility] Staff

## 2019-03-09 ENCOUNTER — Ambulatory Visit: Payer: BC Managed Care – PPO | Admitting: Adult Health

## 2019-03-09 ENCOUNTER — Other Ambulatory Visit: Payer: Self-pay

## 2019-03-09 ENCOUNTER — Encounter: Payer: Self-pay | Admitting: Adult Health

## 2019-03-09 VITALS — BP 152/77 | HR 84 | Ht 61.0 in | Wt 230.0 lb

## 2019-03-09 DIAGNOSIS — N950 Postmenopausal bleeding: Secondary | ICD-10-CM | POA: Diagnosis not present

## 2019-03-09 DIAGNOSIS — R102 Pelvic and perineal pain: Secondary | ICD-10-CM | POA: Diagnosis not present

## 2019-03-09 NOTE — Patient Instructions (Signed)
Postmenopausal Bleeding  Postmenopausal bleeding is any bleeding that occurs after menopause. Menopause is when a woman's period stops. Any type of bleeding after menopause should be checked by your doctor. Treatment will depend on the cause. Follow these instructions at home:  Pay attention to any changes in your symptoms.  Avoid using tampons and douches as told by your doctor.  Change your pads regularly.  Get regular pelvic exams and Pap tests.  Take iron pills as told by your doctor.  Take over-the-counter and prescription medicines only as told by your doctor.  Keep all follow-up visits as told by your doctor. This is important. Contact a doctor if:  Your bleeding lasts for more than 1 week.  You have pain in your belly (abdomen).  You have bleeding during or after sex.  You have bleeding that happens more often than every 3 weeks. Get help right away if:  You have fever, chills, headache, dizziness, muscle aches, or bleeding.  You have very bad pain with bleeding.  You have clumps of blood (blood clots) coming from your vagina.  You have a lot of bleeding, and: ? You use more than 1 pad an hour. ? This kind of bleeding has never happened before.  You feel like you are going to pass out (faint). Summary  Any type of bleeding after menopause should be checked by your doctor.  Pay attention to any changes in your symptoms.  Keep all follow-up visits as told by your doctor. This information is not intended to replace advice given to you by your health care provider. Make sure you discuss any questions you have with your health care provider. Document Released: 03/04/2008 Document Revised: 08/12/2018 Document Reviewed: 07/01/2016 Elsevier Patient Education  2020 Elsevier Inc.  

## 2019-03-09 NOTE — Progress Notes (Signed)
Patient ID: [name], female   DOB: 08-Mar-1961, 58 y.o.   MRN: [id number] History of Present Illness: [name] is a 58 year old black female, married, PM, in complaining of vaginal bleeding x 1 day on 9/18. She had episode in 2016 and ECC was 2.8 mm and she took provera for a while and no bleeding til 9/18, and she has lower pelvic pain. Her last pap was in 2016. PCP is Dr Legrand Rams.    Current Medications, Allergies, Past Medical History, Past Surgical History, Family History and Social History were reviewed in [employer] record.     Review of Systems: Vaginal bleeding x 1 day, PM +pelvic pain    Physical Exam:BP (!) 152/77 (BP Location: Right Arm, Patient Position: Sitting, Cuff Size: Large)   Pulse 84   Ht 5\' 1"  (1.549 m)   Wt 230 lb (104.3 kg)   LMP 02/25/2019   BMI 43.46 kg/m  General:  Well developed, well nourished, no acute distress Skin:  Warm and dry +umbilcial hernia  Pelvic:  External genitalia is normal in appearance, no lesions.  The vagina is normal in appearance. Urethra has no lesions or masses. The cervix is poorly seen, but appears smooth.  Uterus is felt to be normal size, shape, and contour.  No adnexal masses or tenderness noted.Bladder is non tender, no masses felt. Psych:  No mood changes, alert and cooperative,seems happy Fall risk is low  Examination chaperoned by Levy Pupa LPN. Discussed will get [place] to assess uterus, and could be fine, could be polyp or even endometrial thickness that could be cancer.May need endometrial biopsy if has endometrial thickness.  Face time 20 minutes with 50% counseling.   Impression and Plan: 1. PMB (postmenopausal bleeding) -will get GYN [place] in a week -review handout on PMB  2. Pelvic pain -will get [place] in 1 week -will see pt back in 2-3 days after [place] and do pap and physical

## 2019-03-16 ENCOUNTER — Telehealth: Payer: Self-pay | Admitting: Obstetrics & Gynecology

## 2019-03-16 NOTE — Telephone Encounter (Signed)
We have you scheduled for an upcoming appointment at our office. At this time, we are still not allowing visitors or children during your appointment, however, a support person, over age 58, may accompany you to your appointment if assistance is needed for safety or care concerns. Otherwise, support persons should remain outside until the visit is complete.   We ask if you have had any exposure to anyone suspected or confirmed of having COVID-19 or if you are experiencing any of the following, to call and reschedule your appointment: fever, cough, shortness of breath, muscle pain, diarrhea, rash, vomiting, abdominal pain, red eye, weakness, bruising, bleeding, joint pain, or a severe headache.   Please know we will ask you these questions or similar questions when you arrive for your appointment and again its how we are keeping everyone safe.    Also,to keep you safe, please use the provided hand sanitizer when you enter the office. We are asking everyone in the office to wear a mask to help prevent the spread of germs. If you have a mask of your own, please wear it to your appointment, if not, we are happy to provide one for you.  Thank you for understanding and your cooperation.    [facility] Staff

## 2019-03-17 ENCOUNTER — Other Ambulatory Visit: Payer: Self-pay

## 2019-03-17 ENCOUNTER — Ambulatory Visit (INDEPENDENT_AMBULATORY_CARE_PROVIDER_SITE_OTHER): Payer: BC Managed Care – PPO

## 2019-03-17 DIAGNOSIS — N950 Postmenopausal bleeding: Secondary | ICD-10-CM

## 2019-03-17 DIAGNOSIS — R102 Pelvic and perineal pain: Secondary | ICD-10-CM

## 2019-03-17 NOTE — Progress Notes (Signed)
PELVIC US TA/TV:enlarged heterogeneous anteverted uterus with mult.fibroids,(#1) fundal subserosal fibroid 3.2 x 2.8 x 3.2 cm,(#2) anterior mid intramural fibroid 2.4 x 1.8 x 2 cm, normal ovaries bilat,limited view,no free fluid,no pain during ultrasound

## 2019-03-18 ENCOUNTER — Telehealth: Payer: Self-pay | Admitting: Adult Health

## 2019-03-18 NOTE — Telephone Encounter (Signed)
We have you scheduled for an upcoming appointment at our office. At this time, we are still not allowing visitors or children during your appointment, however, a support person, over age 58, may accompany you to your appointment if assistance is needed for safety or care concerns. Otherwise, support persons should remain outside until the visit is complete.   We ask if you have had any exposure to anyone suspected or confirmed of having COVID-19 or if you are experiencing any of the following, to call and reschedule your appointment: fever, cough, shortness of breath, muscle pain, diarrhea, rash, vomiting, abdominal pain, red eye, weakness, bruising, bleeding, joint pain, or a severe headache.   Please know we will ask you these questions or similar questions when you arrive for your appointment and again its how we are keeping everyone safe.    Also,to keep you safe, please use the provided hand sanitizer when you enter the office. We are asking everyone in the office to wear a mask to help prevent the spread of germs. If you have a mask of your own, please wear it to your appointment, if not, we are happy to provide one for you.  Thank you for understanding and your cooperation.    [facility] Staff

## 2019-03-21 ENCOUNTER — Other Ambulatory Visit (HOSPITAL_COMMUNITY)
Admission: RE | Admit: 2019-03-21 | Discharge: 2019-03-21 | Disposition: A | Discharge status: Home / Self Care | Payer: BC Managed Care – PPO | Source: Ambulatory Visit | Attending: Adult Health | Admitting: Adult Health

## 2019-03-21 ENCOUNTER — Encounter: Payer: Self-pay | Admitting: Adult Health

## 2019-03-21 ENCOUNTER — Ambulatory Visit (INDEPENDENT_AMBULATORY_CARE_PROVIDER_SITE_OTHER): Payer: BC Managed Care – PPO | Admitting: Adult Health

## 2019-03-21 ENCOUNTER — Other Ambulatory Visit: Payer: Self-pay

## 2019-03-21 VITALS — BP 155/92 | HR 88 | Ht 62.0 in | Wt 228.4 lb

## 2019-03-21 DIAGNOSIS — Z1211 Encounter for screening for malignant neoplasm of colon: Secondary | ICD-10-CM | POA: Diagnosis not present

## 2019-03-21 DIAGNOSIS — R9389 Abnormal findings on diagnostic imaging of other specified body structures: Secondary | ICD-10-CM

## 2019-03-21 DIAGNOSIS — Z01419 Encounter for gynecological examination (general) (routine) without abnormal findings: Secondary | ICD-10-CM

## 2019-03-21 DIAGNOSIS — D219 Benign neoplasm of connective and other soft tissue, unspecified: Secondary | ICD-10-CM

## 2019-03-21 DIAGNOSIS — Z1212 Encounter for screening for malignant neoplasm of rectum: Secondary | ICD-10-CM

## 2019-03-21 DIAGNOSIS — N950 Postmenopausal bleeding: Secondary | ICD-10-CM

## 2019-03-21 LAB — HEMOCCULT GUIAC POC 1CARD (OFFICE): Fecal Occult Blood, POC: NEGATIVE

## 2019-03-21 NOTE — Progress Notes (Signed)
Patient ID: [name], female   DOB: 1960/11/12, 58 y.o.   MRN: [id number] History of Present Illness: [name] is a 58 year old black female, married, PM in for a well woman gyn exam and pap, and discuss US done for PMB. PCP is Dr Legrand Rams.    Current Medications, Allergies, Past Medical History, Past Surgical History, Family History and Social History were reviewed in [employer] record.     Review of Systems: Patient denies any headaches, hearing loss, fatigue, blurred vision, shortness of breath, chest pain, abdominal pain, problems with bowel movements, urination, or intercourse. No joint pain or mood swings.   Physical Exam:BP (!) 155/92 (BP Location: Right Arm, Patient Position: Sitting, Cuff Size: Large)   Pulse 88   Ht 5\' 2"  (1.575 m)   Wt 228 lb 6.4 oz (103.6 kg)   LMP 02/25/2019   BMI 41.77 kg/m  General:  Well developed, well nourished, no acute distress Skin:  Warm and dry Neck:  Midline trachea, normal thyroid, good ROM, no lymphadenopathy Lungs; Clear to auscultation bilaterally Breast:  No dominant palpable mass, retraction, or nipple discharge Cardiovascular: Regular rate and rhythm Abdomen:  Soft, non tender, no hepatosplenomegaly Pelvic:  External genitalia is normal in appearance, no lesions.  The vagina is normal in appearance. Urethra has no lesions or masses. 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  Uterus is felt to be slightly enlarged,non tender..  No adnexal masses or tenderness noted.Bladder is non tender, no masses felt. Rectal: Good sphincter tone, no polyps, or hemorrhoids felt.  Hemoccult negative. Extremities/musculoskeletal:  No swelling or varicosities noted, no clubbing or cyanosis Psych:  No mood changes, alert and cooperative,seems happy Fall risk is low PHQ 2 score is 0. Examination chaperoned by Rolena Infante LPN Discussed that US showed enlarged uterus with multiple fibroids and EEC  is thickened at 7.1 mm, ovaries look normal on limited view, will need endometrial biopsy. Discussed could be, nothing or  Polyp, and  possibility for endometrial cancer.    Impression and Plan: 1. Encounter for gynecological examination with Papanicolaou smear of cervix -pap sent -physical in 1 year -Pap in 3 if normal - get mammogram yearly  2. PMB (postmenopausal bleeding)   3. Screening for colorectal cancer   4. Thickened endometrium -return in 1 week for endometrial biopsy   5. Fibroids

## 2019-03-29 ENCOUNTER — Telehealth: Payer: Self-pay | Admitting: Obstetrics and Gynecology

## 2019-03-29 LAB — CYTOLOGY - PAP
Comment: NEGATIVE
Diagnosis: NEGATIVE
High risk HPV: NEGATIVE

## 2019-03-29 NOTE — Telephone Encounter (Signed)

## 2019-03-30 ENCOUNTER — Ambulatory Visit (INDEPENDENT_AMBULATORY_CARE_PROVIDER_SITE_OTHER): Payer: BC Managed Care – PPO | Admitting: Obstetrics and Gynecology

## 2019-03-30 ENCOUNTER — Other Ambulatory Visit: Payer: Self-pay

## 2019-03-30 ENCOUNTER — Encounter: Payer: Self-pay | Admitting: Obstetrics and Gynecology

## 2019-03-30 ENCOUNTER — Other Ambulatory Visit: Payer: Self-pay | Admitting: Obstetrics and Gynecology

## 2019-03-30 VITALS — BP 137/79 | HR 87 | Ht 61.0 in | Wt 233.1 lb

## 2019-03-30 DIAGNOSIS — N858 Other specified noninflammatory disorders of uterus: Secondary | ICD-10-CM | POA: Diagnosis not present

## 2019-03-30 DIAGNOSIS — N840 Polyp of corpus uteri: Secondary | ICD-10-CM | POA: Diagnosis not present

## 2019-03-30 DIAGNOSIS — N950 Postmenopausal bleeding: Secondary | ICD-10-CM

## 2019-03-30 NOTE — Progress Notes (Signed)
Patient ID: [name], female   DOB: [birth year], 58 y.o.   MRN: [id number]  S: PMB   Patient given informed consent, signed copy in the chart, time out was performed. Appropriate time out taken. . The patient was placed in the lithotomy position and the cervix brought into view with sterile large snowman speculum.  Portio of cervix cleansed x 2 with betadine swabs.  A tenaculum was placed in the anterior lip of the cervix.  The uterus was sounded for depth of 12 cm. A pipelle was introduced to into the uterus, suction created, throrough effort was made and a sacnty amount of  endometrial sample was obtained. All equipment was removed and accounted for.  The patient tolerated the procedure well.  She is given Ibuprofen and H2O upon completion of procedure.  Patient given post procedure instructions. The patient will return in 2 weeks for results.  By signing my name below, I, Samul Dada, attest that this documentation has been prepared under the direction and in the presence of Jonnie Kind, MD. Electronically Signed: Calvin. 03/30/19. 4:22 PM.  .

## 2019-03-30 NOTE — Addendum Note (Signed)
Addended by: Diona Fanti A on: 03/30/2019 04:44 PM   Modules accepted: Orders

## 2019-04-04 ENCOUNTER — Telehealth: Payer: Self-pay | Admitting: Obstetrics and Gynecology

## 2019-04-04 ENCOUNTER — Other Ambulatory Visit: Payer: Self-pay | Admitting: Obstetrics and Gynecology

## 2019-04-04 MED ORDER — MEDROXYPROGESTERONE ACETATE 10 MG PO TABS: 10.0000 mg | ORAL_TABLET | Freq: Every day | ORAL | 3 refills | Status: DC

## 2019-04-04 NOTE — Telephone Encounter (Signed)
Pathology report shows benign secretory endometrium and a 58 year old.  It is it is described as "degenerating secretory endometrium".  Will administer Provera 10 mg daily x14 days for 3 consecutive months to see if she will withdraw and then gradually reduce her response to the Provera.  Message left with patient informing her of the plan.  She is to call our office for further questions

## 2019-04-19 DIAGNOSIS — I10 Essential (primary) hypertension: Secondary | ICD-10-CM | POA: Diagnosis not present

## 2019-04-19 DIAGNOSIS — E1142 Type 2 diabetes mellitus with diabetic polyneuropathy: Secondary | ICD-10-CM | POA: Diagnosis not present

## 2019-04-19 DIAGNOSIS — J4530 Mild persistent asthma, uncomplicated: Secondary | ICD-10-CM | POA: Diagnosis not present

## 2019-04-21 ENCOUNTER — Other Ambulatory Visit (HOSPITAL_COMMUNITY)
Admission: RE | Admit: 2019-04-21 | Discharge: 2019-04-21 | Disposition: A | Discharge status: Home / Self Care | Payer: BC Managed Care – PPO | Source: Ambulatory Visit | Attending: Internal Medicine | Admitting: Internal Medicine

## 2019-04-21 DIAGNOSIS — E1142 Type 2 diabetes mellitus with diabetic polyneuropathy: Secondary | ICD-10-CM | POA: Diagnosis not present

## 2019-04-21 DIAGNOSIS — Z0001 Encounter for general adult medical examination with abnormal findings: Secondary | ICD-10-CM | POA: Diagnosis not present

## 2019-04-21 DIAGNOSIS — I10 Essential (primary) hypertension: Secondary | ICD-10-CM | POA: Insufficient documentation

## 2019-04-21 LAB — BASIC METABOLIC PANEL
Anion gap: 13 (ref 5–15)
BUN: 15 mg/dL (ref 6–20)
CO2: 23 mmol/L (ref 22–32)
Calcium: 10.1 mg/dL (ref 8.9–10.3)
Chloride: 103 mmol/L (ref 98–111)
Creatinine, Ser: 0.68 mg/dL (ref 0.44–1.00)
GFR calc Af Amer: >60 mL/min (ref >60)
GFR calc non Af Amer: >60 mL/min (ref >60)
Glucose, Bld: 295 mg/dL — ABNORMAL HIGH (ref 70–99)
Potassium: 4.3 mmol/L (ref 3.5–5.1)
Sodium: 139 mmol/L (ref 135–145)

## 2019-04-21 LAB — LIPID PANEL
Cholesterol: 134 mg/dL (ref 0–200)
HDL: 38 mg/dL — ABNORMAL LOW (ref >40)
LDL Cholesterol: 68 mg/dL (ref 0–99)
Total CHOL/HDL Ratio: 3.5 RATIO
Triglycerides: 139 mg/dL (ref <150)
VLDL: 28 mg/dL (ref 0–40)

## 2019-04-21 LAB — CBC WITH DIFFERENTIAL/PLATELET
Abs Immature Granulocytes: 0.02 10*3/uL (ref 0.00–0.07)
Basophils Absolute: 0 10*3/uL (ref 0.0–0.1)
Basophils Relative: 1 %
Eosinophils Absolute: 0.2 10*3/uL (ref 0.0–0.5)
Eosinophils Relative: 3 %
HCT: 47 % — ABNORMAL HIGH (ref 36.0–46.0)
Hemoglobin: 15.5 g/dL — ABNORMAL HIGH (ref 12.0–15.0)
Immature Granulocytes: 0 %
Lymphocytes Relative: 28 %
Lymphs Abs: 2 10*3/uL (ref 0.7–4.0)
MCH: 27.6 pg (ref 26.0–34.0)
MCHC: 33 g/dL (ref 30.0–36.0)
MCV: 83.8 fL (ref 80.0–100.0)
Monocytes Absolute: 0.6 10*3/uL (ref 0.1–1.0)
Monocytes Relative: 9 %
Neutro Abs: 4.3 10*3/uL (ref 1.7–7.7)
Neutrophils Relative %: 59 %
Platelets: 296 10*3/uL (ref 150–400)
RBC: 5.61 MIL/uL — ABNORMAL HIGH (ref 3.87–5.11)
RDW: 12 % (ref 11.5–15.5)
WBC: 7.2 10*3/uL (ref 4.0–10.5)
nRBC: 0 % (ref 0.0–0.2)

## 2019-04-21 LAB — HEPATIC FUNCTION PANEL
ALT: 26 U/L (ref 0–44)
AST: 27 U/L (ref 15–41)
Albumin: 4.4 g/dL (ref 3.5–5.0)
Alkaline Phosphatase: 67 U/L (ref 38–126)
Bilirubin, Direct: <0.1 mg/dL (ref 0.0–0.2)
Total Bilirubin: 0.6 mg/dL (ref 0.3–1.2)
Total Protein: 7.7 g/dL (ref 6.5–8.1)

## 2019-04-21 LAB — HEMOGLOBIN A1C
Hgb A1c MFr Bld: 12.9 % — ABNORMAL HIGH (ref 4.8–5.6)
Mean Plasma Glucose: 323.53 mg/dL

## 2019-04-22 LAB — MICROALBUMIN, URINE: Microalb, Ur: 122.3 ug/mL — ABNORMAL HIGH

## 2019-05-26 ENCOUNTER — Other Ambulatory Visit (HOSPITAL_COMMUNITY): Payer: Self-pay | Admitting: Adult Health

## 2019-05-26 DIAGNOSIS — H52203 Unspecified astigmatism, bilateral: Secondary | ICD-10-CM | POA: Diagnosis not present

## 2019-05-26 DIAGNOSIS — Z1231 Encounter for screening mammogram for malignant neoplasm of breast: Secondary | ICD-10-CM

## 2019-05-26 DIAGNOSIS — E119 Type 2 diabetes mellitus without complications: Secondary | ICD-10-CM | POA: Diagnosis not present

## 2019-05-26 DIAGNOSIS — H524 Presbyopia: Secondary | ICD-10-CM | POA: Diagnosis not present

## 2019-05-26 DIAGNOSIS — Z794 Long term (current) use of insulin: Secondary | ICD-10-CM | POA: Diagnosis not present

## 2019-05-30 ENCOUNTER — Ambulatory Visit: Payer: BC Managed Care – PPO | Admitting: "Endocrinology

## 2019-05-30 ENCOUNTER — Other Ambulatory Visit: Payer: Self-pay

## 2019-05-30 ENCOUNTER — Encounter: Payer: Self-pay | Admitting: "Endocrinology

## 2019-05-30 VITALS — BP 130/79 | HR 87 | Ht 61.0 in | Wt 217.0 lb

## 2019-05-30 DIAGNOSIS — I10 Essential (primary) hypertension: Secondary | ICD-10-CM

## 2019-05-30 DIAGNOSIS — E782 Mixed hyperlipidemia: Secondary | ICD-10-CM | POA: Insufficient documentation

## 2019-05-30 DIAGNOSIS — E1165 Type 2 diabetes mellitus with hyperglycemia: Secondary | ICD-10-CM

## 2019-05-30 NOTE — Patient Instructions (Signed)

## 2019-05-30 NOTE — Progress Notes (Signed)
Endocrinology Consult Note       05/30/2019, 5:20 PM   Subjective:    Patient ID: [name], female    DOB: [birth year].  [name] is being seen in consultation for management of currently uncontrolled symptomatic diabetes requested by  Rosita Fire, MD.   Past Medical History:  Diagnosis Date  . Allergy   . Asthma   . COPD (chronic obstructive pulmonary disease) ([place])   . Diabetes mellitus without complication ([place])   . Hypertension   . Morbid obesity due to excess calories ([place])   . Umbilical hernia     Past Surgical History:  Procedure Laterality Date  . CARPAL TUNNEL RELEASE Right   . CHOLECYSTECTOMY    . COLONOSCOPY N/A 01/12/2015   Procedure: COLONOSCOPY;  Surgeon: Danie Binder, MD;  Location: [facility];  Service: Endoscopy;  Laterality: N/A;  0830  . HYSTEROSCOPY WITH D & C N/A 04/01/2013   Procedure: DILATATION AND CURETTAGE /HYSTEROSCOPY;  Surgeon: Florian Buff, MD;  Location: [facility] ORS;  Service: Gynecology;  Laterality: N/A;  . NASAL POLYP SURGERY      Social History   Socioeconomic History  . Marital status: Married    Spouse name: Not on file  . Number of children: 2  . Years of education: Not on file  . Highest education level: Not on file  Occupational History  . Not on file  Tobacco Use  . Smoking status: Former Smoker    Packs/day: 0.50    Years: 20.00    Pack years: 10.00    Types: Cigarettes    Quit date: 03/25/1997    Years since quitting: 22.1  . Smokeless tobacco: Never Used  Substance and Sexual Activity  . Alcohol use: Yes    Alcohol/week: 0.0 standard drinks    Comment: Ocassional  . Drug use: No  . Sexual activity: Yes    Birth control/protection: None, Post-menopausal  Other Topics Concern  . Not on file  Social History Narrative  . Not on file   Social Determinants of Health   Financial Resource Strain:   . Difficulty of Paying  Living Expenses: Not on file  Food Insecurity:   . Worried About Charity fundraiser in the Last Year: Not on file  . Ran Out of Food in the Last Year: Not on file  Transportation Needs:   . Lack of Transportation (Medical): Not on file  . Lack of Transportation (Non-Medical): Not on file  Physical Activity:   . Days of Exercise per Week: Not on file  . Minutes of Exercise per Session: Not on file  Stress:   . Feeling of Stress : Not on file  Social Connections:   . Frequency of Communication with Friends and Family: Not on file  . Frequency of Social Gatherings with Friends and Family: Not on file  . Attends Religious Services: Not on file  . Active Member of Clubs or Organizations: Not on file  . Attends Archivist Meetings: Not on file  . Marital Status: Not on file    Family History  Problem Relation Age of  Onset  . Diabetes Mother   . Kidney disease Mother   . Other Mother        renal failure  . Hypertension Mother   . Cancer Mother        ?uterine cancer   . Diabetes Father   . Cancer Father        pancreatic  . Diabetes Sister   . Kidney disease Sister   . Diabetes Maternal Grandmother   . Asthma Maternal Grandmother   . COPD Maternal Grandmother        bronchitis  . Hypertension Maternal Grandmother   . Thyroid disease Maternal Grandmother   . Hypertension Daughter   . Colon cancer Neg Hx     Outpatient Encounter Medications as of 05/30/2019  Medication Sig  . aspirin EC 81 MG tablet Take 81 mg by mouth daily.  . fluticasone (FLONASE) 50 MCG/ACT nasal spray Place 2 sprays into both nostrils daily.  . Fluticasone-Salmeterol (ADVAIR) 250-50 MCG/DOSE AEPB Inhale 1 puff into the lungs 2 (two) times daily.   . Insulin Glargine (BASAGLAR KWIKPEN) 100 UNIT/ML SOPN Inject 80 Units into the skin at bedtime.  Marland Kitchen JANUVIA 100 MG tablet Take 100 mg by mouth daily.  Marland Kitchen lisinopril (PRINIVIL,ZESTRIL) 40 MG tablet Take 40 mg by mouth daily.  . medroxyPROGESTERone  (PROVERA) 10 MG tablet Take 1 tablet (10 mg total) by mouth daily. One tablet daily x 2 weeks. Repeat as directed  . metFORMIN (GLUCOPHAGE) 500 MG tablet Take 1,000 mg by mouth 2 (two) times daily with a meal.   . rosuvastatin (CRESTOR) 20 MG tablet Take 20 mg by mouth daily.   . traMADol (ULTRAM) 50 MG tablet Take 50 mg by mouth 2 (two) times daily.  . diclofenac sodium (VOLTAREN) 1 % GEL 4 (four) times daily as needed.  Marland Kitchen ipratropium-albuterol (DUONEB) 0.5-2.5 (3) MG/3ML SOLN Take 3 mLs by nebulization every 4 (four) hours as needed (Shortness of Breath).   . Multiple Vitamin (MULTIVITAMIN WITH MINERALS) TABS tablet Take 1 tablet by mouth daily.  . [DISCONTINUED] mometasone (NASONEX) 50 MCG/ACT nasal spray Two sprays to each nostril daily   No facility-administered encounter medications on file as of 05/30/2019.    ALLERGIES: No Known Allergies  VACCINATION STATUS: Immunization History  Administered Date(s) Administered  . Influenza-Unspecified 06/09/2010, 06/09/2013    Diabetes She presents for her initial diabetic visit. She has type 2 diabetes mellitus. Onset time: She was diagnosed at approximate age of 58 years. Her disease course has been worsening. There are no hypoglycemic associated symptoms. Pertinent negatives for hypoglycemia include no confusion, headaches, pallor or seizures. Associated symptoms include fatigue, polydipsia and polyuria. Pertinent negatives for diabetes include no chest pain and no polyphagia. There are no hypoglycemic complications. Symptoms are worsening. There are no diabetic complications. Risk factors for coronary artery disease include diabetes mellitus, dyslipidemia, family history, obesity, tobacco exposure, sedentary lifestyle, post-menopausal (age 58) and hypertension. Current diabetic treatment includes insulin injections (She is currently on Basaglar 60 units nightly, Metformin 1000 mg p.o. twice daily, Januvia 100 mg p.o. daily.). Her weight is fluctuating  minimally. She is following a generally unhealthy diet. When asked about meal planning, she reported none. She has not had a previous visit with a dietitian. She rarely participates in exercise. Her overall blood glucose range is >200 mg/dl. (She did not bring any logs nor meter to review.  Her most recent A1c was 12.9%.  She verbally reports that her blood glucose readings above 200 mg per DL even  at fasting.) An ACE inhibitor/angiotensin II receptor blocker is being taken.  Hyperlipidemia This is a chronic problem. The current episode started more than 1 year ago. The problem is controlled. Exacerbating diseases include diabetes and obesity. Pertinent negatives include no chest pain, myalgias or shortness of breath. Current antihyperlipidemic treatment includes statins. Risk factors for coronary artery disease include diabetes mellitus, dyslipidemia, family history, hypertension, obesity, a sedentary lifestyle and post-menopausal.  Hypertension This is a chronic problem. The current episode started more than 1 year ago. Pertinent negatives include no chest pain, headaches, palpitations or shortness of breath. Risk factors for coronary artery disease include diabetes mellitus, dyslipidemia, obesity, post-menopausal state, smoking/tobacco exposure, sedentary lifestyle and family history. Past treatments include ACE inhibitors.     Review of Systems  Constitutional: Positive for fatigue. Negative for chills, fever and unexpected weight change.  HENT: Negative for trouble swallowing and voice change.   Eyes: Negative for visual disturbance.  Respiratory: Negative for cough, shortness of breath and wheezing.   Cardiovascular: Negative for chest pain, palpitations and leg swelling.  Gastrointestinal: Negative for diarrhea, nausea and vomiting.  Endocrine: Positive for polydipsia and polyuria. Negative for cold intolerance, heat intolerance and polyphagia.  Musculoskeletal: Negative for arthralgias and  myalgias.  Skin: Negative for color change, pallor, rash and wound.  Neurological: Negative for seizures and headaches.  Psychiatric/Behavioral: Negative for confusion and suicidal ideas.    Objective:    BP 130/79   Pulse 87   Ht 5\' 1"  (1.549 m)   Wt 217 lb (98.4 kg)   LMP 02/25/2019   BMI 41.00 kg/m   Wt Readings from Last 3 Encounters:  05/30/19 217 lb (98.4 kg)  03/30/19 233 lb 1.6 oz (105.7 kg)  03/21/19 228 lb 6.4 oz (103.6 kg)     Physical Exam Constitutional:      Appearance: She is well-developed.  HENT:     Head: Normocephalic and atraumatic.  Neck:     Thyroid: No thyromegaly.     Trachea: No tracheal deviation.  Cardiovascular:     Rate and Rhythm: Normal rate.  Pulmonary:     Effort: Pulmonary effort is normal.  Abdominal:     Tenderness: There is no abdominal tenderness. There is no guarding.  Musculoskeletal:        General: Normal range of motion.     Cervical back: Normal range of motion and neck supple.  Skin:    General: Skin is warm and dry.     Coloration: Skin is not pale.     Findings: No erythema or rash.  Neurological:     Mental Status: She is alert and oriented to person, place, and time.     Cranial Nerves: No cranial nerve deficit.     Coordination: Coordination normal.     Deep Tendon Reflexes: Reflexes are normal and symmetric.  Psychiatric:        Judgment: Judgment normal.      CMP ( most recent) CMP     Component Value Date/Time   NA 139 04/21/2019 0854   K 4.3 04/21/2019 0854   CL 103 04/21/2019 0854   CO2 23 04/21/2019 0854   GLUCOSE 295 (H) 04/21/2019 0854   BUN 15 04/21/2019 0854   CREATININE 0.68 04/21/2019 0854   CALCIUM 10.1 04/21/2019 0854   PROT 7.7 04/21/2019 0854   ALBUMIN 4.4 04/21/2019 0854   AST 27 04/21/2019 0854   ALT 26 04/21/2019 0854   ALKPHOS 67 04/21/2019 0854   BILITOT 0.6  04/21/2019 0854   GFRNONAA >60 04/21/2019 0854   GFRAA >60 04/21/2019 0854     Diabetic Labs (most recent): Lab  Results  Component Value Date   HGBA1C 12.9 (H) 04/21/2019     Lipid Panel ( most recent) Lipid Panel     Component Value Date/Time   CHOL 134 04/21/2019 0855   TRIG 139 04/21/2019 0855   HDL 38 (L) 04/21/2019 0855   CHOLHDL 3.5 04/21/2019 0855   VLDL 28 04/21/2019 0855   LDLCALC 68 04/21/2019 0855      Lab Results  Component Value Date   TSH 3.399 03/07/2013      Assessment & Plan:   1. Uncontrolled type 2 diabetes mellitus with hyperglycemia (HCC)  - Texie L Burnett has currently uncontrolled symptomatic type 2 DM since  58 years of age,  with most recent A1c of 12.9 %. Recent labs reviewed. - I had a long discussion with her about the progressive nature of diabetes and the pathology behind its complications. -her diabetes is complicated by obesity, history of smoking and she remains at a high risk for more acute and chronic complications which include CAD, CVA, CKD, retinopathy, and neuropathy. These are all discussed in detail with her.  - I have counseled her on diet  and weight management  by adopting a carbohydrate restricted/protein rich diet. Patient is encouraged to switch to  unprocessed or minimally processed     complex starch and increased protein intake (animal or plant source), fruits, and vegetables. -  she is advised to stick to a routine mealtimes to eat 3 meals  a day and avoid unnecessary snacks ( to snack only to correct hypoglycemia).   - she admits that there is a room for improvement in her food and drink choices. - Suggestion is made for her to avoid simple carbohydrates  from her diet including Cakes, Sweet Desserts, Ice Cream, Soda (diet and regular), Sweet Tea, Candies, Chips, Cookies, Store Bought Juices, Alcohol in Excess of  1-2 drinks a day, Artificial Sweeteners,  Coffee Creamer, and "Sugar-free" Products. This will help patient to have more stable blood glucose profile and potentially avoid unintended weight gain.  - she will be scheduled with  Jearld Fenton, RDN, CDE for diabetes education.  - I have approached her with the following individualized plan to manage  her diabetes and patient agrees:   - she may require intensive treatment with basal/bolus insulin in order for her to achieve control of diabetes to target. -However, she has room on her basal insulin before considering prandial insulin, advised to increase her Basaglar to 80 units nightly, and she is advised to start  strict monitoring of glucose 4 times a day-before meals and at bedtime. - she is warned not to take insulin without proper monitoring per orders.  - she is encouraged to call clinic for blood glucose levels less than 70 or above 300 mg /dl. - she is advised to continue Metformin 1000 mg p.o. twice daily, Januvia 100 mg p.o. daily, therapeutically suitable for patient . - Specific targets for  A1c;  LDL, HDL,  and Triglycerides were discussed with the patient.  2) Blood Pressure /Hypertension:  her blood pressure is  controlled to target.   she is advised to continue her current medications including lisinopril 40 mg p.o. daily with breakfast . 3) Lipids/Hyperlipidemia:   Review of her recent lipid panel showed  controlled  LDL at 68 .  she  is advised to continue  Crestor 20 mg daily at bedtime.  Side effects and precautions discussed with her.  4)  Weight/Diet:  Body mass index is 41 kg/m.  -   clearly complicating her diabetes care.   she is  a candidate for weight loss. I discussed with her the fact that loss of 5 - 10% of her  current body weight will have the most impact on her diabetes management.  Exercise, and detailed carbohydrates information provided  -  detailed on discharge instructions.  5) Chronic Care/Health Maintenance:  -she  is on ACEI/ARB and Statin medications and  is encouraged to initiate and continue to follow up with Ophthalmology, Dentist,  Podiatrist at least yearly or according to recommendations, and advised to  stay away from  smoking. I have recommended yearly flu vaccine and pneumonia vaccine at least every 5 years; moderate intensity exercise for up to 150 minutes weekly; and  sleep for at least 7 hours a day.  - she is  advised to maintain close follow up with Rosita Fire, MD for primary care needs, as well as her other providers for optimal and coordinated care.  - Time spent with the patient: 45 minutes, of which >50% was spent in obtaining information about her symptoms, reviewing her previous labs/studies, evaluations, and treatments, counseling her about her currently uncontrolled type 2 diabetes, hyperlipidemia, hypertension, and developing plans for long term treatment based on the latest standards of care/guidelines.  Please refer to " Patient Self Inventory" in the Media  tab for reviewed elements of pertinent patient history.  Kiaya L Ke participated in the discussions, expressed understanding, and voiced agreement with the above plans.  All questions were answered to her satisfaction. she is encouraged to contact clinic should she have any questions or concerns prior to her return visit.  Follow up plan: - Return in about 10 days (around 06/09/2019), or phone, for Follow up with Meter and Logs Only - no Labs.  Glade Lloyd, MD [facility] [address] [place], [place] [address] Phone: [phone]  Fax: [phone]    05/30/2019, 5:20 PM  This note was partially dictated with voice recognition software. Similar sounding words can be transcribed inadequately or may not  be corrected upon review.

## 2019-06-01 ENCOUNTER — Other Ambulatory Visit: Payer: Self-pay

## 2019-06-01 ENCOUNTER — Ambulatory Visit (HOSPITAL_COMMUNITY)
Admission: RE | Admit: 2019-06-01 | Discharge: 2019-06-01 | Disposition: A | Discharge status: Home / Self Care | Payer: BC Managed Care – PPO | Source: Ambulatory Visit | Attending: Adult Health | Admitting: Adult Health

## 2019-06-01 DIAGNOSIS — Z1231 Encounter for screening mammogram for malignant neoplasm of breast: Secondary | ICD-10-CM

## 2019-06-13 ENCOUNTER — Telehealth: Payer: Self-pay | Admitting: "Endocrinology

## 2019-06-13 MED ORDER — METFORMIN HCL 500 MG PO TABS: 1000.0000 mg | ORAL_TABLET | Freq: Two times a day (BID) | ORAL | 0 refills | Status: DC

## 2019-06-13 NOTE — Telephone Encounter (Signed)
Rx sent 

## 2019-06-13 NOTE — Telephone Encounter (Signed)
Pt needs refill on metFORMIN (GLUCOPHAGE) 500 MG tablet. She said she takes it 2x a da twice a day. Also, she needs one touch verio strips, testing 4x a day. [facility] [place]

## 2019-06-20 ENCOUNTER — Encounter: Payer: Self-pay | Admitting: "Endocrinology

## 2019-06-20 ENCOUNTER — Ambulatory Visit (INDEPENDENT_AMBULATORY_CARE_PROVIDER_SITE_OTHER): Payer: BC Managed Care – PPO | Admitting: "Endocrinology

## 2019-06-20 DIAGNOSIS — E1165 Type 2 diabetes mellitus with hyperglycemia: Secondary | ICD-10-CM

## 2019-06-20 DIAGNOSIS — E782 Mixed hyperlipidemia: Secondary | ICD-10-CM

## 2019-06-20 DIAGNOSIS — I10 Essential (primary) hypertension: Secondary | ICD-10-CM

## 2019-06-20 MED ORDER — ACCU-CHEK GUIDE VI STRP: ORAL_STRIP | 2 refills | Status: DC

## 2019-06-20 MED ORDER — ACCU-CHEK GUIDE W/DEVICE KIT: 1.0000 | PACK | 0 refills | Status: DC

## 2019-06-20 NOTE — Patient Instructions (Signed)

## 2019-06-20 NOTE — Progress Notes (Signed)
06/20/2019, 4:24 PM                                                     Endocrinology Telehealth Visit Follow up Note -During COVID -19 Pandemic  This visit type was conducted due to national recommendations for restrictions regarding the COVID-19 Pandemic  in an effort to limit this patient's exposure and mitigate transmission of the corona virus.  Due to her co-morbid illnesses, [name] is at  moderate to high risk for complications without adequate follow up.  This format is felt to be most appropriate for her at this time.  I connected with this patient on 06/20/2019   by telephone and verified that I am speaking with the correct person using two identifiers. [place], 1960/09/09. she has verbally consented to this visit. All issues noted in this document were discussed and addressed. The format was not optimal for physical exam.   Subjective:    Patient ID: [name], female    DOB: 05-16-1961.  [name] is being engaged in telehealth via telephone in follow-up after she was seen in consultation for management of currently uncontrolled symptomatic diabetes requested by  Rosita Fire, MD.   Past Medical History:  Diagnosis Date  . Allergy   . Asthma   . COPD (chronic obstructive pulmonary disease) ([place])   . Diabetes mellitus without complication ([place])   . Hypertension   . Morbid obesity due to excess calories ([place])   . Umbilical hernia     Past Surgical History:  Procedure Laterality Date  . CARPAL TUNNEL RELEASE Right   . CHOLECYSTECTOMY    . COLONOSCOPY N/A 01/12/2015   Procedure: COLONOSCOPY;  Surgeon: Danie Binder, MD;  Location: [facility];  Service: Endoscopy;  Laterality: N/A;  0830  . HYSTEROSCOPY WITH D & C N/A 04/01/2013   Procedure: DILATATION AND CURETTAGE /HYSTEROSCOPY;  Surgeon: Florian Buff, MD;  Location: [facility] ORS;  Service: Gynecology;  Laterality: N/A;  . NASAL  POLYP SURGERY      Social History   Socioeconomic History  . Marital status: Married    Spouse name: Not on file  . Number of children: 2  . Years of education: Not on file  . Highest education level: Not on file  Occupational History  . Not on file  Tobacco Use  . Smoking status: Former Smoker    Packs/day: 0.50    Years: 20.00    Pack years: 10.00    Types: Cigarettes    Quit date: 03/25/1997    Years since quitting: 22.2  . Smokeless tobacco: Never Used  Substance and Sexual Activity  . Alcohol use: Yes    Alcohol/week: 0.0 standard drinks    Comment: Ocassional  . Drug use: No  . Sexual activity: Yes    Birth control/protection: None, Post-menopausal  Other Topics Concern  . Not on file  Social History Narrative  . Not on file   Social Determinants of Health   Financial  Resource Strain:   . Difficulty of Paying Living Expenses: Not on file  Food Insecurity:   . Worried About Charity fundraiser in the Last Year: Not on file  . Ran Out of Food in the Last Year: Not on file  Transportation Needs:   . Lack of Transportation (Medical): Not on file  . Lack of Transportation (Non-Medical): Not on file  Physical Activity:   . Days of Exercise per Week: Not on file  . Minutes of Exercise per Session: Not on file  Stress:   . Feeling of Stress : Not on file  Social Connections:   . Frequency of Communication with Friends and Family: Not on file  . Frequency of Social Gatherings with Friends and Family: Not on file  . Attends Religious Services: Not on file  . Active Member of Clubs or Organizations: Not on file  . Attends Archivist Meetings: Not on file  . Marital Status: Not on file    Family History  Problem Relation Age of Onset  . Diabetes Mother   . Kidney disease Mother   . Other Mother        renal failure  . Hypertension Mother   . Cancer Mother        ?uterine cancer   . Diabetes Father   . Cancer Father        pancreatic  .  Diabetes Sister   . Kidney disease Sister   . Diabetes Maternal Grandmother   . Asthma Maternal Grandmother   . COPD Maternal Grandmother        bronchitis  . Hypertension Maternal Grandmother   . Thyroid disease Maternal Grandmother   . Hypertension Daughter   . Colon cancer Neg Hx     Outpatient Encounter Medications as of 06/20/2019  Medication Sig  . aspirin EC 81 MG tablet Take 81 mg by mouth daily.  . Blood Glucose Monitoring Suppl (ACCU-CHEK GUIDE) w/Device KIT 1 Piece by Does not apply route as directed.  . diclofenac sodium (VOLTAREN) 1 % GEL 4 (four) times daily as needed.  . fluticasone (FLONASE) 50 MCG/ACT nasal spray Place 2 sprays into both nostrils daily.  . Fluticasone-Salmeterol (ADVAIR) 250-50 MCG/DOSE AEPB Inhale 1 puff into the lungs 2 (two) times daily.   Marland Kitchen glucose blood (ACCU-CHEK GUIDE) test strip Use as instructed  . Insulin Glargine (BASAGLAR KWIKPEN) 100 UNIT/ML SOPN Inject 80 Units into the skin at bedtime.  Marland Kitchen ipratropium-albuterol (DUONEB) 0.5-2.5 (3) MG/3ML SOLN Take 3 mLs by nebulization every 4 (four) hours as needed (Shortness of Breath).   Marland Kitchen JANUVIA 100 MG tablet Take 100 mg by mouth daily.  Marland Kitchen lisinopril (PRINIVIL,ZESTRIL) 40 MG tablet Take 40 mg by mouth daily.  . medroxyPROGESTERone (PROVERA) 10 MG tablet Take 1 tablet (10 mg total) by mouth daily. One tablet daily x 2 weeks. Repeat as directed  . metFORMIN (GLUCOPHAGE) 500 MG tablet Take 2 tablets (1,000 mg total) by mouth 2 (two) times daily with a meal.  . Multiple Vitamin (MULTIVITAMIN WITH MINERALS) TABS tablet Take 1 tablet by mouth daily.  . rosuvastatin (CRESTOR) 20 MG tablet Take 20 mg by mouth daily.   . traMADol (ULTRAM) 50 MG tablet Take 50 mg by mouth 2 (two) times daily.   No facility-administered encounter medications on file as of 06/20/2019.    ALLERGIES: No Known Allergies  VACCINATION STATUS: Immunization History  Administered Date(s) Administered  . Influenza-Unspecified  06/09/2010, 06/09/2013    Diabetes She presents for  her follow-up diabetic visit. She has type 2 diabetes mellitus. Onset time: She was diagnosed at approximate age of 30 years. Her disease course has been improving. There are no hypoglycemic associated symptoms. Pertinent negatives for hypoglycemia include no confusion, headaches, pallor or seizures. Associated symptoms include fatigue. Pertinent negatives for diabetes include no chest pain, no polydipsia, no polyphagia and no polyuria. There are no hypoglycemic complications. Symptoms are improving. There are no diabetic complications. Risk factors for coronary artery disease include diabetes mellitus, dyslipidemia, family history, obesity, tobacco exposure, sedentary lifestyle, post-menopausal and hypertension. Current diabetic treatment includes insulin injections (She is currently on Basaglar 60 units nightly, Metformin 1000 mg p.o. twice daily, Januvia 100 mg p.o. daily.). Her weight is fluctuating minimally. She is following a generally unhealthy diet. When asked about meal planning, she reported none. She has not had a previous visit with a dietitian. She rarely participates in exercise. Her overall blood glucose range is >200 mg/dl. (She did not bring any logs nor meter to review.  Her most recent A1c was 12.9%.  She verbally reports that her blood glucose readings above 200 mg per DL even at fasting.) An ACE inhibitor/angiotensin II receptor blocker is being taken.  Hyperlipidemia This is a chronic problem. The current episode started more than 1 year ago. The problem is controlled. Exacerbating diseases include diabetes and obesity. Pertinent negatives include no chest pain, myalgias or shortness of breath. Current antihyperlipidemic treatment includes statins. Risk factors for coronary artery disease include diabetes mellitus, dyslipidemia, family history, hypertension, obesity, a sedentary lifestyle and post-menopausal.  Hypertension This is a  chronic problem. The current episode started more than 1 year ago. Pertinent negatives include no chest pain, headaches, palpitations or shortness of breath. Risk factors for coronary artery disease include diabetes mellitus, dyslipidemia, obesity, post-menopausal state, smoking/tobacco exposure, sedentary lifestyle and family history. Past treatments include ACE inhibitors.    Review of systems: Limited as above.  Objective:    LMP 02/25/2019   Wt Readings from Last 3 Encounters:  05/30/19 217 lb (98.4 kg)  03/30/19 233 lb 1.6 oz (105.7 kg)  03/21/19 228 lb 6.4 oz (103.6 kg)     CMP     Component Value Date/Time   NA 139 04/21/2019 0854   K 4.3 04/21/2019 0854   CL 103 04/21/2019 0854   CO2 23 04/21/2019 0854   GLUCOSE 295 (H) 04/21/2019 0854   BUN 15 04/21/2019 0854   CREATININE 0.68 04/21/2019 0854   CALCIUM 10.1 04/21/2019 0854   PROT 7.7 04/21/2019 0854   ALBUMIN 4.4 04/21/2019 0854   AST 27 04/21/2019 0854   ALT 26 04/21/2019 0854   ALKPHOS 67 04/21/2019 0854   BILITOT 0.6 04/21/2019 0854   GFRNONAA >60 04/21/2019 0854   GFRAA >60 04/21/2019 0854     Diabetic Labs (most recent): Lab Results  Component Value Date   HGBA1C 12.9 (H) 04/21/2019     Lipid Panel ( most recent) Lipid Panel     Component Value Date/Time   CHOL 134 04/21/2019 0855   TRIG 139 04/21/2019 0855   HDL 38 (L) 04/21/2019 0855   CHOLHDL 3.5 04/21/2019 0855   VLDL 28 04/21/2019 0855   LDLCALC 68 04/21/2019 0855      Lab Results  Component Value Date   TSH 3.399 03/07/2013     Assessment & Plan:   1. Uncontrolled type 2 diabetes mellitus with hyperglycemia ([place])  - [name] has currently uncontrolled symptomatic type 2 DM since 59  years of age. -Since her last visit, she documented near target glycemic profile between 100-138 at fasting, between 105 and 130 prelunch between 106 and 157 presupper and between 140 and 172 at bedtime.  This is despite her recent A1c of 12.9 %.  Recent labs reviewed. - I had a long discussion with her about the progressive nature of diabetes and the pathology behind its complications. -her diabetes is complicated by obesity, history of smoking and she remains at a high risk for more acute and chronic complications which include CAD, CVA, CKD, retinopathy, and neuropathy. These are all discussed in detail with her.  - I have counseled her on diet  and weight management  by adopting a carbohydrate restricted/protein rich diet. Patient is encouraged to switch to  unprocessed or minimally processed     complex starch and increased protein intake (animal or plant source), fruits, and vegetables. -  she is advised to stick to a routine mealtimes to eat 3 meals  a day and avoid unnecessary snacks ( to snack only to correct hypoglycemia).   - she  admits there is a room for improvement in her diet and drink choices. -  Suggestion is made for her to avoid simple carbohydrates  from her diet including Cakes, Sweet Desserts / Pastries, Ice Cream, Soda (diet and regular), Sweet Tea, Candies, Chips, Cookies, Sweet Pastries,  Store Bought Juices, Alcohol in Excess of  1-2 drinks a day, Artificial Sweeteners, Coffee Creamer, and "Sugar-free" Products. This will help patient to have stable blood glucose profile and potentially avoid unintended weight gain.   - she will be scheduled with Jearld Fenton, RDN, CDE for diabetes education.  - I have approached her with the following individualized plan to manage  her diabetes and patient agrees:   -Based on her reported glycemia which are all near target, she will not require prandial insulin for now.  -She made significant change in her diet, and this is helping her glycemic profile.  -She is advised to continue Basaglar 80 units nightly, and she is advised to continue to monitor blood glucose twice a day-daily before breakfast and at bedtime.    - she is warned not to take insulin without proper  monitoring per orders.  - she is encouraged to call clinic for blood glucose levels less than 70 or above 300 mg /dl. - she is advised to continue Metformin 1000 mg p.o. twice daily, Januvia 100 mg p.o. daily, therapeutically suitable for patient . - Specific targets for  A1c;  LDL, HDL,  and Triglycerides were discussed with the patient.  2) Blood Pressure /Hypertension:  she is advised to home monitor blood pressure and report if > 140/90 on 2 separate readings.   she is advised to continue her current medications including lisinopril 40 mg p.o. daily with breakfast . 3) Lipids/Hyperlipidemia:   Review of her recent lipid panel showed  controlled  LDL at 68 .  she  is advised to continue Crestor 20 mg daily at bedtime. Side effects and precautions discussed with her.  4)  Weight/Diet: Her BMI is 41--   clearly complicating her diabetes care.   she is  a candidate for weight loss. I discussed with her the fact that loss of 5 - 10% of her  current body weight will have the most impact on her diabetes management.  Exercise, and detailed carbohydrates information provided  -  detailed on discharge instructions.  5) Chronic Care/Health Maintenance:  -she  is on  ACEI/ARB and Statin medications and  is encouraged to initiate and continue to follow up with Ophthalmology, Dentist,  Podiatrist at least yearly or according to recommendations, and advised to  stay away from smoking. I have recommended yearly flu vaccine and pneumonia vaccine at least every 5 years; moderate intensity exercise for up to 150 minutes weekly; and  sleep for at least 7 hours a day.  - she is  advised to maintain close follow up with Rosita Fire, MD for primary care needs, as well as her other providers for optimal and coordinated care.  - Time spent on this patient care encounter:  35 min, of which >50% was spent in  counseling and the rest reviewing her  current and  previous labs/studies ( including abstraction from other  facilities),  previous treatments, her blood glucose readings, and medications' doses and developing a plan for long-term care based on the latest recommendations for standards of care; and documenting her care.  [name] participated in the discussions, expressed understanding, and voiced agreement with the above plans.  All questions were answered to her satisfaction. she is encouraged to contact clinic should she have any questions or concerns prior to her return visit.   Follow up plan: - Return in about 9 weeks (around 08/22/2019) for Include 8 log sheets, Bring Meter and Logs- A1c in Office.  Glade Lloyd, MD [facility] [address] [place], [place] [address] Phone: [phone]  Fax: [phone]    06/20/2019, 4:24 PM  This note was partially dictated with voice recognition software. Similar sounding words can be transcribed inadequately or may not  be corrected upon review.

## 2019-06-22 ENCOUNTER — Other Ambulatory Visit: Payer: Self-pay | Admitting: Obstetrics and Gynecology

## 2019-07-12 ENCOUNTER — Other Ambulatory Visit: Payer: Self-pay | Admitting: "Endocrinology

## 2019-07-19 DIAGNOSIS — I10 Essential (primary) hypertension: Secondary | ICD-10-CM | POA: Diagnosis not present

## 2019-07-19 DIAGNOSIS — L0292 Furuncle, unspecified: Secondary | ICD-10-CM | POA: Diagnosis not present

## 2019-07-19 DIAGNOSIS — E1142 Type 2 diabetes mellitus with diabetic polyneuropathy: Secondary | ICD-10-CM | POA: Diagnosis not present

## 2019-07-26 ENCOUNTER — Ambulatory Visit: Payer: BC Managed Care – PPO | Admitting: Nutrition

## 2019-08-22 ENCOUNTER — Encounter: Payer: Self-pay | Admitting: "Endocrinology

## 2019-08-22 ENCOUNTER — Other Ambulatory Visit: Payer: Self-pay | Admitting: "Endocrinology

## 2019-08-22 ENCOUNTER — Other Ambulatory Visit: Payer: Self-pay

## 2019-08-22 ENCOUNTER — Ambulatory Visit: Payer: BC Managed Care – PPO | Admitting: "Endocrinology

## 2019-08-22 VITALS — BP 142/82 | HR 86 | Ht 61.0 in | Wt 228.0 lb

## 2019-08-22 DIAGNOSIS — E782 Mixed hyperlipidemia: Secondary | ICD-10-CM

## 2019-08-22 DIAGNOSIS — I10 Essential (primary) hypertension: Secondary | ICD-10-CM

## 2019-08-22 DIAGNOSIS — E1165 Type 2 diabetes mellitus with hyperglycemia: Secondary | ICD-10-CM | POA: Diagnosis not present

## 2019-08-22 LAB — POCT GLYCOSYLATED HEMOGLOBIN (HGB A1C): Hemoglobin A1C: 10.3 % — AB (ref 4.0–5.6)

## 2019-08-22 MED ORDER — JANUVIA 100 MG PO TABS: 100.0000 mg | ORAL_TABLET | Freq: Every day | ORAL | 3 refills | Status: DC

## 2019-08-22 MED ORDER — METFORMIN HCL 500 MG PO TABS: 500.0000 mg | ORAL_TABLET | Freq: Two times a day (BID) | ORAL | 2 refills | Status: DC

## 2019-08-22 NOTE — Progress Notes (Signed)
08/22/2019, 5:10 PM   Endocrinology follow-up note   Subjective:    Patient ID: [name], female    DOB: 06-14-1960.  [name] is being seen in follow-up in the management of her currently uncontrolled type 2 diabetes, hypertension. FTN:[id number], Brandon Melnick, MD.   Past Medical History:  Diagnosis Date  . Allergy   . Asthma   . COPD (chronic obstructive pulmonary disease) ([place])   . Diabetes mellitus without complication ([place])   . Hypertension   . Morbid obesity due to excess calories ([place])   . Umbilical hernia     Past Surgical History:  Procedure Laterality Date  . CARPAL TUNNEL RELEASE Right   . CHOLECYSTECTOMY    . COLONOSCOPY N/A 01/12/2015   Procedure: COLONOSCOPY;  Surgeon: Danie Binder, MD;  Location: [facility];  Service: Endoscopy;  Laterality: N/A;  0830  . HYSTEROSCOPY WITH D & C N/A 04/01/2013   Procedure: DILATATION AND CURETTAGE /HYSTEROSCOPY;  Surgeon: Florian Buff, MD;  Location: [facility];  Service: Gynecology;  Laterality: N/A;  . NASAL POLYP SURGERY      Social History   Socioeconomic History  . Marital status: Married    Spouse name: Not on file  . Number of children: 2  . Years of education: Not on file  . Highest education level: Not on file  Occupational History  . Not on file  Tobacco Use  . Smoking status: Former Smoker    Packs/day: 0.50    Years: 20.00    Pack years: 10.00    Types: Cigarettes    Quit date: 03/25/1997    Years since quitting: 22.4  . Smokeless tobacco: Never Used  Substance and Sexual Activity  . Alcohol use: Yes    Alcohol/week: 0.0 standard drinks    Comment: Ocassional  . Drug use: No  . Sexual activity: Yes    Birth control/protection: None, Post-menopausal  Other Topics Concern  . Not on file  Social History Narrative  . Not on file   Social Determinants of Health   Financial Resource Strain:   . Difficulty of Paying  Living Expenses:   Food Insecurity:   . Worried About Charity fundraiser in the Last Year:   . Arboriculturist in the Last Year:   Transportation Needs:   . Film/video editor (Medical):   Marland Kitchen Lack of Transportation (Non-Medical):   Physical Activity:   . Days of Exercise per Week:   . Minutes of Exercise per Session:   Stress:   . Feeling of Stress :   Social Connections:   . Frequency of Communication with Friends and Family:   . Frequency of Social Gatherings with Friends and Family:   . Attends Religious Services:   . Active Member of Clubs or Organizations:   . Attends Archivist Meetings:   Marland Kitchen Marital Status:     Family History  Problem Relation Age of Onset  . Diabetes Mother   . Kidney disease Mother   . Other Mother        renal failure  . Hypertension Mother   . Cancer Mother        ?  uterine cancer   . Diabetes Father   . Cancer Father        pancreatic  . Diabetes Sister   . Kidney disease Sister   . Diabetes Maternal Grandmother   . Asthma Maternal Grandmother   . COPD Maternal Grandmother        bronchitis  . Hypertension Maternal Grandmother   . Thyroid disease Maternal Grandmother   . Hypertension Daughter   . Colon cancer Neg Hx     Outpatient Encounter Medications as of 08/22/2019  Medication Sig  . aspirin EC 81 MG tablet Take 81 mg by mouth daily.  . Blood Glucose Monitoring Suppl (ACCU-CHEK GUIDE) w/Device KIT 1 Piece by Does not apply route as directed.  . diclofenac sodium (VOLTAREN) 1 % GEL 4 (four) times daily as needed.  . fluticasone (FLONASE) 50 MCG/ACT nasal spray Place 2 sprays into both nostrils daily.  . Fluticasone-Salmeterol (ADVAIR) 250-50 MCG/DOSE AEPB Inhale 1 puff into the lungs 2 (two) times daily.   Marland Kitchen glucose blood (ACCU-CHEK GUIDE) test strip Use as instructed  . Insulin Glargine (BASAGLAR KWIKPEN) 100 UNIT/ML SOPN Inject 80 Units into the skin at bedtime.  Marland Kitchen ipratropium-albuterol (DUONEB) 0.5-2.5 (3) MG/3ML SOLN  Take 3 mLs by nebulization every 4 (four) hours as needed (Shortness of Breath).   Marland Kitchen JANUVIA 100 MG tablet Take 1 tablet (100 mg total) by mouth daily.  Marland Kitchen lisinopril (PRINIVIL,ZESTRIL) 40 MG tablet Take 40 mg by mouth daily.  . medroxyPROGESTERone (PROVERA) 10 MG tablet TAKE ONE TABLET DAILY FOR 2 WEEKS-REPEAT AS DIRECTED  . metFORMIN (GLUCOPHAGE) 500 MG tablet Take 1 tablet (500 mg total) by mouth 2 (two) times daily with a meal.  . Multiple Vitamin (MULTIVITAMIN WITH MINERALS) TABS tablet Take 1 tablet by mouth daily.  . rosuvastatin (CRESTOR) 20 MG tablet Take 20 mg by mouth daily.   . traMADol (ULTRAM) 50 MG tablet Take 50 mg by mouth 2 (two) times daily.  . [DISCONTINUED] JANUVIA 100 MG tablet Take 100 mg by mouth daily.  . [DISCONTINUED] metFORMIN (GLUCOPHAGE) 500 MG tablet TAKE 2 TABLETS (1,000 MG TOTAL) BY MOUTH 2 (TWO) TIMES DAILY WITH A MEAL.   No facility-administered encounter medications on file as of 08/22/2019.    ALLERGIES: No Known Allergies  VACCINATION STATUS: Immunization History  Administered Date(s) Administered  . Influenza-Unspecified 06/09/2010, 06/09/2013    Diabetes She presents for her follow-up diabetic visit. She has type 2 diabetes mellitus. Onset time: She was diagnosed at approximate age of 59 years. Her disease course has been improving. There are no hypoglycemic associated symptoms. Pertinent negatives for hypoglycemia include no confusion, headaches, pallor or seizures. Associated symptoms include polydipsia and polyuria. Pertinent negatives for diabetes include no chest pain, no fatigue and no polyphagia. There are no hypoglycemic complications. Symptoms are improving. There are no diabetic complications. Risk factors for coronary artery disease include diabetes mellitus, dyslipidemia, family history, obesity, tobacco exposure, sedentary lifestyle, post-menopausal and hypertension. Current diabetic treatment includes insulin injections (She is currently on  Basaglar 60 units nightly, Metformin 1000 mg p.o. twice daily, Januvia 100 mg p.o. daily.). Her weight is fluctuating minimally. She is following a generally unhealthy diet. When asked about meal planning, she reported none. She has not had a previous visit with a dietitian. She rarely participates in exercise. Her overall blood glucose range is >200 mg/dl. (Unfortunately, she did not bring any logs nor meter to review.  Her point-of-care A1c is 10.3%, improving from 12.9%.   -  She  verbally reports that her blood glucose readings above 200 mg per DL even at fasting.) An ACE inhibitor/angiotensin II receptor blocker is being taken.  Hyperlipidemia This is a chronic problem. The current episode started more than 1 year ago. The problem is controlled. Exacerbating diseases include diabetes and obesity. Pertinent negatives include no chest pain, myalgias or shortness of breath. Current antihyperlipidemic treatment includes statins. Risk factors for coronary artery disease include diabetes mellitus, dyslipidemia, family history, hypertension, obesity, a sedentary lifestyle and post-menopausal.  Hypertension This is a chronic problem. The current episode started more than 1 year ago. Pertinent negatives include no chest pain, headaches, palpitations or shortness of breath. Risk factors for coronary artery disease include diabetes mellitus, dyslipidemia, obesity, post-menopausal state, smoking/tobacco exposure, sedentary lifestyle and family history. Past treatments include ACE inhibitors.    Review of systems  Constitutional: + Minimally fluctuating body weight,  current  Body mass index is 43.08 kg/m. , no fatigue, no subjective hyperthermia, no subjective hypothermia Eyes: no blurry vision, no xerophthalmia ENT: no sore throat, no nodules palpated in throat, no dysphagia/odynophagia, no hoarseness Cardiovascular: no Chest Pain, no Shortness of Breath, no palpitations, no leg swelling Respiratory: no  cough, no shortness of breath Gastrointestinal: no Nausea/Vomiting/Diarhhea Musculoskeletal: no muscle/joint aches Skin: no rashes, no hyperemia Neurological: no tremors, no numbness, no tingling, no dizziness Psychiatric: no depression, no anxiety   Objective:    BP (!) 142/82   Pulse 86   Ht '5\' 1"'$  (1.549 m)   Wt 228 lb (103.4 kg)   LMP 02/25/2019   BMI 43.08 kg/m   Wt Readings from Last 3 Encounters:  08/22/19 228 lb (103.4 kg)  05/30/19 217 lb (98.4 kg)  03/30/19 233 lb 1.6 oz (105.7 kg)    Physical Exam- Limited  Constitutional:  Body mass index is 43.08 kg/m. , not in acute distress, normal state of mind Eyes:  EOMI, no exophthalmos Neck: Supple Thyroid: No gross goiter Respiratory: Adequate breathing efforts Musculoskeletal: no gross deformities, strength intact in all four extremities, no gross restriction of joint movements Skin:  no rashes, no hyperemia Neurological: no tremor with outstretched hands,    CMP     Component Value Date/Time   NA 139 04/21/2019 0854   K 4.3 04/21/2019 0854   CL 103 04/21/2019 0854   CO2 23 04/21/2019 0854   GLUCOSE 295 (H) 04/21/2019 0854   BUN 15 04/21/2019 0854   CREATININE 0.68 04/21/2019 0854   CALCIUM 10.1 04/21/2019 0854   PROT 7.7 04/21/2019 0854   ALBUMIN 4.4 04/21/2019 0854   AST 27 04/21/2019 0854   ALT 26 04/21/2019 0854   ALKPHOS 67 04/21/2019 0854   BILITOT 0.6 04/21/2019 0854   GFRNONAA >60 04/21/2019 0854   GFRAA >60 04/21/2019 0854     Diabetic Labs (most recent): Lab Results  Component Value Date   HGBA1C 10.3 (A) 08/22/2019   HGBA1C 12.9 (H) 04/21/2019     Lipid Panel ( most recent) Lipid Panel     Component Value Date/Time   CHOL 134 04/21/2019 0855   TRIG 139 04/21/2019 0855   HDL 38 (L) 04/21/2019 0855   CHOLHDL 3.5 04/21/2019 0855   VLDL 28 04/21/2019 0855   LDLCALC 68 04/21/2019 0855      Lab Results  Component Value Date   TSH 3.399 03/07/2013     Assessment & Plan:   1.  Uncontrolled type 2 diabetes mellitus with hyperglycemia ([place])  - Anayely L Caster has currently uncontrolled symptomatic type  2 DM since 59 years of age.   Unfortunately, she did not bring any logs nor meter to review.  Her point-of-care A1c is 10.3%, improving from 12.9%.   -  She verbally reports that her blood glucose readings above 200 mg per DL even at fasting.   - I had a long discussion with her about the progressive nature of diabetes and the pathology behind its complications. -her diabetes is complicated by obesity, non- engagement for management plan,  history of smoking and she remains at a high risk for more acute and chronic complications which include CAD, CVA, CKD, retinopathy, and neuropathy. These are all discussed in detail with her.  - I have counseled her on diet  and weight management  by adopting a carbohydrate restricted/protein rich diet. Patient is encouraged to switch to  unprocessed or minimally processed     complex starch and increased protein intake (animal or plant source), fruits, and vegetables. -  she is advised to stick to a routine mealtimes to eat 3 meals  a day and avoid unnecessary snacks ( to snack only to correct hypoglycemia).   - she  admits there is a room for improvement in her diet and drink choices. -  Suggestion is made for her to avoid simple carbohydrates  from her diet including Cakes, Sweet Desserts / Pastries, Ice Cream, Soda (diet and regular), Sweet Tea, Candies, Chips, Cookies, Sweet Pastries,  Store Bought Juices, Alcohol in Excess of  1-2 drinks a day, Artificial Sweeteners, Coffee Creamer, and "Sugar-free" Products. This will help patient to have stable blood glucose profile and potentially avoid unintended weight gain.  - she will be scheduled with Jearld Fenton, RDN, CDE for diabetes education.  - I have approached her with the following individualized plan to manage  her diabetes and patient agrees:    -She did not present with any  logs nor meter, and is not reliable for intensification of insulin treatment  With out her engagement for proper monitoring of glucose for safe use of insulin.  -In the meantime, she is advised to continue Basaglar 80 units nightly, and she is advised to continue to monitor blood glucose twice a day-daily before breakfast and at bedtime.    - she is warned not to take insulin without proper monitoring per orders.  - she is encouraged to call clinic for blood glucose levels less than 70 or above 300 mg /dl. - she is advised to continue Metformin 1000 mg p.o. twice daily, Januvia 100 mg p.o. daily, therapeutically suitable for patient .  - Specific targets for  A1c;  LDL, HDL,  and Triglycerides were discussed with the patient.  2) Blood Pressure /Hypertension:   Her blood pressure is not controlled to target.   she is advised to continue her current medications including lisinopril 40 mg p.o. daily with breakfast .  3) Lipids/Hyperlipidemia:   Review of her recent lipid panel showed  controlled  LDL at 68 .  she  is advised to continue Crestor 20 mg p.o. daily at bedtime.   Side effects and precautions discussed with her.  4)  Weight/Diet: Her BMI is 41--   clearly complicating her diabetes care.   she is  a candidate for modest weight loss. I discussed with her the fact that loss of 5 - 10% of her  current body weight will have the most impact on her diabetes management.  Exercise, and detailed carbohydrates information provided  -  detailed on discharge instructions.  5) Chronic Care/Health Maintenance:  -she  is on ACEI/ARB and Statin medications and  is encouraged to initiate and continue to follow up with Ophthalmology, Dentist,  Podiatrist at least yearly or according to recommendations, and advised to  stay away from smoking. I have recommended yearly flu vaccine and pneumonia vaccine at least every 5 years; moderate intensity exercise for up to 150 minutes weekly; and  sleep for at least 7  hours a day.  - she is  advised to maintain close follow up with Rosita Fire, MD for primary care needs, as well as her other providers for optimal and coordinated care.  - Time spent on this patient care encounter:  35 min, of which > 50% was spent in  counseling and the rest reviewing her blood glucose logs , discussing her hypoglycemia and hyperglycemia episodes, reviewing her current and  previous labs / studies  ( including abstraction from other facilities) and medications  doses and developing a  long term treatment plan and documenting her care.   Please refer to Patient Instructions for Blood Glucose Monitoring and Insulin/Medications Dosing Guide"  in media tab for additional information. Please  also refer to " Patient Self Inventory" in the Media  tab for reviewed elements of pertinent patient history.  Chantille L Siefken participated in the discussions, expressed understanding, and voiced agreement with the above plans.  All questions were answered to her satisfaction. she is encouraged to contact clinic should she have any questions or concerns prior to her return visit.    Follow up plan: - Return in about 3 months (around 11/22/2019) for Bring Meter and Logs- A1c in Office, Follow up with Pre-visit Labs.  Glade Lloyd, MD [facility] [address] [place], [place] [address] Phone: [phone]  Fax: [phone]    08/22/2019, 5:10 PM  This note was partially dictated with voice recognition software. Similar sounding words can be transcribed inadequately or may not  be corrected upon review.

## 2019-08-22 NOTE — Patient Instructions (Signed)

## 2019-09-05 ENCOUNTER — Ambulatory Visit: Payer: BC Managed Care – PPO | Admitting: Nutrition

## 2019-09-06 DIAGNOSIS — L089 Local infection of the skin and subcutaneous tissue, unspecified: Secondary | ICD-10-CM | POA: Diagnosis not present

## 2019-09-06 DIAGNOSIS — E1142 Type 2 diabetes mellitus with diabetic polyneuropathy: Secondary | ICD-10-CM | POA: Diagnosis not present

## 2019-09-06 DIAGNOSIS — I10 Essential (primary) hypertension: Secondary | ICD-10-CM | POA: Diagnosis not present

## 2019-10-18 ENCOUNTER — Ambulatory Visit: Payer: BC Managed Care – PPO | Admitting: Nutrition

## 2019-10-25 DIAGNOSIS — I10 Essential (primary) hypertension: Secondary | ICD-10-CM | POA: Diagnosis not present

## 2019-10-25 DIAGNOSIS — E1142 Type 2 diabetes mellitus with diabetic polyneuropathy: Secondary | ICD-10-CM | POA: Diagnosis not present

## 2019-10-25 DIAGNOSIS — J4530 Mild persistent asthma, uncomplicated: Secondary | ICD-10-CM | POA: Diagnosis not present

## 2019-11-08 ENCOUNTER — Ambulatory Visit: Payer: BC Managed Care – PPO | Admitting: Nutrition

## 2019-11-24 ENCOUNTER — Ambulatory Visit: Payer: BC Managed Care – PPO | Admitting: "Endocrinology

## 2019-12-06 DIAGNOSIS — J309 Allergic rhinitis, unspecified: Secondary | ICD-10-CM | POA: Diagnosis not present

## 2019-12-06 DIAGNOSIS — J029 Acute pharyngitis, unspecified: Secondary | ICD-10-CM | POA: Diagnosis not present

## 2019-12-06 DIAGNOSIS — E1142 Type 2 diabetes mellitus with diabetic polyneuropathy: Secondary | ICD-10-CM | POA: Diagnosis not present

## 2019-12-06 DIAGNOSIS — J4530 Mild persistent asthma, uncomplicated: Secondary | ICD-10-CM | POA: Diagnosis not present

## 2019-12-07 ENCOUNTER — Other Ambulatory Visit: Payer: Self-pay | Admitting: "Endocrinology

## 2019-12-27 DIAGNOSIS — J4530 Mild persistent asthma, uncomplicated: Secondary | ICD-10-CM | POA: Diagnosis not present

## 2019-12-27 DIAGNOSIS — E1142 Type 2 diabetes mellitus with diabetic polyneuropathy: Secondary | ICD-10-CM | POA: Diagnosis not present

## 2019-12-27 DIAGNOSIS — Z0001 Encounter for general adult medical examination with abnormal findings: Secondary | ICD-10-CM | POA: Diagnosis not present

## 2019-12-27 DIAGNOSIS — I10 Essential (primary) hypertension: Secondary | ICD-10-CM | POA: Diagnosis not present

## 2019-12-28 ENCOUNTER — Ambulatory Visit: Payer: BC Managed Care – PPO | Admitting: "Endocrinology

## 2020-01-12 ENCOUNTER — Other Ambulatory Visit: Payer: Self-pay | Admitting: "Endocrinology

## 2020-01-16 ENCOUNTER — Other Ambulatory Visit: Payer: Self-pay | Admitting: "Endocrinology

## 2020-01-16 DIAGNOSIS — E1142 Type 2 diabetes mellitus with diabetic polyneuropathy: Secondary | ICD-10-CM | POA: Diagnosis not present

## 2020-01-16 DIAGNOSIS — E1165 Type 2 diabetes mellitus with hyperglycemia: Secondary | ICD-10-CM | POA: Diagnosis not present

## 2020-01-16 DIAGNOSIS — I10 Essential (primary) hypertension: Secondary | ICD-10-CM | POA: Diagnosis not present

## 2020-01-17 LAB — TSH: TSH: 2.11 u[IU]/mL (ref 0.450–4.500)

## 2020-01-17 LAB — T4, FREE: Free T4: 1.06 ng/dL (ref 0.82–1.77)

## 2020-02-12 ENCOUNTER — Other Ambulatory Visit: Payer: Self-pay | Admitting: "Endocrinology

## 2020-03-12 ENCOUNTER — Ambulatory Visit (INDEPENDENT_AMBULATORY_CARE_PROVIDER_SITE_OTHER): Payer: BC Managed Care – PPO | Admitting: "Endocrinology

## 2020-03-12 ENCOUNTER — Other Ambulatory Visit: Payer: Self-pay

## 2020-03-12 ENCOUNTER — Encounter: Payer: Self-pay | Admitting: "Endocrinology

## 2020-03-12 VITALS — BP 143/81 | HR 78 | Ht 61.0 in | Wt 228.0 lb

## 2020-03-12 DIAGNOSIS — Z9119 Patient's noncompliance with other medical treatment and regimen: Secondary | ICD-10-CM

## 2020-03-12 DIAGNOSIS — E782 Mixed hyperlipidemia: Secondary | ICD-10-CM | POA: Diagnosis not present

## 2020-03-12 DIAGNOSIS — Z91199 Patient's noncompliance with other medical treatment and regimen due to unspecified reason: Secondary | ICD-10-CM | POA: Insufficient documentation

## 2020-03-12 DIAGNOSIS — E1165 Type 2 diabetes mellitus with hyperglycemia: Secondary | ICD-10-CM | POA: Diagnosis not present

## 2020-03-12 DIAGNOSIS — I10 Essential (primary) hypertension: Secondary | ICD-10-CM

## 2020-03-12 LAB — POCT GLYCOSYLATED HEMOGLOBIN (HGB A1C): Hemoglobin A1C: 13.2 % — AB (ref 4.0–5.6)

## 2020-03-12 NOTE — Patient Instructions (Signed)

## 2020-03-12 NOTE — Progress Notes (Signed)
03/12/2020, 3:58 PM   Endocrinology follow-up note   Subjective:    Patient ID: [name], female    DOB: Jun 16, 1960.  [name] is being seen in follow-up in the management of her currently uncontrolled type 2 diabetes, hypertension. [id number]:[id number], Brandon Melnick, MD.   Past Medical History:  Diagnosis Date  . Allergy   . Asthma   . COPD (chronic obstructive pulmonary disease) ([place])   . Diabetes mellitus without complication ([place])   . Hypertension   . Morbid obesity due to excess calories ([place])   . Umbilical hernia     Past Surgical History:  Procedure Laterality Date  . CARPAL TUNNEL RELEASE Right   . CHOLECYSTECTOMY    . COLONOSCOPY N/A 01/12/2015   Procedure: COLONOSCOPY;  Surgeon: Danie Binder, MD;  Location: [facility];  Service: Endoscopy;  Laterality: N/A;  0830  . HYSTEROSCOPY WITH D & C N/A 04/01/2013   Procedure: DILATATION AND CURETTAGE /HYSTEROSCOPY;  Surgeon: Florian Buff, MD;  Location: [facility] ORS;  Service: Gynecology;  Laterality: N/A;  . NASAL POLYP SURGERY      Social History   Socioeconomic History  . Marital status: Married    Spouse name: Not on file  . Number of children: 2  . Years of education: Not on file  . Highest education level: Not on file  Occupational History  . Not on file  Tobacco Use  . Smoking status: Former Smoker    Packs/day: 0.50    Years: 20.00    Pack years: 10.00    Types: Cigarettes    Quit date: 03/25/1997    Years since quitting: 22.9  . Smokeless tobacco: Never Used  Vaping Use  . Vaping Use: Never used  Substance and Sexual Activity  . Alcohol use: Yes    Alcohol/week: 0.0 standard drinks    Comment: Ocassional  . Drug use: No  . Sexual activity: Yes    Birth control/protection: None, Post-menopausal  Other Topics Concern  . Not on file  Social History Narrative  . Not on file   Social Determinants of Health   Financial  Resource Strain:   . Difficulty of Paying Living Expenses: Not on file  Food Insecurity:   . Worried About Charity fundraiser in the Last Year: Not on file  . Ran Out of Food in the Last Year: Not on file  Transportation Needs:   . Lack of Transportation (Medical): Not on file  . Lack of Transportation (Non-Medical): Not on file  Physical Activity:   . Days of Exercise per Week: Not on file  . Minutes of Exercise per Session: Not on file  Stress:   . Feeling of Stress : Not on file  Social Connections:   . Frequency of Communication with Friends and Family: Not on file  . Frequency of Social Gatherings with Friends and Family: Not on file  . Attends Religious Services: Not on file  . Active Member of Clubs or Organizations: Not on file  . Attends Archivist Meetings: Not on file  . Marital Status: Not on file    Family History  Problem Relation Age of Onset  .  Diabetes Mother   . Kidney disease Mother   . Other Mother        renal failure  . Hypertension Mother   . Cancer Mother        ?uterine cancer   . Diabetes Father   . Cancer Father        pancreatic  . Diabetes Sister   . Kidney disease Sister   . Diabetes Maternal Grandmother   . Asthma Maternal Grandmother   . COPD Maternal Grandmother        bronchitis  . Hypertension Maternal Grandmother   . Thyroid disease Maternal Grandmother   . Hypertension Daughter   . Colon cancer Neg Hx     Outpatient Encounter Medications as of 03/12/2020  Medication Sig  . aspirin EC 81 MG tablet Take 81 mg by mouth daily.  . B-D UF III MINI PEN NEEDLES 31G X 5 MM MISC USE ONCE DAILY  . diclofenac sodium (VOLTAREN) 1 % GEL 4 (four) times daily as needed.  . fluticasone (FLONASE) 50 MCG/ACT nasal spray Place 2 sprays into both nostrils daily.  . Fluticasone-Salmeterol (ADVAIR) 250-50 MCG/DOSE AEPB Inhale 1 puff into the lungs 2 (two) times daily.   Marland Kitchen glucose blood (ACCU-CHEK GUIDE) test strip Use as instructed  .  Insulin Glargine (BASAGLAR KWIKPEN) 100 UNIT/ML SOPN Inject 80 Units into the skin at bedtime.  Marland Kitchen ipratropium-albuterol (DUONEB) 0.5-2.5 (3) MG/3ML SOLN Take 3 mLs by nebulization every 4 (four) hours as needed (Shortness of Breath).   Marland Kitchen JANUVIA 100 MG tablet TAKE 1 TABLET BY MOUTH EVERY DAY  . lisinopril (PRINIVIL,ZESTRIL) 40 MG tablet Take 40 mg by mouth daily.  . medroxyPROGESTERone (PROVERA) 10 MG tablet TAKE ONE TABLET DAILY FOR 2 WEEKS-REPEAT AS DIRECTED  . metFORMIN (GLUCOPHAGE) 500 MG tablet Take 1 tablet (500 mg total) by mouth 2 (two) times daily with a meal.  . Multiple Vitamin (MULTIVITAMIN WITH MINERALS) TABS tablet Take 1 tablet by mouth daily.  . rosuvastatin (CRESTOR) 20 MG tablet Take 20 mg by mouth daily.   . traMADol (ULTRAM) 50 MG tablet Take 50 mg by mouth 2 (two) times daily.  . [DISCONTINUED] Blood Glucose Monitoring Suppl (ACCU-CHEK GUIDE ME) w/Device KIT 1 each by Other route See admin instructions. Use as directed to check blood glucose two time daily   No facility-administered encounter medications on file as of 03/12/2020.    ALLERGIES: No Known Allergies  VACCINATION STATUS: Immunization History  Administered Date(s) Administered  . Influenza-Unspecified 06/09/2010, 06/09/2013    Diabetes She presents for her follow-up diabetic visit. She has type 2 diabetes mellitus. Onset time: She was diagnosed at approximate age of 4 years. Her disease course has been improving. There are no hypoglycemic associated symptoms. Pertinent negatives for hypoglycemia include no confusion, headaches, pallor or seizures. Associated symptoms include polydipsia and polyuria. Pertinent negatives for diabetes include no chest pain, no fatigue and no polyphagia. There are no hypoglycemic complications. Symptoms are improving. There are no diabetic complications. Risk factors for coronary artery disease include diabetes mellitus, dyslipidemia, family history, obesity, tobacco exposure,  sedentary lifestyle, post-menopausal and hypertension. Current diabetic treatment includes insulin injections (She is currently on Basaglar 60 units nightly, Metformin 1000 mg p.o. twice daily, Januvia 100 mg p.o. daily.). Her weight is fluctuating minimally. She is following a generally unhealthy diet. When asked about meal planning, she reported none. She has not had a previous visit with a dietitian. She rarely participates in exercise. (Unfortunately, patient once again returns with  no meter nor logs.  Her A1c is 13.2% at point-of-care, increasing from 10.3%.  She denies hypoglycemia.) An ACE inhibitor/angiotensin II receptor blocker is being taken.  Hyperlipidemia This is a chronic problem. The current episode started more than 1 year ago. The problem is controlled. Exacerbating diseases include diabetes and obesity. Pertinent negatives include no chest pain, myalgias or shortness of breath. Current antihyperlipidemic treatment includes statins. Risk factors for coronary artery disease include diabetes mellitus, dyslipidemia, family history, hypertension, obesity, a sedentary lifestyle and post-menopausal.  Hypertension This is a chronic problem. The current episode started more than 1 year ago. Pertinent negatives include no chest pain, headaches, palpitations or shortness of breath. Risk factors for coronary artery disease include diabetes mellitus, dyslipidemia, obesity, post-menopausal state, smoking/tobacco exposure, sedentary lifestyle and family history. Past treatments include ACE inhibitors.    Review of systems  Constitutional: + Minimally fluctuating body weight,  current  Body mass index is 43.08 kg/m. , no fatigue, no subjective hyperthermia, no subjective hypothermia Eyes: no blurry vision, no xerophthalmia ENT: no sore throat, no nodules palpated in throat, no dysphagia/odynophagia, no hoarseness Cardiovascular: no Chest Pain, no Shortness of Breath, no palpitations, no leg  swelling Respiratory: no cough, no shortness of breath Gastrointestinal: no Nausea/Vomiting/Diarhhea Musculoskeletal: no muscle/joint aches Skin: no rashes, no hyperemia Neurological: no tremors, no numbness, no tingling, no dizziness Psychiatric: no depression, no anxiety   Objective:    BP (!) 143/81   Pulse 78   Ht 5\' 1"  (1.549 m)   Wt 228 lb (103.4 kg)   LMP 02/25/2019   BMI 43.08 kg/m   Wt Readings from Last 3 Encounters:  03/12/20 228 lb (103.4 kg)  08/22/19 228 lb (103.4 kg)  05/30/19 217 lb (98.4 kg)    Physical Exam- Limited  Constitutional:  Body mass index is 43.08 kg/m. , not in acute distress, normal state of mind Eyes:  EOMI, no exophthalmos Neck: Supple Thyroid: No gross goiter Respiratory: Adequate breathing efforts Musculoskeletal: no gross deformities, strength intact in all four extremities, no gross restriction of joint movements Skin:  no rashes, no hyperemia Neurological: no tremor with outstretched hands,    CMP     Component Value Date/Time   NA 139 04/21/2019 0854   K 4.3 04/21/2019 0854   CL 103 04/21/2019 0854   CO2 23 04/21/2019 0854   GLUCOSE 295 (H) 04/21/2019 0854   BUN 15 04/21/2019 0854   CREATININE 0.68 04/21/2019 0854   CALCIUM 10.1 04/21/2019 0854   PROT 7.7 04/21/2019 0854   ALBUMIN 4.4 04/21/2019 0854   AST 27 04/21/2019 0854   ALT 26 04/21/2019 0854   ALKPHOS 67 04/21/2019 0854   BILITOT 0.6 04/21/2019 0854   GFRNONAA >60 04/21/2019 0854   GFRAA >60 04/21/2019 0854     Diabetic Labs (most recent): Lab Results  Component Value Date   HGBA1C 13.2 (A) 03/12/2020   HGBA1C 10.3 (A) 08/22/2019   HGBA1C 12.9 (H) 04/21/2019     Lipid Panel ( most recent) Lipid Panel     Component Value Date/Time   CHOL 134 04/21/2019 0855   TRIG 139 04/21/2019 0855   HDL 38 (L) 04/21/2019 0855   CHOLHDL 3.5 04/21/2019 0855   VLDL 28 04/21/2019 0855   LDLCALC 68 04/21/2019 0855      Lab Results  Component Value Date   TSH  2.110 01/16/2020   TSH 3.399 03/07/2013   FREET4 1.06 01/16/2020     Assessment & Plan:   1. Uncontrolled  type 2 diabetes mellitus with hyperglycemia (HCC)  - [name] has currently uncontrolled symptomatic type 2 DM since 59 years of age.  Unfortunately, patient once again returns with no meter nor logs.  Her A1c is 13.2% at point-of-care, increasing from 10.3%.  She denies hypoglycemia.  - I had a long discussion with her about the progressive nature of diabetes and the pathology behind its complications. -her diabetes is complicated by obesity, non- engagement for management plan,  history of smoking and she remains at a high risk for more acute and chronic complications which include CAD, CVA, CKD, retinopathy, and neuropathy. These are all discussed in detail with her.  - I have counseled her on diet  and weight management  by adopting a carbohydrate restricted/protein rich diet. Patient is encouraged to switch to  unprocessed or minimally processed     complex starch and increased protein intake (animal or plant source), fruits, and vegetables. -  she is advised to stick to a routine mealtimes to eat 3 meals  a day and avoid unnecessary snacks ( to snack only to correct hypoglycemia).   - she  admits there is a room for improvement in her diet and drink choices. -  Suggestion is made for her to avoid simple carbohydrates  from her diet including Cakes, Sweet Desserts / Pastries, Ice Cream, Soda (diet and regular), Sweet Tea, Candies, Chips, Cookies, Sweet Pastries,  Store Bought Juices, Alcohol in Excess of  1-2 drinks a day, Artificial Sweeteners, Coffee Creamer, and "Sugar-free" Products. This will help patient to have stable blood glucose profile and potentially avoid unintended weight gain.   - she will be scheduled with Norm Salt, RDN, CDE for diabetes education.  - I have approached her with the following individualized plan to manage  her diabetes and patient agrees:     -Patient remains exceedingly noncompliant.  She did not present with any logs nor meter, and is not reliable for intensification of insulin treatment without her commitment for proper monitoring of glucose for safe use of insulin.   -In the meantime, she is advised to resume and continue Basaglar 80 units nightly, and she is advised to continue to monitor blood glucose 4 times a day-before meals and at bedtime and return in 1 week with her meter and logs for evaluation.    - she is warned not to take insulin without proper monitoring per orders.  - she is encouraged to call clinic for blood glucose levels less than 70 or above 300 mg /dl. - she is advised to continue Metformin 500 mg p.o. twice daily, continue Januvia 100 mg p.o. daily, therapeutically suitable for patient .  - Specific targets for  A1c;  LDL, HDL,  and Triglycerides were discussed with the patient.  2) Blood Pressure /Hypertension:   Her blood pressure is not controlled to target.   she is advised to continue her current medications including lisinopril 40 mg p.o. daily with breakfast .  3) Lipids/Hyperlipidemia:   Review of her recent lipid panel showed  controlled  LDL at 68 .  she  is advised to continue Crestor 20 mg daily at bedtime.   Side effects and precautions discussed with her.  4)  Weight/Diet: Her BMI is 43.08 -   clearly complicating her diabetes care.   she is  a candidate for modest weight loss. I discussed with her the fact that loss of 5 - 10% of her  current body weight will have the most  impact on her diabetes management.  Exercise, and detailed carbohydrates information provided  -  detailed on discharge instructions.  5) Chronic Care/Health Maintenance:  -she  is on ACEI/ARB and Statin medications and  is encouraged to initiate and continue to follow up with Ophthalmology, Dentist,  Podiatrist at least yearly or according to recommendations, and advised to  stay away from smoking. I have recommended  yearly flu vaccine and pneumonia vaccine at least every 5 years; moderate intensity exercise for up to 150 minutes weekly; and  sleep for at least 7 hours a day.  - she is  advised to maintain close follow up with Rosita Fire, MD for primary care needs, as well as her other providers for optimal and coordinated care.  - Time spent on this patient care encounter:  35 min, of which > 50% was spent in  counseling and the rest reviewing her blood glucose logs , discussing her hypoglycemia and hyperglycemia episodes, reviewing her current and  previous labs / studies  ( including abstraction from other facilities) and medications  doses and developing a  long term treatment plan and documenting her care.   Please refer to Patient Instructions for Blood Glucose Monitoring and Insulin/Medications Dosing Guide"  in media tab for additional information. Please  also refer to " Patient Self Inventory" in the Media  tab for reviewed elements of pertinent patient history.  [name] participated in the discussions, expressed understanding, and voiced agreement with the above plans.  All questions were answered to her satisfaction. she is encouraged to contact clinic should she have any questions or concerns prior to her return visit.   Follow up plan: - Return in about 1 week (around 03/19/2020) for F/U with Meter and Logs Only - no Labs.  Glade Lloyd, MD [facility] [address] [place], [place] [address] Phone: [phone]  Fax: [phone]    03/12/2020, 3:58 PM  This note was partially dictated with voice recognition software. Similar sounding words can be transcribed inadequately or may not  be corrected upon review.

## 2020-03-20 ENCOUNTER — Ambulatory Visit: Payer: BC Managed Care – PPO | Admitting: "Endocrinology

## 2020-03-27 ENCOUNTER — Encounter: Payer: Self-pay | Admitting: "Endocrinology

## 2020-03-27 ENCOUNTER — Ambulatory Visit (INDEPENDENT_AMBULATORY_CARE_PROVIDER_SITE_OTHER): Payer: BC Managed Care – PPO | Admitting: "Endocrinology

## 2020-03-27 ENCOUNTER — Other Ambulatory Visit: Payer: Self-pay

## 2020-03-27 VITALS — BP 132/82 | HR 92 | Ht 61.0 in | Wt 227.6 lb

## 2020-03-27 DIAGNOSIS — E1165 Type 2 diabetes mellitus with hyperglycemia: Secondary | ICD-10-CM

## 2020-03-27 DIAGNOSIS — I10 Essential (primary) hypertension: Secondary | ICD-10-CM | POA: Diagnosis not present

## 2020-03-27 DIAGNOSIS — E782 Mixed hyperlipidemia: Secondary | ICD-10-CM

## 2020-03-27 MED ORDER — GLIPIZIDE ER 5 MG PO TB24: 5.0000 mg | ORAL_TABLET | Freq: Every day | ORAL | 3 refills | Status: DC

## 2020-03-27 MED ORDER — JANUVIA 100 MG PO TABS: 100.0000 mg | ORAL_TABLET | Freq: Every day | ORAL | 3 refills | Status: DC

## 2020-03-27 NOTE — Patient Instructions (Signed)

## 2020-03-27 NOTE — Progress Notes (Signed)
03/27/2020, 5:26 PM   Endocrinology follow-up note   Subjective:    Patient ID: [name], female    DOB: [birth year].  [name] is being seen in follow-up in the management of her currently uncontrolled type 2 diabetes, hypertension. [name], Brittney Melnick, [name].   Past Medical History:  Diagnosis Date  . Allergy   . Asthma   . COPD (chronic obstructive pulmonary disease) ([place])   . Diabetes mellitus without complication ([place])   . Hypertension   . Morbid obesity due to excess calories ([place])   . Umbilical hernia     Past Surgical History:  Procedure Laterality Date  . CARPAL TUNNEL RELEASE Right   . CHOLECYSTECTOMY    . COLONOSCOPY N/A 01/12/2015   Procedure: COLONOSCOPY;  Surgeon: Danie Binder, [name];  Location: [facility];  Service: Endoscopy;  Laterality: N/A;  0830  . HYSTEROSCOPY WITH D & C N/A 04/01/2013   Procedure: DILATATION AND CURETTAGE /HYSTEROSCOPY;  Surgeon: Florian Buff, [name];  Location: [facility] OR[facility];  Service: Gynecology;  Laterality: N/A;  . NASAL POLYP SURGERY      Social History   Socioeconomic History  . Marital status: Married    Spouse name: Not on file  . Number of children: 2  . Years of education: Not on file  . Highest education level: Not on file  Occupational History  . Not on file  Tobacco Use  . Smoking status: Former Smoker    Packs/day: 0.50    Years: 20.00    Pack years: 10.00    Types: Cigarettes    Quit date: 03/25/1997    Years since quitting: 23.0  . Smokeless tobacco: Never Used  Vaping Use  . Vaping Use: Never used  Substance and Sexual Activity  . Alcohol use: Yes    Alcohol/week: 0.0 standard drinks    Comment: Ocassional  . Drug use: No  . Sexual activity: Yes    Birth control/protection: None, Post-menopausal  Other Topics Concern  . Not on file  Social History Narrative  . Not on file   Social Determinants of Health   Financial  Resource Strain:   . Difficulty of Paying Living Expenses: Not on file  Food Insecurity:   . Worried About Charity fundraiser in the Last Year: Not on file  . Ran Out of Food in the Last Year: Not on file  Transportation Needs:   . Lack of Transportation (Medical): Not on file  . Lack of Transportation (Non-Medical): Not on file  Physical Activity:   . Days of Exercise per Week: Not on file  . Minutes of Exercise per Session: Not on file  Stress:   . Feeling of Stress : Not on file  Social Connections:   . Frequency of Communication with Friends and Family: Not on file  . Frequency of Social Gatherings with Friends and Family: Not on file  . Attends Religious Services: Not on file  . Active Member of Clubs or Organizations: Not on file  . Attends Archivist Meetings: Not on file  . Marital Status: Not on file    Family History  Problem Relation Age of Onset  .  Diabetes Mother   . Kidney disease Mother   . Other Mother        renal failure  . Hypertension Mother   . Cancer Mother        ?uterine cancer   . Diabetes Father   . Cancer Father        pancreatic  . Diabetes Sister   . Kidney disease Sister   . Diabetes Maternal Grandmother   . Asthma Maternal Grandmother   . COPD Maternal Grandmother        bronchitis  . Hypertension Maternal Grandmother   . Thyroid disease Maternal Grandmother   . Hypertension Daughter   . Colon cancer Neg Hx     Outpatient Encounter Medications as of 03/27/2020  Medication Sig  . aspirin EC 81 MG tablet Take 81 mg by mouth daily.  . B-D UF III MINI PEN NEEDLES 31G X 5 MM MISC USE ONCE DAILY  . diclofenac sodium (VOLTAREN) 1 % GEL 4 (four) times daily as needed.  . fluticasone (FLONASE) 50 MCG/ACT nasal spray Place 2 sprays into both nostrils daily.  . Fluticasone-Salmeterol (ADVAIR) 250-50 MCG/DOSE AEPB Inhale 1 puff into the lungs 2 (two) times daily.   Marland Kitchen glipiZIDE (GLUCOTROL XL) 5 MG 24 hr tablet Take 1 tablet (5 mg  total) by mouth daily with breakfast.  . Insulin Glargine (BASAGLAR KWIKPEN) 100 UNIT/ML SOPN Inject 60 Units into the skin in the morning and at bedtime.  Marland Kitchen ipratropium-albuterol (DUONEB) 0.5-2.5 (3) MG/3ML SOLN Take 3 mLs by nebulization every 4 (four) hours as needed (Shortness of Breath).   Marland Kitchen JANUVIA 100 MG tablet Take 1 tablet (100 mg total) by mouth daily.  Marland Kitchen lisinopril (PRINIVIL,ZESTRIL) 40 MG tablet Take 40 mg by mouth daily.  Marland Kitchen loratadine (CLARITIN) 10 MG tablet Take 10 mg by mouth daily.  . metFORMIN (GLUCOPHAGE) 500 MG tablet Take 1 tablet (500 mg total) by mouth 2 (two) times daily with a meal.  . Multiple Vitamin (MULTIVITAMIN WITH MINERALS) TABS tablet Take 1 tablet by mouth daily.  . rosuvastatin (CRESTOR) 20 MG tablet Take 20 mg by mouth daily.   . traMADol (ULTRAM) 50 MG tablet Take 50 mg by mouth 2 (two) times daily.  . [DISCONTINUED] glucose blood (ACCU-CHEK GUIDE) test strip Use as instructed  . [DISCONTINUED] JANUVIA 100 MG tablet TAKE 1 TABLET BY MOUTH EVERY DAY  . [DISCONTINUED] medroxyPROGESTERone (PROVERA) 10 MG tablet TAKE ONE TABLET DAILY FOR 2 WEEKS-REPEAT AS DIRECTED   No facility-administered encounter medications on file as of 03/27/2020.    ALLERGIES: Allergies  Allergen Reactions  . Gabapentin Itching    VACCINATION STATUS: Immunization History  Administered Date(s) Administered  . Influenza-Unspecified 06/09/2010, 06/09/2013    Diabetes She presents for her follow-up diabetic visit. She has type 2 diabetes mellitus. Onset time: She was diagnosed at approximate age of 59 years. Her disease course has been worsening. There are no hypoglycemic associated symptoms. Pertinent negatives for hypoglycemia include no confusion, headaches, pallor or seizures. Associated symptoms include polydipsia and polyuria. Pertinent negatives for diabetes include no chest pain, no fatigue and no polyphagia. There are no hypoglycemic complications. Symptoms are worsening.  There are no diabetic complications. Risk factors for coronary artery disease include diabetes mellitus, dyslipidemia, family history, obesity, tobacco exposure, sedentary lifestyle, post-menopausal and hypertension. Current diabetic treatment includes insulin injections (She is currently on Basaglar 60 units nightly, Metformin 1000 mg p.o. twice daily, Januvia 100 mg p.o. daily.). Her weight is fluctuating minimally. She is following  a generally unhealthy diet. When asked about meal planning, she reported none. She has not had a previous visit with a dietitian. She rarely participates in exercise. Her home blood glucose trend is decreasing steadily. Her breakfast blood glucose range is generally >200 mg/dl. Her lunch blood glucose range is generally >200 mg/dl. Her dinner blood glucose range is generally >200 mg/dl. Her bedtime blood glucose range is generally >200 mg/dl. Her overall blood glucose range is >200 mg/dl. (She presents with inadequate blood glucose monitoring.  Her recent A1c was 13.2%.  Her average blood glucose is between 250-300 mg per DL.  She did not document hypoglycemia.   ) An ACE inhibitor/angiotensin II receptor blocker is being taken.  Hyperlipidemia This is a chronic problem. The current episode started more than 1 year ago. The problem is controlled. Exacerbating diseases include diabetes and obesity. Pertinent negatives include no chest pain, myalgias or shortness of breath. Current antihyperlipidemic treatment includes statins. Risk factors for coronary artery disease include diabetes mellitus, dyslipidemia, family history, hypertension, obesity, a sedentary lifestyle and post-menopausal.  Hypertension This is a chronic problem. The current episode started more than 1 year ago. Pertinent negatives include no chest pain, headaches, palpitations or shortness of breath. Risk factors for coronary artery disease include diabetes mellitus, dyslipidemia, obesity, post-menopausal state,  smoking/tobacco exposure, sedentary lifestyle and family history. Past treatments include ACE inhibitors.    Review of systems  Constitutional: + Minimally fluctuating body weight,  current  Body mass index is 43 kg/m. , no fatigue, no subjective hyperthermia, no subjective hypothermia Eyes: no blurry vision, no xerophthalmia ENT: no sore throat, no nodules palpated in throat, no dysphagia/odynophagia, no hoarseness Cardiovascular: no Chest Pain, no Shortness of Breath, no palpitations, no leg swelling Respiratory: no cough, no shortness of breath Gastrointestinal: no Nausea/Vomiting/Diarhhea Musculoskeletal: no muscle/joint aches Skin: no rashes, no hyperemia Neurological: no tremors, no numbness, no tingling, no dizziness Psychiatric: no depression, no anxiety   Objective:    BP 132/82   Pulse 92   Ht 5\' 1"  (1.549 m)   Wt 227 lb 9.6 oz (103.2 kg)   LMP 02/25/2019   BMI 43.00 kg/m   Wt Readings from Last 3 Encounters:  03/27/20 227 lb 9.6 oz (103.2 kg)  03/12/20 228 lb (103.4 kg)  08/22/19 228 lb (103.4 kg)    Physical Exam- Limited  Constitutional:  Body mass index is 43 kg/m. , not in acute distress, normal state of mind Eyes:  EOMI, no exophthalmos Neck: Supple Thyroid: No gross goiter Respiratory: Adequate breathing efforts Musculoskeletal: no gross deformities, strength intact in all four extremities, no gross restriction of joint movements Skin:  no rashes, no hyperemia Neurological: no tremor with outstretched hands,    CMP     Component Value Date/Time   NA 139 04/21/2019 0854   K 4.3 04/21/2019 0854   CL 103 04/21/2019 0854   CO2 23 04/21/2019 0854   GLUCOSE 295 (H) 04/21/2019 0854   BUN 15 04/21/2019 0854   CREATININE 0.68 04/21/2019 0854   CALCIUM 10.1 04/21/2019 0854   PROT 7.7 04/21/2019 0854   ALBUMIN 4.4 04/21/2019 0854   AST 27 04/21/2019 0854   ALT 26 04/21/2019 0854   ALKPHOS 67 04/21/2019 0854   BILITOT 0.6 04/21/2019 0854   GFRNONAA  >60 04/21/2019 0854   GFRAA >60 04/21/2019 0854     Diabetic Labs (most recent): Lab Results  Component Value Date   HGBA1C 13.2 (A) 03/12/2020   HGBA1C 10.3 (A) 08/22/2019  HGBA1C 12.9 (H) 04/21/2019     Lipid Panel ( most recent) Lipid Panel     Component Value Date/Time   CHOL 134 04/21/2019 0855   TRIG 139 04/21/2019 0855   HDL 38 (L) 04/21/2019 0855   CHOLHDL 3.5 04/21/2019 0855   VLDL 28 04/21/2019 0855   LDLCALC 68 04/21/2019 0855      Lab Results  Component Value Date   TSH 2.110 01/16/2020   TSH 3.399 03/07/2013   FREET4 1.06 01/16/2020     Assessment & Plan:   1. Uncontrolled type 2 diabetes mellitus with hyperglycemia ([place])  - Yee L Pontarelli has currently uncontrolled symptomatic type 2 DM since 59 years of age.  She presents with inadequate blood glucose monitoring.  Her recent A1c was 13.2%.  Her average blood glucose is between 250-300 mg per DL.  She did not document hypoglycemia.     - I had a long discussion with her about the progressive nature of diabetes and the pathology behind its complications. -her diabetes is complicated by obesity, non- engagement for management plan,  history of smoking and she remains at a high risk for more acute and chronic complications which include CAD, CVA, CKD, retinopathy, and neuropathy. These are all discussed in detail with her.  - I have counseled her on diet  and weight management  by adopting a carbohydrate restricted/protein rich diet. Patient is encouraged to switch to  unprocessed or minimally processed     complex starch and increased protein intake (animal or plant source), fruits, and vegetables. -  she is advised to stick to a routine mealtimes to eat 3 meals  a day and avoid unnecessary snacks ( to snack only to correct hypoglycemia).   - she  admits there is a room for improvement in her diet and drink choices. -  Suggestion is made for her to avoid simple carbohydrates  from her diet including  Cakes, Sweet Desserts / Pastries, Ice Cream, Soda (diet and regular), Sweet Tea, Candies, Chips, Cookies, Sweet Pastries,  Store Bought Juices, Alcohol in Excess of  1-2 drinks a day, Artificial Sweeteners, Coffee Creamer, and "Sugar-free" Products. This will help patient to have stable blood glucose profile and potentially avoid unintended weight gain.   - she will be scheduled with Jearld Fenton, RDN, CDE for diabetes education.  - I have approached her with the following individualized plan to manage  her diabetes and patient agrees:    -Patient remains on committed for proper monitoring of blood glucose.  She will likely need intensive treatment with basal/bolus insulin in order for her to achieve and maintain control of diabetes to target.    -In preparation, she is advised to increase her Basaglar to 60 units twice daily , urged to start monitoring blood glucose 4 times a day-  before meals and at bedtime and return in 4 weeks with her meter and logs for evaluation.    - she is warned not to take insulin without proper monitoring per orders.  - she is encouraged to call clinic for blood glucose levels less than 70 or above 300 mg /dl. - she is advised to continue Metformin 500 mg p.o. twice daily, continue Januvia 100 mg p.o. daily, therapeutically suitable for patient . -I discussed and added glipizide 5 mg XL p.o. daily at breakfast.  - Specific targets for  A1c;  LDL, HDL,  and Triglycerides were discussed with the patient.  2) Blood Pressure /Hypertension:   Her blood pressure  is controlled to target.   she is advised to continue her current medications including lisinopril 40 mg p.o. daily with breakfast .  3) Lipids/Hyperlipidemia:   Review of her recent lipid panel showed  controlled  LDL at 68 .  she  is advised to continue Crestor 20 mg p.o. daily at bedtime.     Side effects and precautions discussed with her.  4)  Weight/Diet: Her BMI is 43.00 -   clearly complicating her  diabetes care.   she is  a candidate for modest weight loss. I discussed with her the fact that loss of 5 - 10% of her  current body weight will have the most impact on her diabetes management.  Exercise, and detailed carbohydrates information provided  -  detailed on discharge instructions.  5) Chronic Care/Health Maintenance:  -she  is on ACEI/ARB and Statin medications and  is encouraged to initiate and continue to follow up with Ophthalmology, Dentist,  Podiatrist at least yearly or according to recommendations, and advised to  stay away from smoking. I have recommended yearly flu vaccine and pneumonia vaccine at least every 5 years; moderate intensity exercise for up to 150 minutes weekly; and  sleep for at least 7 hours a day.  - she is  advised to maintain close follow up with Rosita Fire, [name] for primary care needs, as well as her other providers for optimal and coordinated care.  - Time spent on this patient care encounter:  35 min, of which > 50% was spent in  counseling and the rest reviewing her blood glucose logs , discussing her hypoglycemia and hyperglycemia episodes, reviewing her current and  previous labs / studies  ( including abstraction from other facilities) and medications  doses and developing a  long term treatment plan and documenting her care.   Please refer to Patient Instructions for Blood Glucose Monitoring and Insulin/Medications Dosing Guide"  in media tab for additional information. Please  also refer to " Patient Self Inventory" in the Media  tab for reviewed elements of pertinent patient history.  Larry L Kaman participated in the discussions, expressed understanding, and voiced agreement with the above plans.  All questions were answered to her satisfaction. she is encouraged to contact clinic should she have any questions or concerns prior to her return visit.    Follow up plan: - Return in about 4 weeks (around 04/24/2020) for F/U with Meter and Logs Only - no  Labs.  Glade Lloyd, [name] [facility] [address] [place], [place] [address] Phone: [phone]  Fax: [phone]    03/27/2020, 5:26 PM  This note was partially dictated with voice recognition software. Similar sounding words can be transcribed inadequately or may not  be corrected upon review.

## 2020-04-17 DIAGNOSIS — J4530 Mild persistent asthma, uncomplicated: Secondary | ICD-10-CM | POA: Diagnosis not present

## 2020-04-17 DIAGNOSIS — E1142 Type 2 diabetes mellitus with diabetic polyneuropathy: Secondary | ICD-10-CM | POA: Diagnosis not present

## 2020-04-17 DIAGNOSIS — I10 Essential (primary) hypertension: Secondary | ICD-10-CM | POA: Diagnosis not present

## 2020-04-17 DIAGNOSIS — Z23 Encounter for immunization: Secondary | ICD-10-CM | POA: Diagnosis not present

## 2020-04-26 ENCOUNTER — Encounter: Payer: Self-pay | Admitting: "Endocrinology

## 2020-04-26 ENCOUNTER — Ambulatory Visit (INDEPENDENT_AMBULATORY_CARE_PROVIDER_SITE_OTHER): Payer: BC Managed Care – PPO | Admitting: "Endocrinology

## 2020-04-26 ENCOUNTER — Other Ambulatory Visit: Payer: Self-pay

## 2020-04-26 ENCOUNTER — Ambulatory Visit: Payer: BC Managed Care – PPO | Admitting: "Endocrinology

## 2020-04-26 VITALS — BP 168/94 | HR 90 | Ht 61.0 in | Wt 230.2 lb

## 2020-04-26 DIAGNOSIS — I10 Essential (primary) hypertension: Secondary | ICD-10-CM | POA: Diagnosis not present

## 2020-04-26 DIAGNOSIS — E782 Mixed hyperlipidemia: Secondary | ICD-10-CM | POA: Diagnosis not present

## 2020-04-26 DIAGNOSIS — E1165 Type 2 diabetes mellitus with hyperglycemia: Secondary | ICD-10-CM

## 2020-04-26 NOTE — Patient Instructions (Signed)

## 2020-04-26 NOTE — Progress Notes (Signed)
04/26/2020, 3:54 PM   Endocrinology follow-up note   Subjective:    Patient ID: [name], female    DOB: [birth year].  [name] is being seen in follow-up in the management of her currently uncontrolled type 2 diabetes, hypertension. IRW:[id number], Brandon Melnick, MD.   Past Medical History:  Diagnosis Date  . Allergy   . Asthma   . COPD (chronic obstructive pulmonary disease) ([place])   . Diabetes mellitus without complication ([place])   . Hypertension   . Morbid obesity due to excess calories ([place])   . Umbilical hernia     Past Surgical History:  Procedure Laterality Date  . CARPAL TUNNEL RELEASE Right   . CHOLECYSTECTOMY    . COLONOSCOPY N/A 01/12/2015   Procedure: COLONOSCOPY;  Surgeon: Danie Binder, MD;  Location: [facility];  Service: Endoscopy;  Laterality: N/A;  0830  . HYSTEROSCOPY WITH D & C N/A 04/01/2013   Procedure: DILATATION AND CURETTAGE /HYSTEROSCOPY;  Surgeon: Florian Buff, MD;  Location: [facility] ORS;  Service: Gynecology;  Laterality: N/A;  . NASAL POLYP SURGERY      Social History   Socioeconomic History  . Marital status: Married    Spouse name: Not on file  . Number of children: 2  . Years of education: Not on file  . Highest education level: Not on file  Occupational History  . Not on file  Tobacco Use  . Smoking status: Former Smoker    Packs/day: 0.50    Years: 20.00    Pack years: 10.00    Types: Cigarettes    Quit date: 03/25/1997    Years since quitting: 23.1  . Smokeless tobacco: Never Used  Vaping Use  . Vaping Use: Never used  Substance and Sexual Activity  . Alcohol use: Yes    Alcohol/week: 0.0 standard drinks    Comment: Ocassional  . Drug use: No  . Sexual activity: Yes    Birth control/protection: None, Post-menopausal  Other Topics Concern  . Not on file  Social History Narrative  . Not on file   Social Determinants of Health   Financial  Resource Strain:   . Difficulty of Paying Living Expenses: Not on file  Food Insecurity:   . Worried About Charity fundraiser in the Last Year: Not on file  . Ran Out of Food in the Last Year: Not on file  Transportation Needs:   . Lack of Transportation (Medical): Not on file  . Lack of Transportation (Non-Medical): Not on file  Physical Activity:   . Days of Exercise per Week: Not on file  . Minutes of Exercise per Session: Not on file  Stress:   . Feeling of Stress : Not on file  Social Connections:   . Frequency of Communication with Friends and Family: Not on file  . Frequency of Social Gatherings with Friends and Family: Not on file  . Attends Religious Services: Not on file  . Active Member of Clubs or Organizations: Not on file  . Attends Archivist Meetings: Not on file  . Marital Status: Not on file    Family History  Problem Relation Age of Onset  .  Diabetes Mother   . Kidney disease Mother   . Other Mother        renal failure  . Hypertension Mother   . Cancer Mother        ?uterine cancer   . Diabetes Father   . Cancer Father        pancreatic  . Diabetes Sister   . Kidney disease Sister   . Diabetes Maternal Grandmother   . Asthma Maternal Grandmother   . COPD Maternal Grandmother        bronchitis  . Hypertension Maternal Grandmother   . Thyroid disease Maternal Grandmother   . Hypertension Daughter   . Colon cancer Neg Hx     Outpatient Encounter Medications as of 04/26/2020  Medication Sig  . aspirin EC 81 MG tablet Take 81 mg by mouth daily.  . B-D UF III MINI PEN NEEDLES 31G X 5 MM MISC USE ONCE DAILY  . diclofenac sodium (VOLTAREN) 1 % GEL 4 (four) times daily as needed.  . fluticasone (FLONASE) 50 MCG/ACT nasal spray Place 2 sprays into both nostrils daily.  . Fluticasone-Salmeterol (ADVAIR) 250-50 MCG/DOSE AEPB Inhale 1 puff into the lungs 2 (two) times daily.   Marland Kitchen glipiZIDE (GLUCOTROL XL) 5 MG 24 hr tablet Take 1 tablet (5 mg  total) by mouth daily with breakfast.  . Insulin Glargine (BASAGLAR KWIKPEN) 100 UNIT/ML SOPN Inject 60 Units into the skin in the morning and at bedtime.  Marland Kitchen ipratropium-albuterol (DUONEB) 0.5-2.5 (3) MG/3ML SOLN Take 3 mLs by nebulization every 4 (four) hours as needed (Shortness of Breath).   Marland Kitchen JANUVIA 100 MG tablet Take 1 tablet (100 mg total) by mouth daily.  Marland Kitchen lisinopril (PRINIVIL,ZESTRIL) 40 MG tablet Take 40 mg by mouth daily.  Marland Kitchen loratadine (CLARITIN) 10 MG tablet Take 10 mg by mouth daily.  . metFORMIN (GLUCOPHAGE) 500 MG tablet Take 1 tablet (500 mg total) by mouth 2 (two) times daily with a meal.  . Multiple Vitamin (MULTIVITAMIN WITH MINERALS) TABS tablet Take 1 tablet by mouth daily.  . rosuvastatin (CRESTOR) 20 MG tablet Take 20 mg by mouth daily.   . traMADol (ULTRAM) 50 MG tablet Take 50 mg by mouth 2 (two) times daily.   No facility-administered encounter medications on file as of 04/26/2020.    ALLERGIES: Allergies  Allergen Reactions  . Gabapentin Itching    VACCINATION STATUS: Immunization History  Administered Date(s) Administered  . Influenza-Unspecified 06/09/2010, 06/09/2013    Diabetes She presents for her follow-up diabetic visit. She has type 2 diabetes mellitus. Onset time: She was diagnosed at approximate age of 31 years. Her disease course has been improving. There are no hypoglycemic associated symptoms. Pertinent negatives for hypoglycemia include no confusion, headaches, pallor or seizures. Associated symptoms include polydipsia and polyuria. Pertinent negatives for diabetes include no chest pain, no fatigue and no polyphagia. There are no hypoglycemic complications. Symptoms are improving. There are no diabetic complications. Risk factors for coronary artery disease include diabetes mellitus, dyslipidemia, family history, obesity, tobacco exposure, sedentary lifestyle, post-menopausal and hypertension. Current diabetic treatment includes insulin injections  (She is currently on Basaglar 60 units nightly, Metformin 1000 mg p.o. twice daily, Januvia 100 mg p.o. daily.). Her weight is fluctuating minimally. She is following a generally unhealthy diet. When asked about meal planning, she reported none. She has not had a previous visit with a dietitian. She rarely participates in exercise. Her home blood glucose trend is decreasing steadily. Her breakfast blood glucose range is generally 130-140  mg/dl. Her lunch blood glucose range is generally 130-140 mg/dl. Her dinner blood glucose range is generally 130-140 mg/dl. Her bedtime blood glucose range is generally 130-140 mg/dl. Her overall blood glucose range is 130-140 mg/dl. (She presents with significant improvement in her glycemic profile averaging between 124-153 over the last 30 days.  This is despite a recent A1c of 13.2%.  He did not document any hypoglycemia. ) An ACE inhibitor/angiotensin II receptor blocker is being taken.  Hyperlipidemia This is a chronic problem. The current episode started more than 1 year ago. The problem is controlled. Exacerbating diseases include diabetes and obesity. Pertinent negatives include no chest pain, myalgias or shortness of breath. Current antihyperlipidemic treatment includes statins. Risk factors for coronary artery disease include diabetes mellitus, dyslipidemia, family history, hypertension, obesity, a sedentary lifestyle and post-menopausal.  Hypertension This is a chronic problem. The current episode started more than 1 year ago. Pertinent negatives include no chest pain, headaches, palpitations or shortness of breath. Risk factors for coronary artery disease include diabetes mellitus, dyslipidemia, obesity, post-menopausal state, smoking/tobacco exposure, sedentary lifestyle and family history. Past treatments include ACE inhibitors.    Review of systems  Constitutional: + Minimally fluctuating body weight,  current  Body mass index is 43.5 kg/m. , no fatigue, no  subjective hyperthermia, no subjective hypothermia Eyes: no blurry vision, no xerophthalmia ENT: no sore throat, no nodules palpated in throat, no dysphagia/odynophagia, no hoarseness Cardiovascular: no Chest Pain, no Shortness of Breath, no palpitations, no leg swelling Respiratory: no cough, no shortness of breath Gastrointestinal: no Nausea/Vomiting/Diarhhea Musculoskeletal: no muscle/joint aches Skin: no rashes, no hyperemia Neurological: no tremors, no numbness, no tingling, no dizziness Psychiatric: no depression, no anxiety   Objective:    BP (!) 168/94   Pulse 90   Ht 5\' 1"  (1.549 m)   Wt 230 lb 3.2 oz (104.4 kg)   LMP 02/25/2019   BMI 43.50 kg/m   Wt Readings from Last 3 Encounters:  04/26/20 230 lb 3.2 oz (104.4 kg)  03/27/20 227 lb 9.6 oz (103.2 kg)  03/12/20 228 lb (103.4 kg)    Physical Exam- Limited  Constitutional:  Body mass index is 43.5 kg/m. , not in acute distress, normal state of mind Eyes:  EOMI, no exophthalmos Neck: Supple Thyroid: No gross goiter Respiratory: Adequate breathing efforts Musculoskeletal: no gross deformities, strength intact in all four extremities, no gross restriction of joint movements Skin:  no rashes, no hyperemia Neurological: no tremor with outstretched hands,    CMP     Component Value Date/Time   NA 139 04/21/2019 0854   K 4.3 04/21/2019 0854   CL 103 04/21/2019 0854   CO2 23 04/21/2019 0854   GLUCOSE 295 (H) 04/21/2019 0854   BUN 15 04/21/2019 0854   CREATININE 0.68 04/21/2019 0854   CALCIUM 10.1 04/21/2019 0854   PROT 7.7 04/21/2019 0854   ALBUMIN 4.4 04/21/2019 0854   AST 27 04/21/2019 0854   ALT 26 04/21/2019 0854   ALKPHOS 67 04/21/2019 0854   BILITOT 0.6 04/21/2019 0854   GFRNONAA >60 04/21/2019 0854   GFRAA >60 04/21/2019 0854     Diabetic Labs (most recent): Lab Results  Component Value Date   HGBA1C 13.2 (A) 03/12/2020   HGBA1C 10.3 (A) 08/22/2019   HGBA1C 12.9 (H) 04/21/2019     Lipid Panel  ( most recent) Lipid Panel     Component Value Date/Time   CHOL 134 04/21/2019 0855   TRIG 139 04/21/2019 0855   HDL 38 (  L) 04/21/2019 0855   CHOLHDL 3.5 04/21/2019 0855   VLDL 28 04/21/2019 0855   LDLCALC 68 04/21/2019 0855      Lab Results  Component Value Date   TSH 2.110 01/16/2020   TSH 3.399 03/07/2013   FREET4 1.06 01/16/2020     Assessment & Plan:   1. Uncontrolled type 2 diabetes mellitus with hyperglycemia ([place])  - Lashelle L Locatelli has currently uncontrolled symptomatic type 2 DM since 59 years of age.  She presents with inadequate blood glucose monitoring.  Her recent A1c was 13.2%.  Her average blood glucose is between 250-300 mg per DL.  She did not document hypoglycemia.     - I had a long discussion with her about the progressive nature of diabetes and the pathology behind its complications. -her diabetes is complicated by obesity, non- engagement for management plan,  history of smoking and she remains at a high risk for more acute and chronic complications which include CAD, CVA, CKD, retinopathy, and neuropathy. These are all discussed in detail with her.  - I have counseled her on diet  and weight management  by adopting a carbohydrate restricted/protein rich diet. Patient is encouraged to switch to  unprocessed or minimally processed     complex starch and increased protein intake (animal or plant source), fruits, and vegetables. -  she is advised to stick to a routine mealtimes to eat 3 meals  a day and avoid unnecessary snacks ( to snack only to correct hypoglycemia).   - she  admits there is a room for improvement in her diet and drink choices. -  Suggestion is made for her to avoid simple carbohydrates  from her diet including Cakes, Sweet Desserts / Pastries, Ice Cream, Soda (diet and regular), Sweet Tea, Candies, Chips, Cookies, Sweet Pastries,  Store Bought Juices, Alcohol in Excess of  1-2 drinks a day, Artificial Sweeteners, Coffee Creamer, and  "Sugar-free" Products. This will help patient to have stable blood glucose profile and potentially avoid unintended weight gain.  - she has been scheduled with Jearld Fenton, RDN, CDE for diabetes education.  - I have approached her with the following individualized plan to manage  her diabetes and patient agrees:    -Patient returns with better commitment for monitoring blood glucose and injected insulin accordingly.  She is advised to continue Basaglar 60 units twice daily,  she is warned not to take insulin without proper monitoring per orders.  - she is encouraged to call clinic for blood glucose levels less than 70 or above 300 mg /dl. - she is advised to continue Metformin 500 mg p.o. twice daily, continue Januvia 100 mg p.o. daily, therapeutically suitable for patient . -She is advised to continue  glipizide 5 mg XL p.o. daily at breakfast.  - Specific targets for  A1c;  LDL, HDL,  and Triglycerides were discussed with the patient.  2) Blood Pressure /Hypertension:   -Her blood pressures not controlled to target.   she is advised to continue her current medications including lisinopril 40 mg p.o. daily with breakfast .  3) Lipids/Hyperlipidemia:   Review of her recent lipid panel showed  controlled  LDL at 68 .  she  is advised to continue Crestor 20 mg p.o. nightly.   Side effects and precautions discussed with her.  4)  Weight/Diet: Her BMI is 43.50 -   clearly complicating her diabetes care.   she is  a candidate for modest weight loss. I discussed with her the  fact that loss of 5 - 10% of her  current body weight will have the most impact on her diabetes management.  Exercise, and detailed carbohydrates information provided  -  detailed on discharge instructions.  5) Chronic Care/Health Maintenance:  -she  is on ACEI/ARB and Statin medications and  is encouraged to initiate and continue to follow up with Ophthalmology, Dentist,  Podiatrist at least yearly or according to  recommendations, and advised to  stay away from smoking. I have recommended yearly flu vaccine and pneumonia vaccine at least every 5 years; moderate intensity exercise for up to 150 minutes weekly; and  sleep for at least 7 hours a day.  POCT ABI Results 04/26/20  ABI on April 26, 2020 Right ABI: 1.11      left ABI: 1.13  Right leg systolic / diastolic: 250/53 mmHg Left leg systolic / diastolic: 976/73 mmHg  Arm systolic / diastolic: 419/37 mmHG This study is normal, will be repeated in 5 years or sooner if needed.  - she is  advised to maintain close follow up with Rosita Fire, MD for primary care needs, as well as her other providers for optimal and coordinated care.  - Time spent on this patient care encounter:  35 min, of which > 50% was spent in  counseling and the rest reviewing her blood glucose logs , discussing her hypoglycemia and hyperglycemia episodes, reviewing her current and  previous labs / studies  ( including abstraction from other facilities) and medications  doses and developing a  long term treatment plan and documenting her care.   Please refer to Patient Instructions for Blood Glucose Monitoring and Insulin/Medications Dosing Guide"  in media tab for additional information. Please  also refer to " Patient Self Inventory" in the Media  tab for reviewed elements of pertinent patient history.  Athalie L Pinder participated in the discussions, expressed understanding, and voiced agreement with the above plans.  All questions were answered to her satisfaction. she is encouraged to contact clinic should she have any questions or concerns prior to her return visit.     Follow up plan: - Return in about 9 weeks (around 06/28/2020) for F/U with Pre-visit Labs, Meter, Logs, A1c here., Urine MA - NV.  Glade Lloyd, MD [facility] [address] [place], [place] [address] Phone: [phone]  Fax: [phone]     04/26/2020, 3:54 PM  This note was partially dictated with voice recognition software. Similar sounding words can be transcribed inadequately or may not  be corrected upon review.

## 2020-05-01 ENCOUNTER — Other Ambulatory Visit: Payer: Self-pay | Admitting: "Endocrinology

## 2020-05-07 ENCOUNTER — Telehealth: Payer: Self-pay

## 2020-05-07 ENCOUNTER — Other Ambulatory Visit: Payer: Self-pay

## 2020-05-07 DIAGNOSIS — E1165 Type 2 diabetes mellitus with hyperglycemia: Secondary | ICD-10-CM

## 2020-05-07 MED ORDER — BASAGLAR KWIKPEN 100 UNIT/ML ~~LOC~~ SOPN: 60.0000 [IU] | PEN_INJECTOR | Freq: Two times a day (BID) | SUBCUTANEOUS | 3 refills | Status: DC

## 2020-05-07 NOTE — Telephone Encounter (Signed)
Patient states that the pharmacy will not refill her insulin until January. She is saying the dosage is wrong and needs that corrected. Please advise.

## 2020-05-07 NOTE — Telephone Encounter (Signed)
Sent in

## 2020-05-28 DIAGNOSIS — Z794 Long term (current) use of insulin: Secondary | ICD-10-CM | POA: Diagnosis not present

## 2020-05-28 DIAGNOSIS — H524 Presbyopia: Secondary | ICD-10-CM | POA: Diagnosis not present

## 2020-05-28 DIAGNOSIS — E119 Type 2 diabetes mellitus without complications: Secondary | ICD-10-CM | POA: Diagnosis not present

## 2020-05-28 LAB — HM DIABETES EYE EXAM

## 2020-06-21 ENCOUNTER — Other Ambulatory Visit: Payer: Self-pay | Admitting: "Endocrinology

## 2020-06-21 DIAGNOSIS — E1165 Type 2 diabetes mellitus with hyperglycemia: Secondary | ICD-10-CM | POA: Diagnosis not present

## 2020-06-21 DIAGNOSIS — J309 Allergic rhinitis, unspecified: Secondary | ICD-10-CM | POA: Diagnosis not present

## 2020-06-21 DIAGNOSIS — H6092 Unspecified otitis externa, left ear: Secondary | ICD-10-CM | POA: Diagnosis not present

## 2020-06-28 ENCOUNTER — Ambulatory Visit: Payer: BC Managed Care – PPO | Admitting: "Endocrinology

## 2020-06-29 ENCOUNTER — Other Ambulatory Visit: Payer: Self-pay | Admitting: "Endocrinology

## 2020-06-29 DIAGNOSIS — E1165 Type 2 diabetes mellitus with hyperglycemia: Secondary | ICD-10-CM

## 2020-06-29 MED ORDER — INSULIN GLARGINE (2 UNIT DIAL) 300 UNIT/ML ~~LOC~~ SOPN: 60.0000 [IU] | PEN_INJECTOR | Freq: Two times a day (BID) | SUBCUTANEOUS | 0 refills | Status: DC

## 2020-07-03 ENCOUNTER — Other Ambulatory Visit: Payer: BC Managed Care – PPO

## 2020-07-08 ENCOUNTER — Other Ambulatory Visit: Payer: Self-pay | Admitting: "Endocrinology

## 2020-07-09 ENCOUNTER — Telehealth: Payer: Self-pay | Admitting: "Endocrinology

## 2020-07-09 NOTE — Telephone Encounter (Signed)
Pt called and said that she has been taking basaglar and now Toujeo was called in. Pt is confused on why it was changed. Cb# [phone]

## 2020-07-10 NOTE — Telephone Encounter (Signed)
Discussed with pt that with the beginning of the year and insurance changes we substituted toujeo in the place of basaglar. Understanding voiced.

## 2020-07-10 NOTE — Telephone Encounter (Signed)
Left a message requesting a return call to the office. 

## 2020-07-18 ENCOUNTER — Encounter: Payer: Self-pay | Admitting: "Endocrinology

## 2020-07-18 ENCOUNTER — Ambulatory Visit (INDEPENDENT_AMBULATORY_CARE_PROVIDER_SITE_OTHER): Payer: BC Managed Care – PPO | Admitting: "Endocrinology

## 2020-07-18 ENCOUNTER — Other Ambulatory Visit: Payer: Self-pay

## 2020-07-18 VITALS — BP 150/84 | HR 84 | Ht 61.0 in | Wt 235.0 lb

## 2020-07-18 DIAGNOSIS — E782 Mixed hyperlipidemia: Secondary | ICD-10-CM

## 2020-07-18 DIAGNOSIS — E1165 Type 2 diabetes mellitus with hyperglycemia: Secondary | ICD-10-CM

## 2020-07-18 DIAGNOSIS — I10 Essential (primary) hypertension: Secondary | ICD-10-CM | POA: Diagnosis not present

## 2020-07-18 LAB — POCT GLYCOSYLATED HEMOGLOBIN (HGB A1C): HbA1c, POC (controlled diabetic range): 8.6 % — AB (ref 0.0–7.0)

## 2020-07-18 MED ORDER — INSULIN GLARGINE (2 UNIT DIAL) 300 UNIT/ML ~~LOC~~ SOPN
70.0000 [IU] | PEN_INJECTOR | Freq: Two times a day (BID) | SUBCUTANEOUS | 0 refills | Status: DC
Start: 2020-07-18 — End: 2020-07-30

## 2020-07-18 NOTE — Progress Notes (Signed)
07/18/2020, 8:52 PM   Endocrinology follow-up note   Subjective:    Patient ID: [name], female    DOB: Dec 14, 1960.  [name] is being seen in follow-up in the management of her currently uncontrolled type 2 diabetes, hypertension. VVO:HYWVP, Brandon Melnick, MD.   Past Medical History:  Diagnosis Date  . Allergy   . Asthma   . COPD (chronic obstructive pulmonary disease) ([place])   . Diabetes mellitus without complication ([place])   . Hypertension   . Morbid obesity due to excess calories ([place])   . Umbilical hernia     Past Surgical History:  Procedure Laterality Date  . CARPAL TUNNEL RELEASE Right   . CHOLECYSTECTOMY    . COLONOSCOPY N/A 01/12/2015   Procedure: COLONOSCOPY;  Surgeon: Danie Binder, MD;  Location: [facility];  Service: Endoscopy;  Laterality: N/A;  0830  . HYSTEROSCOPY WITH D & C N/A 04/01/2013   Procedure: DILATATION AND CURETTAGE /HYSTEROSCOPY;  Surgeon: Florian Buff, MD;  Location: [facility] ORS;  Service: Gynecology;  Laterality: N/A;  . NASAL POLYP SURGERY      Social History   Socioeconomic History  . Marital status: Married    Spouse name: Not on file  . Number of children: 2  . Years of education: Not on file  . Highest education level: Not on file  Occupational History  . Not on file  Tobacco Use  . Smoking status: Former Smoker    Packs/day: 0.50    Years: 20.00    Pack years: 10.00    Types: Cigarettes    Quit date: 03/25/1997    Years since quitting: 23.3  . Smokeless tobacco: Never Used  Vaping Use  . Vaping Use: Never used  Substance and Sexual Activity  . Alcohol use: Yes    Alcohol/week: 0.0 standard drinks    Comment: Ocassional  . Drug use: No  . Sexual activity: Yes    Birth control/protection: None, Post-menopausal  Other Topics Concern  . Not on file  Social History Narrative  . Not on file   Social Determinants of Health   Financial Resource  Strain: Not on file  Food Insecurity: Not on file  Transportation Needs: Not on file  Physical Activity: Not on file  Stress: Not on file  Social Connections: Not on file    Family History  Problem Relation Cohen of Onset  . Diabetes Mother   . Kidney disease Mother   . Other Mother        renal failure  . Hypertension Mother   . Cancer Mother        ?uterine cancer   . Diabetes Father   . Cancer Father        pancreatic  . Diabetes Sister   . Kidney disease Sister   . Diabetes Maternal Grandmother   . Asthma Maternal Grandmother   . COPD Maternal Grandmother        bronchitis  . Hypertension Maternal Grandmother   . Thyroid disease Maternal Grandmother   . Hypertension Daughter   . Colon cancer Neg Hx     Outpatient Encounter Medications as of 07/18/2020  Medication Sig  .  aspirin EC 81 MG tablet Take 81 mg by mouth daily.  . B-D UF III MINI PEN NEEDLES 31G X 5 MM MISC USE ONCE DAILY  . diclofenac sodium (VOLTAREN) 1 % GEL 4 (four) times daily as needed.  . fluticasone (FLONASE) 50 MCG/ACT nasal spray Place 2 sprays into both nostrils daily.  . Fluticasone-Salmeterol (ADVAIR) 250-50 MCG/DOSE AEPB Inhale 1 puff into the lungs 2 (two) times daily.   Marland Kitchen glipiZIDE (GLUCOTROL XL) 5 MG 24 hr tablet TAKE 1 TABLET BY MOUTH EVERY DAY WITH BREAKFAST  . insulin glargine, 2 Unit Dial, (TOUJEO MAX) 300 UNIT/ML Solostar Pen Inject 70 Units into the skin 2 (two) times daily.  Marland Kitchen ipratropium-albuterol (DUONEB) 0.5-2.5 (3) MG/3ML SOLN Take 3 mLs by nebulization every 4 (four) hours as needed (Shortness of Breath).   Marland Kitchen lisinopril (PRINIVIL,ZESTRIL) 40 MG tablet Take 40 mg by mouth daily.  Marland Kitchen loratadine (CLARITIN) 10 MG tablet Take 10 mg by mouth daily.  . metFORMIN (GLUCOPHAGE) 500 MG tablet TAKE 1 TABLET (500 MG TOTAL) BY MOUTH 2 (TWO) TIMES DAILY WITH A MEAL.  . Multiple Vitamin (MULTIVITAMIN WITH MINERALS) TABS tablet Take 1 tablet by mouth daily.  . rosuvastatin (CRESTOR) 20 MG tablet Take  20 mg by mouth daily.   . traMADol (ULTRAM) 50 MG tablet Take 50 mg by mouth 2 (two) times daily.  . [DISCONTINUED] insulin glargine, 2 Unit Dial, (TOUJEO MAX) 300 UNIT/ML Solostar Pen Inject 60 Units into the skin 2 (two) times daily.  . [DISCONTINUED] JANUVIA 100 MG tablet Take 1 tablet (100 mg total) by mouth daily.   No facility-administered encounter medications on file as of 07/18/2020.    ALLERGIES: Allergies  Allergen Reactions  . Gabapentin Itching    VACCINATION STATUS: Immunization History  Administered Date(s) Administered  . Influenza-Unspecified 06/09/2010, 06/09/2013    Diabetes She presents for her follow-up diabetic visit. She has type 2 diabetes mellitus. Onset time: She was diagnosed at approximate Cohen of 60 years. Her disease course has been improving. There are no hypoglycemic associated symptoms. Pertinent negatives for hypoglycemia include no confusion, headaches, pallor or seizures. Pertinent negatives for diabetes include no chest pain, no fatigue, no polydipsia, no polyphagia and no polyuria. There are no hypoglycemic complications. Symptoms are improving. There are no diabetic complications. Risk factors for coronary artery disease include diabetes mellitus, dyslipidemia, family history, obesity, tobacco exposure, sedentary lifestyle, post-menopausal and hypertension. Current diabetic treatment includes insulin injections (She is currently on Basaglar 60 units twice daily,  Metformin 1000 mg p.o. twice daily, could not afford Januvia any longer, she remains on glipizide 5 mg XL p.o. daily breakfast.  ). Her weight is fluctuating minimally. She is following a generally unhealthy diet. When asked about meal planning, she reported none. She has not had a previous visit with a dietitian. She rarely participates in exercise. Her home blood glucose trend is decreasing steadily. Her breakfast blood glucose range is generally 130-140 mg/dl. Her lunch blood glucose range is  generally 130-140 mg/dl. Her dinner blood glucose range is generally 130-140 mg/dl. Her bedtime blood glucose range is generally 130-140 mg/dl. Her overall blood glucose range is 130-140 mg/dl. (She presents with significant improvement in her glycemic profile averaging 180-200 mg/Dl.  Her point-of-care A1c is 8.6% improving from A1c of 13.2%.  She did not document or report hypoglycemia.  Her insurance is not providing adequate coverage for Januvia.  ) An ACE inhibitor/angiotensin II receptor blocker is being taken.  Hyperlipidemia This is a chronic problem.  The current episode started more than 1 year ago. The problem is controlled. Exacerbating diseases include diabetes and obesity. Pertinent negatives include no chest pain, myalgias or shortness of breath. Current antihyperlipidemic treatment includes statins. Risk factors for coronary artery disease include diabetes mellitus, dyslipidemia, family history, hypertension, obesity, a sedentary lifestyle and post-menopausal.  Hypertension This is a chronic problem. The current episode started more than 1 year ago. Pertinent negatives include no chest pain, headaches, palpitations or shortness of breath. Risk factors for coronary artery disease include diabetes mellitus, dyslipidemia, obesity, post-menopausal state, smoking/tobacco exposure, sedentary lifestyle and family history. Past treatments include ACE inhibitors.    Review of systems  Constitutional: + Minimally fluctuating body weight,  current  Body mass index is 44.4 kg/m. , no fatigue, no subjective hyperthermia, no subjective hypothermia    Objective:    BP (!) 150/84   Pulse 84   Ht 5\' 1"  (1.549 m)   Wt 235 lb (106.6 kg)   LMP 02/25/2019   BMI 44.40 kg/m   Wt Readings from Last 3 Encounters:  07/18/20 235 lb (106.6 kg)  04/26/20 230 lb 3.2 oz (104.4 kg)  03/27/20 227 lb 9.6 oz (103.2 kg)    Physical Exam- Limited  Constitutional:  Body mass index is 44.4 kg/m. , not in  acute distress, normal state of mind    CMP     Component Value Date/Time   NA 139 04/21/2019 0854   K 4.3 04/21/2019 0854   CL 103 04/21/2019 0854   CO2 23 04/21/2019 0854   GLUCOSE 295 (H) 04/21/2019 0854   BUN 15 04/21/2019 0854   CREATININE 0.68 04/21/2019 0854   CALCIUM 10.1 04/21/2019 0854   PROT 7.7 04/21/2019 0854   ALBUMIN 4.4 04/21/2019 0854   AST 27 04/21/2019 0854   ALT 26 04/21/2019 0854   ALKPHOS 67 04/21/2019 0854   BILITOT 0.6 04/21/2019 0854   GFRNONAA >60 04/21/2019 0854   GFRAA >60 04/21/2019 0854     Diabetic Labs (most recent): Lab Results  Component Value Date   HGBA1C 8.6 (A) 07/18/2020   HGBA1C 13.2 (A) 03/12/2020   HGBA1C 10.3 (A) 08/22/2019     Lipid Panel ( most recent) Lipid Panel     Component Value Date/Time   CHOL 134 04/21/2019 0855   TRIG 139 04/21/2019 0855   HDL 38 (L) 04/21/2019 0855   CHOLHDL 3.5 04/21/2019 0855   VLDL 28 04/21/2019 0855   LDLCALC 68 04/21/2019 0855      Lab Results  Component Value Date   TSH 2.110 01/16/2020   TSH 3.399 03/07/2013   FREET4 1.06 01/16/2020     Assessment & Plan:   1. Uncontrolled type 2 diabetes mellitus with hyperglycemia ([place])  - [name].  She presents with significant improvement in her glycemic profile averaging 180-200 mg/Dl.  Her point-of-care A1c is 8.6% improving from A1c of 13.2%.  She did not document or report hypoglycemia.  Her insurance is not providing adequate coverage for Januvia.  - I had a long discussion with her about the progressive nature of diabetes and the pathology behind its complications. -her diabetes is complicated by obesity, non- engagement for management plan,  history of smoking and she remains at a high risk for more acute and chronic complications which include CAD, CVA, CKD, retinopathy, and neuropathy. These are all discussed in detail with her.  - I have counseled her  on diet  and weight management  by adopting a carbohydrate restricted/protein rich diet. Patient is encouraged to switch to  unprocessed or minimally processed     complex starch and increased protein intake (animal or plant source), fruits, and vegetables. -  she is advised to stick to a routine mealtimes to eat 3 meals  a day and avoid unnecessary snacks ( to snack only to correct hypoglycemia).   - she acknowledges that there is a room for improvement in her food and drink choices. - Suggestion is made for her to avoid simple carbohydrates  from her diet including Cakes, Sweet Desserts, Ice Cream, Soda (diet and regular), Sweet Tea, Candies, Chips, Cookies, Store Bought Juices, Alcohol in Excess of  1-2 drinks a day, Artificial Sweeteners,  Coffee Creamer, and "Sugar-free" Products, Lemonade. This will help patient to have more stable blood glucose profile and potentially avoid unintended weight gain.  - she has been scheduled with Jearld Fenton, RDN, CDE for diabetes education.  - I have approached her with the following individualized plan to manage  her diabetes and patient agrees:    -Patient returns with continued improvement in her glycemic profile and engagement for proper monitoring.  She is advised to increase her Toujeo/Basaglar to 70 units twice daily, associated with monitoring of blood glucose at least twice a day-daily before breakfast and at bedtime.    - she is encouraged to call clinic for blood glucose levels less than 70 or above 300 mg /dl. - she is advised to continue Metformin 500 mg p.o. twice daily, glipizide 5 mg XL p.o. daily at breakfast.  She is not affording Januvia co-pay, advised to discontinue.    - Specific targets for  A1c;  LDL, HDL,  and Triglycerides were discussed with the patient.  2) Blood Pressure /Hypertension:   -Her blood pressure is controlled to near target.     she is advised to continue her current medications including lisinopril 40 mg p.o.  daily with breakfast .  3) Lipids/Hyperlipidemia:   Review of her recent lipid panel showed  controlled  LDL at 68 .  she  is advised to continue Crestor 20 mg p.o. nightly.    Side effects and precautions discussed with her.  4)  Weight/Diet: Her BMI is 43.50 -   clearly complicating her diabetes care.   she is  a candidate for modest weight loss. I discussed with her the fact that loss of 5 - 10% of her  current body weight will have the most impact on her diabetes management.  Exercise, and detailed carbohydrates information provided  -  detailed on discharge instructions.  5) Chronic Care/Health Maintenance:  -she  is on ACEI/ARB and Statin medications and  is encouraged to initiate and continue to follow up with Ophthalmology, Dentist,  Podiatrist at least yearly or according to recommendations, and advised to  stay away from smoking. I have recommended yearly flu vaccine and pneumonia vaccine at least every 5 years; moderate intensity exercise for up to 150 minutes weekly; and  sleep for at least 7 hours a day.  Her ABI was recently normal on April 26, 2020.  - she is  advised to maintain close follow up with Rosita Fire, MD for primary care needs, as well as her other providers for optimal and coordinated care.  - Time spent on this patient care encounter:  35 min, of which > 50% was spent in  counseling and the rest reviewing her blood glucose logs ,  discussing her hypoglycemia and hyperglycemia episodes, reviewing her current and  previous labs / studies  ( including abstraction from other facilities) and medications  doses and developing a  long term treatment plan and documenting her care.   Please refer to Patient Instructions for Blood Glucose Monitoring and Insulin/Medications Dosing Guide"  in media tab for additional information. Please  also refer to " Patient Self Inventory" in the Media  tab for reviewed elements of pertinent patient history.  [name] participated in  the discussions, expressed understanding, and voiced agreement with the above plans.  All questions were answered to her satisfaction. she is encouraged to contact clinic should she have any questions or concerns prior to her return visit.  Follow up plan: - Return in about 4 months (around 11/15/2020) for F/U with Pre-visit Labs, Meter, Logs, A1c here.Glade Lloyd, MD [facility] [address] [place], [place] [address] Phone: [phone]  Fax: [phone]    07/18/2020, 8:52 PM  This note was partially dictated with voice recognition software. Similar sounding words can be transcribed inadequately or may not  be corrected upon review.

## 2020-07-18 NOTE — Patient Instructions (Signed)
                                     Advice for Weight Management  -For most of us the best way to lose weight is by diet management. Generally speaking, diet management means consuming less calories intentionally which over time brings about progressive weight loss.  This can be achieved more effectively by restricting carbohydrate consumption to the minimum possible.  So, it is critically important to know your numbers: how much calorie you are consuming and how much calorie you need. More importantly, our carbohydrates sources should be unprocessed or minimally processed complex starch food items.   Sometimes, it is important to balance nutrition by increasing protein intake (animal or plant source), fruits, and vegetables.  -Sticking to a routine mealtime to eat 3 meals a day and avoiding unnecessary snacks is shown to have a big role in weight control. Under normal circumstances, the only time we lose real weight is when we are hungry, so allow hunger to take place- hunger means no food between meal times, only water.  It is not advisable to starve.   -It is better to avoid simple carbohydrates including: Cakes, Sweet Desserts, Ice Cream, Soda (diet and regular), Sweet Tea, Candies, Chips, Cookies, Store Bought Juices, Alcohol in Excess of  1-2 drinks a day, Lemonade,  Artificial Sweeteners, Doughnuts, Coffee Creamers, "Sugar-free" Products, etc, etc.  This is not a complete list.....    -Consulting with certified diabetes educators is proven to provide you with the most accurate and current information on diet.  Also, you may be  interested in discussing diet options/exchanges , we can schedule a visit with [name], RDN, CDE for individualized nutrition education.  -Exercise: If you are able: 30 -60 minutes a day ,4 days a week, or 150 minutes a week.  The longer the better.  Combine stretch, strength, and aerobic activities.  If you were told in the  past that you have high risk for cardiovascular diseases, you may seek evaluation by your heart doctor prior to initiating moderate to intense exercise programs.                                  Additional Care Considerations for Diabetes   -Diabetes  is a chronic disease.  The most important care consideration is regular follow-up with your diabetes care provider with the goal being avoiding or delaying its complications and to take advantage of advances in medications and technology.    -Type 2 diabetes is known to coexist with other important comorbidities such as high blood pressure and high cholesterol.  It is critical to control not only the diabetes but also the high blood pressure and high cholesterol to minimize and delay the risk of complications including coronary artery disease, stroke, amputations, blindness, etc.    - Studies showed that people with diabetes will benefit from a class of medications known as ACE inhibitors and statins.  Unless there are specific reasons not to be on these medications, the standard of care is to consider getting one from these groups of medications at an optimal doses.  These medications are generally considered safe and proven to help protect the heart and the kidneys.    - People with diabetes are encouraged to initiate and maintain regular follow-up with eye doctors, foot   doctors, dentists , and if necessary heart and kidney doctors.     - It is highly recommended that people with diabetes quit smoking or stay away from smoking, and get yearly  flu vaccine and pneumonia vaccine at least every 5 years.  One other important lifestyle recommendation is to ensure adequate sleep - at least 6-7 hours of uninterrupted sleep at night.  -Exercise: If you are able: 30 -60 minutes a day, 4 days a week, or 150 minutes a week.  The longer the better.  Combine stretch, strength, and aerobic activities.  If you were told in the past that you have high risk for  cardiovascular diseases, you may seek evaluation by your heart doctor prior to initiating moderate to intense exercise programs.          

## 2020-07-30 ENCOUNTER — Other Ambulatory Visit: Payer: Self-pay | Admitting: "Endocrinology

## 2020-07-31 DIAGNOSIS — J019 Acute sinusitis, unspecified: Secondary | ICD-10-CM | POA: Diagnosis not present

## 2020-07-31 DIAGNOSIS — I10 Essential (primary) hypertension: Secondary | ICD-10-CM | POA: Diagnosis not present

## 2020-07-31 DIAGNOSIS — E1142 Type 2 diabetes mellitus with diabetic polyneuropathy: Secondary | ICD-10-CM | POA: Diagnosis not present

## 2020-08-21 DIAGNOSIS — J343 Hypertrophy of nasal turbinates: Secondary | ICD-10-CM | POA: Diagnosis not present

## 2020-08-21 DIAGNOSIS — H6122 Impacted cerumen, left ear: Secondary | ICD-10-CM | POA: Diagnosis not present

## 2020-08-21 DIAGNOSIS — J310 Chronic rhinitis: Secondary | ICD-10-CM | POA: Diagnosis not present

## 2020-08-21 DIAGNOSIS — R0982 Postnasal drip: Secondary | ICD-10-CM | POA: Diagnosis not present

## 2020-08-22 ENCOUNTER — Other Ambulatory Visit: Payer: Self-pay | Admitting: "Endocrinology

## 2020-08-22 ENCOUNTER — Ambulatory Visit (HOSPITAL_COMMUNITY)
Admission: RE | Admit: 2020-08-22 | Discharge: 2020-08-22 | Disposition: A | Discharge status: Home / Self Care | Payer: BC Managed Care – PPO | Source: Ambulatory Visit | Attending: Gerontology | Admitting: Gerontology

## 2020-08-22 ENCOUNTER — Other Ambulatory Visit: Payer: Self-pay

## 2020-08-22 ENCOUNTER — Other Ambulatory Visit (HOSPITAL_COMMUNITY): Payer: Self-pay | Admitting: Gerontology

## 2020-08-22 ENCOUNTER — Other Ambulatory Visit (HOSPITAL_COMMUNITY): Payer: Self-pay | Admitting: Internal Medicine

## 2020-08-22 DIAGNOSIS — I700 Atherosclerosis of aorta: Secondary | ICD-10-CM | POA: Diagnosis not present

## 2020-08-22 DIAGNOSIS — Z1231 Encounter for screening mammogram for malignant neoplasm of breast: Secondary | ICD-10-CM

## 2020-08-22 DIAGNOSIS — R059 Cough, unspecified: Secondary | ICD-10-CM | POA: Diagnosis not present

## 2020-08-22 DIAGNOSIS — I10 Essential (primary) hypertension: Secondary | ICD-10-CM | POA: Diagnosis not present

## 2020-08-22 DIAGNOSIS — J45901 Unspecified asthma with (acute) exacerbation: Secondary | ICD-10-CM | POA: Diagnosis not present

## 2020-08-23 ENCOUNTER — Ambulatory Visit (HOSPITAL_COMMUNITY)
Admission: RE | Admit: 2020-08-23 | Discharge: 2020-08-23 | Disposition: A | Discharge status: Home / Self Care | Payer: BC Managed Care – PPO | Source: Ambulatory Visit | Attending: Internal Medicine | Admitting: Internal Medicine

## 2020-08-23 DIAGNOSIS — Z1231 Encounter for screening mammogram for malignant neoplasm of breast: Secondary | ICD-10-CM | POA: Insufficient documentation

## 2020-09-23 ENCOUNTER — Other Ambulatory Visit: Payer: Self-pay | Admitting: "Endocrinology

## 2020-10-03 DIAGNOSIS — J310 Chronic rhinitis: Secondary | ICD-10-CM | POA: Diagnosis not present

## 2020-10-03 DIAGNOSIS — J343 Hypertrophy of nasal turbinates: Secondary | ICD-10-CM | POA: Diagnosis not present

## 2020-10-09 DIAGNOSIS — I700 Atherosclerosis of aorta: Secondary | ICD-10-CM | POA: Diagnosis not present

## 2020-10-09 DIAGNOSIS — E1142 Type 2 diabetes mellitus with diabetic polyneuropathy: Secondary | ICD-10-CM | POA: Diagnosis not present

## 2020-10-09 DIAGNOSIS — I10 Essential (primary) hypertension: Secondary | ICD-10-CM | POA: Diagnosis not present

## 2020-10-15 ENCOUNTER — Other Ambulatory Visit: Payer: BC Managed Care – PPO | Admitting: Otolaryngology

## 2020-10-15 DIAGNOSIS — J329 Chronic sinusitis, unspecified: Secondary | ICD-10-CM

## 2020-10-20 ENCOUNTER — Other Ambulatory Visit: Payer: Self-pay

## 2020-10-20 ENCOUNTER — Ambulatory Visit
Admission: RE | Admit: 2020-10-20 | Discharge: 2020-10-20 | Disposition: A | Discharge status: Home / Self Care | Payer: BC Managed Care – PPO | Source: Ambulatory Visit | Attending: Otolaryngology | Admitting: Otolaryngology

## 2020-10-20 DIAGNOSIS — J329 Chronic sinusitis, unspecified: Secondary | ICD-10-CM

## 2020-10-20 DIAGNOSIS — Z9889 Other specified postprocedural states: Secondary | ICD-10-CM | POA: Diagnosis not present

## 2020-10-20 DIAGNOSIS — R42 Dizziness and giddiness: Secondary | ICD-10-CM | POA: Diagnosis not present

## 2020-10-20 DIAGNOSIS — J323 Chronic sphenoidal sinusitis: Secondary | ICD-10-CM | POA: Diagnosis not present

## 2020-10-20 DIAGNOSIS — J321 Chronic frontal sinusitis: Secondary | ICD-10-CM | POA: Diagnosis not present

## 2020-10-23 DIAGNOSIS — J310 Chronic rhinitis: Secondary | ICD-10-CM | POA: Diagnosis not present

## 2020-10-23 DIAGNOSIS — G9601 Cranial cerebrospinal fluid leak, spontaneous: Secondary | ICD-10-CM | POA: Diagnosis not present

## 2020-10-26 ENCOUNTER — Other Ambulatory Visit: Payer: Self-pay | Admitting: "Endocrinology

## 2020-10-28 ENCOUNTER — Other Ambulatory Visit: Payer: Self-pay | Admitting: "Endocrinology

## 2020-10-30 DIAGNOSIS — J342 Deviated nasal septum: Secondary | ICD-10-CM | POA: Diagnosis not present

## 2020-10-30 DIAGNOSIS — G9600 Cerebrospinal fluid leak, unspecified: Secondary | ICD-10-CM | POA: Insufficient documentation

## 2020-10-30 DIAGNOSIS — J3489 Other specified disorders of nose and nasal sinuses: Secondary | ICD-10-CM | POA: Diagnosis not present

## 2020-10-30 DIAGNOSIS — G9601 Cranial cerebrospinal fluid leak, spontaneous: Secondary | ICD-10-CM | POA: Diagnosis not present

## 2020-11-07 DIAGNOSIS — E1165 Type 2 diabetes mellitus with hyperglycemia: Secondary | ICD-10-CM | POA: Diagnosis not present

## 2020-11-08 LAB — COMPREHENSIVE METABOLIC PANEL
ALT: 18 IU/L (ref 0–32)
AST: 14 IU/L (ref 0–40)
Albumin/Globulin Ratio: 1.6 (ref 1.2–2.2)
Albumin: 4.2 g/dL (ref 3.8–4.9)
Alkaline Phosphatase: 88 IU/L (ref 44–121)
BUN/Creatinine Ratio: 21 (ref 12–28)
BUN: 15 mg/dL (ref 8–27)
Bilirubin Total: <0.2 mg/dL (ref 0.0–1.2)
CO2: 24 mmol/L (ref 20–29)
Calcium: 9.7 mg/dL (ref 8.7–10.3)
Chloride: 99 mmol/L (ref 96–106)
Creatinine, Ser: 0.7 mg/dL (ref 0.57–1.00)
Globulin, Total: 2.7 g/dL (ref 1.5–4.5)
Glucose: 264 mg/dL — ABNORMAL HIGH (ref 65–99)
Potassium: 4.5 mmol/L (ref 3.5–5.2)
Sodium: 138 mmol/L (ref 134–144)
Total Protein: 6.9 g/dL (ref 6.0–8.5)
eGFR: 99 mL/min/{1.73_m2} (ref >59)

## 2020-11-08 LAB — LIPID PANEL
Chol/HDL Ratio: 3.4 ratio (ref 0.0–4.4)
Cholesterol, Total: 128 mg/dL (ref 100–199)
HDL: 38 mg/dL — ABNORMAL LOW (ref >39)
LDL Chol Calc (NIH): 68 mg/dL (ref 0–99)
Triglycerides: 120 mg/dL (ref 0–149)
VLDL Cholesterol Cal: 22 mg/dL (ref 5–40)

## 2020-11-08 LAB — TSH: TSH: 1.21 u[IU]/mL (ref 0.450–4.500)

## 2020-11-08 LAB — T4, FREE: Free T4: 1.13 ng/dL (ref 0.82–1.77)

## 2020-11-14 ENCOUNTER — Ambulatory Visit (INDEPENDENT_AMBULATORY_CARE_PROVIDER_SITE_OTHER): Payer: BC Managed Care – PPO | Admitting: "Endocrinology

## 2020-11-14 ENCOUNTER — Other Ambulatory Visit: Payer: Self-pay

## 2020-11-14 ENCOUNTER — Encounter: Payer: Self-pay | Admitting: "Endocrinology

## 2020-11-14 VITALS — BP 164/98 | HR 88 | Ht 61.0 in | Wt 235.0 lb

## 2020-11-14 DIAGNOSIS — E782 Mixed hyperlipidemia: Secondary | ICD-10-CM | POA: Diagnosis not present

## 2020-11-14 DIAGNOSIS — I10 Essential (primary) hypertension: Secondary | ICD-10-CM

## 2020-11-14 DIAGNOSIS — E1165 Type 2 diabetes mellitus with hyperglycemia: Secondary | ICD-10-CM

## 2020-11-14 LAB — POCT GLYCOSYLATED HEMOGLOBIN (HGB A1C): HbA1c, POC (controlled diabetic range): 12.1 % — AB (ref 0.0–7.0)

## 2020-11-14 MED ORDER — TOUJEO MAX SOLOSTAR 300 UNIT/ML ~~LOC~~ SOPN: 80.0000 [IU] | PEN_INJECTOR | Freq: Two times a day (BID) | SUBCUTANEOUS | 2 refills | Status: DC

## 2020-11-14 NOTE — Progress Notes (Signed)
11/14/2020, 4:58 PM   Endocrinology follow-up note   Subjective:    Patient ID: [name], female    DOB: [birth year].  [name] is being seen in follow-up in the management of her currently uncontrolled type 2 diabetes, hypertension. QIW:LNLGX, Brandon Melnick, MD.   Past Medical History:  Diagnosis Date  . Allergy   . Asthma   . COPD (chronic obstructive pulmonary disease) ([place])   . Diabetes mellitus without complication ([place])   . Hypertension   . Morbid obesity due to excess calories ([place])   . Umbilical hernia     Past Surgical History:  Procedure Laterality Date  . CARPAL TUNNEL RELEASE Right   . CHOLECYSTECTOMY    . COLONOSCOPY N/A 01/12/2015   Procedure: COLONOSCOPY;  Surgeon: Danie Binder, MD;  Location: [facility];  Service: Endoscopy;  Laterality: N/A;  0830  . HYSTEROSCOPY WITH D & C N/A 04/01/2013   Procedure: DILATATION AND CURETTAGE /HYSTEROSCOPY;  Surgeon: Florian Buff, MD;  Location: [facility]S;  Service: Gynecology;  Laterality: N/A;  . NASAL POLYP SURGERY      Social History   Socioeconomic History  . Marital status: Married    Spouse name: Not on file  . Number of children: 2  . Years of education: Not on file  . Highest education level: Not on file  Occupational History  . Not on file  Tobacco Use  . Smoking status: Former Smoker    Packs/day: 0.50    Years: 20.00    Pack years: 10.00    Types: Cigarettes    Quit date: 03/25/1997    Years since quitting: 23.6  . Smokeless tobacco: Never Used  Vaping Use  . Vaping Use: Never used  Substance and Sexual Activity  . Alcohol use: Yes    Alcohol/week: 0.0 standard drinks    Comment: Ocassional  . Drug use: No  . Sexual activity: Yes    Birth control/protection: None, Post-menopausal  Other Topics Concern  . Not on file  Social History Narrative  . Not on file   Social Determinants of Health   Financial Resource  Strain: Not on file  Food Insecurity: Not on file  Transportation Needs: Not on file  Physical Activity: Not on file  Stress: Not on file  Social Connections: Not on file    Family History  Problem Relation Age of Onset  . Diabetes Mother   . Kidney disease Mother   . Other Mother        renal failure  . Hypertension Mother   . Cancer Mother        ?uterine cancer   . Diabetes Father   . Cancer Father        pancreatic  . Diabetes Sister   . Kidney disease Sister   . Diabetes Maternal Grandmother   . Asthma Maternal Grandmother   . COPD Maternal Grandmother        bronchitis  . Hypertension Maternal Grandmother   . Thyroid disease Maternal Grandmother   . Hypertension Daughter   . Colon cancer Neg Hx     Outpatient Encounter Medications as of 11/14/2020  Medication Sig  .  losartan-hydrochlorothiazide (HYZAAR) 100-25 MG tablet Take 1 tablet by mouth daily.  Marland Kitchen amLODipine (NORVASC) 5 MG tablet Take 1 tablet by mouth daily.  Marland Kitchen aspirin EC 81 MG tablet Take 81 mg by mouth daily.  . B-D UF III MINI PEN NEEDLES 31G X 5 MM MISC USE ONCE DAILY  . diclofenac sodium (VOLTAREN) 1 % GEL 4 (four) times daily as needed.  . fluticasone (FLONASE) 50 MCG/ACT nasal spray Place 2 sprays into both nostrils daily.  . Fluticasone-Salmeterol (ADVAIR) 250-50 MCG/DOSE AEPB Inhale 1 puff into the lungs 2 (two) times daily.   Marland Kitchen glipiZIDE (GLUCOTROL XL) 5 MG 24 hr tablet TAKE 1 TABLET BY MOUTH EVERY DAY WITH BREAKFAST  . insulin glargine, 2 Unit Dial, (TOUJEO MAX SOLOSTAR) 300 UNIT/ML Solostar Pen Inject 80 Units into the skin 2 (two) times daily.  Marland Kitchen ipratropium-albuterol (DUONEB) 0.5-2.5 (3) MG/3ML SOLN Take 3 mLs by nebulization every 4 (four) hours as needed (Shortness of Breath).   . loratadine (CLARITIN) 10 MG tablet Take 10 mg by mouth daily.  . metFORMIN (GLUCOPHAGE) 500 MG tablet TAKE 1 TABLET BY MOUTH 2 TIMES DAILY WITH A MEAL.  . Multiple Vitamin (MULTIVITAMIN WITH MINERALS) TABS tablet Take  1 tablet by mouth daily.  . rosuvastatin (CRESTOR) 20 MG tablet Take 20 mg by mouth daily.   . traMADol (ULTRAM) 50 MG tablet Take 50 mg by mouth 2 (two) times daily.  . [DISCONTINUED] insulin glargine, 2 Unit Dial, (TOUJEO MAX SOLOSTAR) 300 UNIT/ML Solostar Pen Inject 70 Units into the skin 2 (two) times daily.  . [DISCONTINUED] lisinopril (PRINIVIL,ZESTRIL) 40 MG tablet Take 40 mg by mouth daily. (Patient not taking: Reported on 11/14/2020)   No facility-administered encounter medications on file as of 11/14/2020.    ALLERGIES: No Active Allergies  VACCINATION STATUS: Immunization History  Administered Date(s) Administered  . Influenza-Unspecified 06/09/2010, 06/09/2013    Diabetes She presents for her follow-up diabetic visit. She has type 2 diabetes mellitus. Onset time: She was diagnosed at approximate age of 21 years. Her disease course has been worsening. There are no hypoglycemic associated symptoms. Pertinent negatives for hypoglycemia include no confusion, headaches, pallor or seizures. Pertinent negatives for diabetes include no chest pain, no fatigue, no polydipsia, no polyphagia and no polyuria. There are no hypoglycemic complications. Symptoms are worsening. There are no diabetic complications. Risk factors for coronary artery disease include diabetes mellitus, dyslipidemia, family history, obesity, tobacco exposure, sedentary lifestyle, post-menopausal and hypertension. Current diabetic treatment includes insulin injections (She is currently on Basaglar 60 units twice daily,  Metformin 1000 mg p.o. twice daily, could not afford Januvia any longer, she remains on glipizide 5 mg XL p.o. daily breakfast.  ). Her weight is fluctuating minimally. She is following a generally unhealthy diet. When asked about meal planning, she reported none. She has not had a previous visit with a dietitian. She rarely participates in exercise. (She presents with no meter nor logs to review.  Her  point-of-care A1c was 12.1% worsening from 8.6% during her last visit.  ) An ACE inhibitor/angiotensin II receptor blocker is being taken.  Hyperlipidemia This is a chronic problem. The current episode started more than 1 year ago. The problem is controlled. Exacerbating diseases include diabetes and obesity. Pertinent negatives include no chest pain, myalgias or shortness of breath. Current antihyperlipidemic treatment includes statins. Risk factors for coronary artery disease include diabetes mellitus, dyslipidemia, family history, hypertension, obesity, a sedentary lifestyle and post-menopausal.  Hypertension This is a chronic problem. The current  episode started more than 1 year ago. Pertinent negatives include no chest pain, headaches, palpitations or shortness of breath. Risk factors for coronary artery disease include diabetes mellitus, dyslipidemia, obesity, post-menopausal state, smoking/tobacco exposure, sedentary lifestyle and family history. Past treatments include ACE inhibitors.    Review of systems  Constitutional: + Minimally fluctuating body weight,  current  Body mass index is 44.4 kg/m. , no fatigue, no subjective hyperthermia, no subjective hypothermia    Objective:    BP (!) 164/98   Pulse 88   Ht 5\' 1"  (1.549 m)   Wt 235 lb (106.6 kg)   LMP 02/25/2019   BMI 44.40 kg/m   Wt Readings from Last 3 Encounters:  11/14/20 235 lb (106.6 kg)  07/18/20 235 lb (106.6 kg)  04/26/20 230 lb 3.2 oz (104.4 kg)    Physical Exam- Limited  Constitutional:  Body mass index is 44.4 kg/m. , not in acute distress, normal state of mind    CMP     Component Value Date/Time   NA 138 11/07/2020 0735   K 4.5 11/07/2020 0735   CL 99 11/07/2020 0735   CO2 24 11/07/2020 0735   GLUCOSE 264 (H) 11/07/2020 0735   GLUCOSE 295 (H) 04/21/2019 0854   BUN 15 11/07/2020 0735   CREATININE 0.70 11/07/2020 0735   CALCIUM 9.7 11/07/2020 0735   PROT 6.9 11/07/2020 0735   ALBUMIN 4.2  11/07/2020 0735   AST 14 11/07/2020 0735   ALT 18 11/07/2020 0735   ALKPHOS 88 11/07/2020 0735   BILITOT <0.2 11/07/2020 0735   GFRNONAA >60 04/21/2019 0854   GFRAA >60 04/21/2019 0854     Diabetic Labs (most recent): Lab Results  Component Value Date   HGBA1C 12.1 (A) 11/14/2020   HGBA1C 8.6 (A) 07/18/2020   HGBA1C 13.2 (A) 03/12/2020     Lipid Panel ( most recent) Lipid Panel     Component Value Date/Time   CHOL 128 11/07/2020 0735   TRIG 120 11/07/2020 0735   HDL 38 (L) 11/07/2020 0735   CHOLHDL 3.4 11/07/2020 0735   CHOLHDL 3.5 04/21/2019 0855   VLDL 28 04/21/2019 0855   LDLCALC 68 11/07/2020 0735   LABVLDL 22 11/07/2020 0735      Lab Results  Component Value Date   TSH 1.210 11/07/2020   TSH 2.110 01/16/2020   TSH 3.399 03/07/2013   FREET4 1.13 11/07/2020   FREET4 1.06 01/16/2020     Assessment & Plan:   1. Uncontrolled type 2 diabetes mellitus with hyperglycemia ([place])  - Earla L Crager has currently uncontrolled symptomatic type 2 DM since 60 years of age.  She presents with no meter nor logs to review.  Her point-of-care A1c was 12.1% worsening from 8.6% during her last visit.    - I had a long discussion with her about the progressive nature of diabetes and the pathology behind its complications. -her diabetes is complicated by obesity, non- engagement for management plan,  history of smoking and she remains at a high risk for more acute and chronic complications which include CAD, CVA, CKD, retinopathy, and neuropathy. These are all discussed in detail with her.  - I have counseled her on diet  and weight management  by adopting a carbohydrate restricted/protein rich diet. Patient is encouraged to switch to  unprocessed or minimally processed     complex starch and increased protein intake (animal or plant source), fruits, and vegetables. -  she is advised to stick to a routine mealtimes to  eat 3 meals  a day and avoid unnecessary snacks ( to snack only  to correct hypoglycemia).   - she acknowledges that there is a room for improvement in her food and drink choices. - Suggestion is made for her to avoid simple carbohydrates  from her diet including Cakes, Sweet Desserts, Ice Cream, Soda (diet and regular), Sweet Tea, Candies, Chips, Cookies, Store Bought Juices, Alcohol in Excess of  1-2 drinks a day, Artificial Sweeteners,  Coffee Creamer, and "Sugar-free" Products, Lemonade. This will help patient to have more stable blood glucose profile and potentially avoid unintended weight gain.   - she has been scheduled with Jearld Fenton, RDN, CDE for diabetes education.  - I have approached her with the following individualized plan to manage  her diabetes and patient agrees:    -In light of her presentation with loss of control, she is approached to resume insulin treatment and engage better for monitoring blood glucose regularly.    She is advised to  Resume Toujeo 80 units nightly , start monitoring blood glucose 4 times a day-before meals and at bedtime and return in 1 week for reevaluation.    - she is encouraged to call clinic for blood glucose levels less than 70 or above 200 mg /dl. - she is advised to continue Metformin 500 mg p.o. twice daily, glipizide 5 mg XL p.o. daily at breakfast.  She is not affording Januvia co-pay, advised to discontinue.    - Specific targets for  A1c;  LDL, HDL,  and Triglycerides were discussed with the patient.  2) Blood Pressure /Hypertension:   -Her blood pressure is not controlled to target.  Amlodipine 5 mg was recently added to her Hyzaar.  She is advised to continue Biotin 5 mg p.o. daily, and losartan/HCTZ 100/25 milligrams p.o. daily.   3) Lipids/Hyperlipidemia:   Review of her recent lipid panel showed  controlled  LDL at 68 .  she  is advised to continue Crestor 20 mg p.o. nightly.     Side effects and precautions discussed with her.  4)  Weight/Diet: Her BMI is 44.4 -   clearly complicating her  diabetes care.   she is  a candidate for modest weight loss. I discussed with her the fact that loss of 5 - 10% of her  current body weight will have the most impact on her diabetes management.  Exercise, and detailed carbohydrates information provided  -  detailed on discharge instructions.  5) Chronic Care/Health Maintenance:  -she  is on ACEI/ARB and Statin medications and  is encouraged to initiate and continue to follow up with Ophthalmology, Dentist,  Podiatrist at least yearly or according to recommendations, and advised to  stay away from smoking. I have recommended yearly flu vaccine and pneumonia vaccine at least every 5 years; moderate intensity exercise for up to 150 minutes weekly; and  sleep for at least 7 hours a day.  Her ABI was recently normal on April 26, 2020.  - she is  advised to maintain close follow up with Rosita Fire, MD for primary care needs, as well as her other providers for optimal and coordinated care.   I spent 42 minutes in the care of the patient today including review of labs from [facility], Lipids, Thyroid Function, Hematology (current and previous including abstractions from other facilities); face-to-face time discussing  her blood glucose readings/logs, discussing hypoglycemia and hyperglycemia episodes and symptoms, medications doses, her options of short and long term treatment based on the  latest standards of care / guidelines;  discussion about incorporating lifestyle medicine;  and documenting the encounter.    Please refer to Patient Instructions for Blood Glucose Monitoring and Insulin/Medications Dosing Guide"  in media tab for additional information. Please  also refer to " Patient Self Inventory" in the Media  tab for reviewed elements of pertinent patient history.  Mackinley L Durfee participated in the discussions, expressed understanding, and voiced agreement with the above plans.  All questions were answered to her satisfaction. she is encouraged to  contact clinic should she have any questions or concerns prior to her return visit.   Follow up plan: - Return in about 1 week (around 11/21/2020) for F/U with Meter and Logs Only - no Labs.  Glade Lloyd, MD [facility] [address] [place], [place] [address] Phone: [phone]  Fax: [phone]    11/14/2020, 4:58 PM  This note was partially dictated with voice recognition software. Similar sounding words can be transcribed inadequately or may not  be corrected upon review.

## 2020-11-14 NOTE — Patient Instructions (Signed)

## 2020-11-15 ENCOUNTER — Ambulatory Visit: Payer: BC Managed Care – PPO | Admitting: "Endocrinology

## 2020-11-22 ENCOUNTER — Other Ambulatory Visit: Payer: Self-pay

## 2020-11-22 ENCOUNTER — Encounter: Payer: Self-pay | Admitting: "Endocrinology

## 2020-11-22 ENCOUNTER — Ambulatory Visit (INDEPENDENT_AMBULATORY_CARE_PROVIDER_SITE_OTHER): Payer: BC Managed Care – PPO | Admitting: "Endocrinology

## 2020-11-22 VITALS — BP 135/77 | HR 85 | Ht 61.0 in | Wt 235.2 lb

## 2020-11-22 DIAGNOSIS — E782 Mixed hyperlipidemia: Secondary | ICD-10-CM | POA: Diagnosis not present

## 2020-11-22 DIAGNOSIS — I10 Essential (primary) hypertension: Secondary | ICD-10-CM

## 2020-11-22 DIAGNOSIS — E1165 Type 2 diabetes mellitus with hyperglycemia: Secondary | ICD-10-CM

## 2020-11-22 MED ORDER — TOUJEO MAX SOLOSTAR 300 UNIT/ML ~~LOC~~ SOPN: 100.0000 [IU] | PEN_INJECTOR | Freq: Every day | SUBCUTANEOUS | 2 refills | Status: DC

## 2020-11-22 NOTE — Patient Instructions (Signed)

## 2020-11-22 NOTE — Progress Notes (Signed)
11/22/2020, 6:04 PM   Endocrinology follow-up note   Subjective:    Patient ID: [name], female    DOB: [birth year].  [name] is being seen in follow-up in the management of her currently uncontrolled type 2 diabetes, hypertension. [id number], Tesfaye, MD.   Past Medical History:  Diagnosis Date   Allergy    Asthma    COPD (chronic obstructive pulmonary disease) ([place])    Diabetes mellitus without complication ([place])    Hypertension    Morbid obesity due to excess calories ([place])    Umbilical hernia     Past Surgical History:  Procedure Laterality Date   CARPAL TUNNEL RELEASE Right    CHOLECYSTECTOMY     COLONOSCOPY N/A 01/12/2015   Procedure: COLONOSCOPY;  Surgeon: Danie Binder, MD;  Location[facility];  Service: Endoscopy;  Laterality: N/A;  0830   HYSTEROSCOPY WITH D & C N/A 04/01/2013   Procedure: DILATATION AND CURETTAGE /HYSTEROSCOPY;  Surgeon: Florian Buff, MD;  Location: [facility];  Service: Gynecology;  Laterality: N/A;   NASAL POLYP SURGERY      Social History   Socioeconomic History   Marital status: Married    Spouse name: Not on file   Number of children: 2   Years of education: Not on file   Highest education level: Not on file  Occupational History   Not on file  Tobacco Use   Smoking status: Former    Packs/day: 0.50    Years: 20.00    Pack years: 10.00    Types: Cigarettes    Quit date: 03/25/1997    Years since quitting: 23.6   Smokeless tobacco: Never  Vaping Use   Vaping Use: Never used  Substance and Sexual Activity   Alcohol use: Yes    Alcohol/week: 0.0 standard drinks    Comment: Ocassional   Drug use: No   Sexual activity: Yes    Birth control/protection: None, Post-menopausal  Other Topics Concern   Not on file  Social History Narrative   Not on file   Social Determinants of Health   Financial Resource Strain: Not on file  Food  Insecurity: Not on file  Transportation Needs: Not on file  Physical Activity: Not on file  Stress: Not on file  Social Connections: Not on file    Family History  Problem Relation Age of Onset   Diabetes Mother    Kidney disease Mother    Other Mother        renal failure   Hypertension Mother    Cancer Mother        ?uterine cancer    Diabetes Father    Cancer Father        pancreatic   Diabetes Sister    Kidney disease Sister    Diabetes Maternal Grandmother    Asthma Maternal Grandmother    COPD Maternal Grandmother        bronchitis   Hypertension Maternal Grandmother    Thyroid disease Maternal Grandmother    Hypertension Daughter    Colon cancer Neg Hx     Outpatient Encounter Medications as of 11/22/2020  Medication Sig   amLODipine (  NORVASC) 5 MG tablet Take 1 tablet by mouth daily.   aspirin EC 81 MG tablet Take 81 mg by mouth daily.   B-D UF III MINI PEN NEEDLES 31G X 5 MM MISC USE ONCE DAILY   diclofenac sodium (VOLTAREN) 1 % GEL 4 (four) times daily as needed.   fluticasone (FLONASE) 50 MCG/ACT nasal spray Place 2 sprays into both nostrils daily.   Fluticasone-Salmeterol (ADVAIR) 250-50 MCG/DOSE AEPB Inhale 1 puff into the lungs 2 (two) times daily.    gabapentin (NEURONTIN) 100 MG capsule Take 1 capsule by mouth 2 (two) times daily.   glipiZIDE (GLUCOTROL XL) 5 MG 24 hr tablet TAKE 1 TABLET BY MOUTH EVERY DAY WITH BREAKFAST   insulin glargine, 2 Unit Dial, (TOUJEO MAX SOLOSTAR) 300 UNIT/ML Solostar Pen Inject 100 Units into the skin at bedtime.   ipratropium-albuterol (DUONEB) 0.5-2.5 (3) MG/3ML SOLN Take 3 mLs by nebulization every 4 (four) hours as needed (Shortness of Breath).    loratadine (CLARITIN) 10 MG tablet Take 10 mg by mouth daily.   losartan-hydrochlorothiazide (HYZAAR) 100-25 MG tablet Take 1 tablet by mouth daily.   metFORMIN (GLUCOPHAGE) 500 MG tablet TAKE 1 TABLET BY MOUTH 2 TIMES DAILY WITH A MEAL.   Multiple Vitamin (MULTIVITAMIN WITH  MINERALS) TABS tablet Take 1 tablet by mouth daily.   rosuvastatin (CRESTOR) 20 MG tablet Take 20 mg by mouth daily.    traMADol (ULTRAM) 50 MG tablet Take 50 mg by mouth 2 (two) times daily.   [DISCONTINUED] insulin glargine, 2 Unit Dial, (TOUJEO MAX SOLOSTAR) 300 UNIT/ML Solostar Pen Inject 80 Units into the skin 2 (two) times daily. (Patient taking differently: Inject 100 Units into the skin at bedtime.)   No facility-administered encounter medications on file as of 11/22/2020.    ALLERGIES: No Known Allergies  VACCINATION STATUS: Immunization History  Administered Date(s) Administered   Influenza-Unspecified 06/09/2010, 06/09/2013    Diabetes She presents for her follow-up diabetic visit. She has type 2 diabetes mellitus. Onset time: She was diagnosed at approximate age of 65 years. Her disease course has been improving. There are no hypoglycemic associated symptoms. Pertinent negatives for hypoglycemia include no confusion, headaches, pallor or seizures. Pertinent negatives for diabetes include no chest pain, no fatigue, no polydipsia, no polyphagia and no polyuria. There are no hypoglycemic complications. Symptoms are improving. There are no diabetic complications. Risk factors for coronary artery disease include diabetes mellitus, dyslipidemia, family history, obesity, tobacco exposure, sedentary lifestyle, post-menopausal and hypertension. Current diabetic treatment includes insulin injections (She is currently on Basaglar 60 units twice daily,  Metformin 1000 mg p.o. twice daily, could not afford Januvia any longer, she remains on glipizide 5 mg XL p.o. daily breakfast.  ). Her weight is fluctuating minimally. She is following a generally unhealthy diet. When asked about meal planning, she reported none. She has not had a previous visit with a dietitian. She rarely participates in exercise. Her home blood glucose trend is decreasing steadily. Her breakfast blood glucose range is generally  140-180 mg/dl. Her lunch blood glucose range is generally 140-180 mg/dl. Her dinner blood glucose range is generally 140-180 mg/dl. Her bedtime blood glucose range is generally 140-180 mg/dl. Her overall blood glucose range is 140-180 mg/dl. (She  presents with improving glycemic profile told near target levels using a higher dose of Toujeo.  Her recent point-of-care A1c was worsening to 12.1% from 8.6%.  She did not document hypoglycemia. ) An ACE inhibitor/angiotensin II receptor blocker is being taken.  Hyperlipidemia This  is a chronic problem. The current episode started more than 1 year ago. The problem is controlled. Exacerbating diseases include diabetes and obesity. Pertinent negatives include no chest pain, myalgias or shortness of breath. Current antihyperlipidemic treatment includes statins. Risk factors for coronary artery disease include diabetes mellitus, dyslipidemia, family history, hypertension, obesity, a sedentary lifestyle and post-menopausal.  Hypertension This is a chronic problem. The current episode started more than 1 year ago. Pertinent negatives include no chest pain, headaches, palpitations or shortness of breath. Risk factors for coronary artery disease include diabetes mellitus, dyslipidemia, obesity, post-menopausal state, smoking/tobacco exposure, sedentary lifestyle and family history. Past treatments include ACE inhibitors.   Review of systems  Constitutional: + Minimally fluctuating body weight,  current  Body mass index is 44.44 kg/m. , no fatigue, no subjective hyperthermia, no subjective hypothermia    Objective:    BP 135/77   Pulse 85   Ht 5\' 1"  (1.549 m)   Wt 235 lb 3.2 oz (106.7 kg)   LMP 02/25/2019   BMI 44.44 kg/m   Wt Readings from Last 3 Encounters:  11/22/20 235 lb 3.2 oz (106.7 kg)  11/14/20 235 lb (106.6 kg)  07/18/20 235 lb (106.6 kg)    Physical Exam- Limited  Constitutional:  Body mass index is 44.44 kg/m. , not in acute distress,  normal state of mind    CMP     Component Value Date/Time   NA 138 11/07/2020 0735   K 4.5 11/07/2020 0735   CL 99 11/07/2020 0735   CO2 24 11/07/2020 0735   GLUCOSE 264 (H) 11/07/2020 0735   GLUCOSE 295 (H) 04/21/2019 0854   BUN 15 11/07/2020 0735   CREATININE 0.70 11/07/2020 0735   CALCIUM 9.7 11/07/2020 0735   PROT 6.9 11/07/2020 0735   ALBUMIN 4.2 11/07/2020 0735   AST 14 11/07/2020 0735   ALT 18 11/07/2020 0735   ALKPHOS 88 11/07/2020 0735   BILITOT <0.2 11/07/2020 0735   GFRNONAA >60 04/21/2019 0854   GFRAA >60 04/21/2019 0854     Diabetic Labs (most recent): Lab Results  Component Value Date   HGBA1C 12.1 (A) 11/14/2020   HGBA1C 8.6 (A) 07/18/2020   HGBA1C 13.2 (A) 03/12/2020     Lipid Panel ( most recent) Lipid Panel     Component Value Date/Time   CHOL 128 11/07/2020 0735   TRIG 120 11/07/2020 0735   HDL 38 (L) 11/07/2020 0735   CHOLHDL 3.4 11/07/2020 0735   CHOLHDL 3.5 04/21/2019 0855   VLDL 28 04/21/2019 0855   LDLCALC 68 11/07/2020 0735   LABVLDL 22 11/07/2020 0735      Lab Results  Component Value Date   TSH 1.210 11/07/2020   TSH 2.110 01/16/2020   TSH 3.399 03/07/2013   FREET4 1.13 11/07/2020   FREET4 1.06 01/16/2020     Assessment & Plan:   1. Uncontrolled type 2 diabetes mellitus with hyperglycemia ([place])  - [name] has currently uncontrolled symptomatic type 2 DM since 60 years of age.  SShe  presents with improving glycemic profile told near target levels using a higher dose of Toujeo.  Her recent point-of-care A1c was worsening to 12.1% from 8.6%.  She did not document hypoglycemia.   - I had a long discussion with her about the progressive nature of diabetes and the pathology behind its complications. -her diabetes is complicated by obesity, non- engagement for management plan,  history of smoking and she remains at a high risk for more acute and  chronic complications which include CAD, CVA, CKD, retinopathy, and  neuropathy. These are all discussed in detail with her.  - I have counseled her on diet  and weight management  by adopting a carbohydrate restricted/protein rich diet. Patient is encouraged to switch to  unprocessed or minimally processed     complex starch and increased protein intake (animal or plant source), fruits, and vegetables. -  she is advised to stick to a routine mealtimes to eat 3 meals  a day and avoid unnecessary snacks ( to snack only to correct hypoglycemia).  - she acknowledges that there is a room for improvement in her food and drink choices. - Suggestion is made for her to avoid simple carbohydrates  from her diet including Cakes, Sweet Desserts, Ice Cream, Soda (diet and regular), Sweet Tea, Candies, Chips, Cookies, Store Bought Juices, Alcohol in Excess of  1-2 drinks a day, Artificial Sweeteners,  Coffee Creamer, and "Sugar-free" Products, Lemonade. This will help patient to have more stable blood glucose profile and potentially avoid unintended weight gain.   - she has been scheduled with Jearld Fenton, RDN, CDE for diabetes education.  - I have approached her with the following individualized plan to manage  her diabetes and patient agrees:    -In light of her presentation with improved glycemic profile she will not need prandial insulin for now. She is advised to continue Toujeo at 100 units nightly, continue to monitor blood glucose 4 times daily-before meals and at bedtime.    - she is encouraged to call clinic for blood glucose levels less than 70 or above 200 mg /dl. - she is advised to continue Metformin 500 mg p.o. twice daily, glipizide 5 mg XL p.o. daily at breakfast.  She is not affording Januvia co-pay, advised to discontinue.    - Specific targets for  A1c;  LDL, HDL,  and Triglycerides were discussed with the patient.  2) Blood Pressure /Hypertension:   -Her blood pressure is controlled to target.  Amlodipine 5 mg was recently added to her Hyzaar.  She is  advised to continue Biotin 5 mg p.o. daily, and losartan/HCTZ 100/25 milligrams p.o. daily.   3) Lipids/Hyperlipidemia:   Review of her recent lipid panel showed  controlled  LDL at 68 .  she  is advised to continue Crestor 20 mg p.o. nightly.     Side effects and precautions discussed with her.  4)  Weight/Diet: Her BMI is 44.4 -   clearly complicating her diabetes care.   she is  a candidate for modest weight loss. I discussed with her the fact that loss of 5 - 10% of her  current body weight will have the most impact on her diabetes management.  Exercise, and detailed carbohydrates information provided  -  detailed on discharge instructions.  5) Chronic Care/Health Maintenance:  -she  is on ACEI/ARB and Statin medications and  is encouraged to initiate and continue to follow up with Ophthalmology, Dentist,  Podiatrist at least yearly or according to recommendations, and advised to  stay away from smoking. I have recommended yearly flu vaccine and pneumonia vaccine at least every 5 years; moderate intensity exercise for up to 150 minutes weekly; and  sleep for at least 7 hours a day.  Her ABI was recently normal on April 26, 2020.  - she is  advised to maintain close follow up with Rosita Fire, MD for primary care needs, as well as her other providers for optimal and coordinated care.  I spent 31 minutes in the care of the patient today including review of labs from [facility], Lipids, Thyroid Function, Hematology (current and previous including abstractions from other facilities); face-to-face time discussing  her blood glucose readings/logs, discussing hypoglycemia and hyperglycemia episodes and symptoms, medications doses, her options of short and long term treatment based on the latest standards of care / guidelines;  discussion about incorporating lifestyle medicine;  and documenting the encounter.    Please refer to Patient Instructions for Blood Glucose Monitoring and  Insulin/Medications Dosing Guide"  in media tab for additional information. Please  also refer to " Patient Self Inventory" in the Media  tab for reviewed elements of pertinent patient history.  Gwendalyn L Soman participated in the discussions, expressed understanding, and voiced agreement with the above plans.  All questions were answered to her satisfaction. she is encouraged to contact clinic should she have any questions or concerns prior to her return visit.    Follow up plan: - Return in about 3 months (around 02/22/2021) for Bring Meter and Logs- A1c in Office.  Glade Lloyd, MD [facility] [address] [place], [place] [address] Phone: [phone]  Fax: [phone]    11/22/2020, 6:04 PM  This note was partially dictated with voice recognition software. Similar sounding words can be transcribed inadequately or may not  be corrected upon review.

## 2020-11-27 ENCOUNTER — Other Ambulatory Visit: Payer: Self-pay | Admitting: "Endocrinology

## 2020-12-19 ENCOUNTER — Other Ambulatory Visit: Payer: Self-pay | Admitting: "Endocrinology

## 2020-12-28 ENCOUNTER — Encounter (HOSPITAL_COMMUNITY): Payer: Self-pay | Admitting: *Deleted

## 2020-12-28 ENCOUNTER — Other Ambulatory Visit: Payer: Self-pay

## 2020-12-28 ENCOUNTER — Emergency Department (HOSPITAL_COMMUNITY)
Admission: EM | Admit: 2020-12-28 | Discharge: 2020-12-28 | Disposition: A | Discharge status: Home / Self Care | Payer: BC Managed Care – PPO | Attending: Emergency Medicine | Admitting: Emergency Medicine

## 2020-12-28 ENCOUNTER — Encounter: Payer: Self-pay | Admitting: Emergency Medicine

## 2020-12-28 ENCOUNTER — Ambulatory Visit (INDEPENDENT_AMBULATORY_CARE_PROVIDER_SITE_OTHER)
Admission: EM | Admit: 2020-12-28 | Discharge: 2020-12-28 | Disposition: A | Discharge status: Home / Self Care | Payer: BC Managed Care – PPO | Source: Home / Self Care

## 2020-12-28 DIAGNOSIS — Z794 Long term (current) use of insulin: Secondary | ICD-10-CM | POA: Insufficient documentation

## 2020-12-28 DIAGNOSIS — N3001 Acute cystitis with hematuria: Secondary | ICD-10-CM

## 2020-12-28 DIAGNOSIS — Z87891 Personal history of nicotine dependence: Secondary | ICD-10-CM | POA: Diagnosis not present

## 2020-12-28 DIAGNOSIS — E0865 Diabetes mellitus due to underlying condition with hyperglycemia: Secondary | ICD-10-CM

## 2020-12-28 DIAGNOSIS — R739 Hyperglycemia, unspecified: Secondary | ICD-10-CM | POA: Diagnosis not present

## 2020-12-28 DIAGNOSIS — R81 Glycosuria: Secondary | ICD-10-CM | POA: Insufficient documentation

## 2020-12-28 DIAGNOSIS — Z7984 Long term (current) use of oral hypoglycemic drugs: Secondary | ICD-10-CM | POA: Diagnosis not present

## 2020-12-28 DIAGNOSIS — Z7951 Long term (current) use of inhaled steroids: Secondary | ICD-10-CM | POA: Insufficient documentation

## 2020-12-28 DIAGNOSIS — Z79899 Other long term (current) drug therapy: Secondary | ICD-10-CM | POA: Diagnosis not present

## 2020-12-28 DIAGNOSIS — I10 Essential (primary) hypertension: Secondary | ICD-10-CM | POA: Diagnosis not present

## 2020-12-28 DIAGNOSIS — E1165 Type 2 diabetes mellitus with hyperglycemia: Secondary | ICD-10-CM | POA: Insufficient documentation

## 2020-12-28 DIAGNOSIS — B9689 Other specified bacterial agents as the cause of diseases classified elsewhere: Secondary | ICD-10-CM | POA: Insufficient documentation

## 2020-12-28 DIAGNOSIS — J45909 Unspecified asthma, uncomplicated: Secondary | ICD-10-CM | POA: Insufficient documentation

## 2020-12-28 DIAGNOSIS — Z7982 Long term (current) use of aspirin: Secondary | ICD-10-CM | POA: Diagnosis not present

## 2020-12-28 DIAGNOSIS — J449 Chronic obstructive pulmonary disease, unspecified: Secondary | ICD-10-CM | POA: Diagnosis not present

## 2020-12-28 LAB — CBG MONITORING, ED
Glucose-Capillary: 416 mg/dL — ABNORMAL HIGH (ref 70–99)
Glucose-Capillary: 387 mg/dL — ABNORMAL HIGH (ref 70–99)
Glucose-Capillary: 295 mg/dL — ABNORMAL HIGH (ref 70–99)

## 2020-12-28 LAB — POCT URINALYSIS DIP (MANUAL ENTRY)
Bilirubin, UA: NEGATIVE
Glucose, UA: >=1000 mg/dL — AB
Ketones, POC UA: NEGATIVE mg/dL
Nitrite, UA: NEGATIVE
Protein Ur, POC: 30 mg/dL — AB
Spec Grav, UA: 1.025 (ref 1.010–1.025)
Urobilinogen, UA: 1 E.U./dL
pH, UA: 5 (ref 5.0–8.0)

## 2020-12-28 LAB — BASIC METABOLIC PANEL
Anion gap: 11 (ref 5–15)
BUN: 24 mg/dL — ABNORMAL HIGH (ref 6–20)
CO2: 30 mmol/L (ref 22–32)
Calcium: 9.7 mg/dL (ref 8.9–10.3)
Chloride: 89 mmol/L — ABNORMAL LOW (ref 98–111)
Creatinine, Ser: 1.13 mg/dL — ABNORMAL HIGH (ref 0.44–1.00)
GFR, Estimated: 56 mL/min — ABNORMAL LOW (ref >60)
Glucose, Bld: 411 mg/dL — ABNORMAL HIGH (ref 70–99)
Potassium: 4.5 mmol/L (ref 3.5–5.1)
Sodium: 130 mmol/L — ABNORMAL LOW (ref 135–145)

## 2020-12-28 LAB — URINALYSIS, ROUTINE W REFLEX MICROSCOPIC
Bilirubin Urine: NEGATIVE
Glucose, UA: >=500 mg/dL — AB
Ketones, ur: NEGATIVE mg/dL
Nitrite: NEGATIVE
Protein, ur: 30 mg/dL — AB
Specific Gravity, Urine: 1.023 (ref 1.005–1.030)
pH: 5 (ref 5.0–8.0)

## 2020-12-28 LAB — CBC
HCT: 42 % (ref 36.0–46.0)
Hemoglobin: 13.5 g/dL (ref 12.0–15.0)
MCH: 26.9 pg (ref 26.0–34.0)
MCHC: 32.1 g/dL (ref 30.0–36.0)
MCV: 83.8 fL (ref 80.0–100.0)
Platelets: 261 10*3/uL (ref 150–400)
RBC: 5.01 MIL/uL (ref 3.87–5.11)
RDW: 12.7 % (ref 11.5–15.5)
WBC: 7.9 10*3/uL (ref 4.0–10.5)
nRBC: 0 % (ref 0.0–0.2)

## 2020-12-28 LAB — POCT FASTING CBG KUC MANUAL ENTRY: POCT Glucose (KUC): 600 mg/dL — AB (ref 70–99)

## 2020-12-28 MED ORDER — NITROFURANTOIN MONOHYD MACRO 100 MG PO CAPS: 100.0000 mg | ORAL_CAPSULE | Freq: Two times a day (BID) | ORAL | 0 refills | Status: DC

## 2020-12-28 MED ORDER — SODIUM CHLORIDE 0.9 % IV BOLUS
1000.0000 mL | Freq: Once | INTRAVENOUS | Status: AC
  Administered 2020-12-28: 1000 mL via INTRAVENOUS

## 2020-12-28 MED ORDER — INSULIN ASPART 100 UNIT/ML IJ SOLN
10.0000 [IU] | Freq: Once | INTRAMUSCULAR | Status: AC
  Administered 2020-12-28: 10 [IU] via SUBCUTANEOUS
  Filled 2020-12-28: qty 1

## 2020-12-28 MED ORDER — CEPHALEXIN 500 MG PO CAPS
1000.0000 mg | ORAL_CAPSULE | Freq: Once | ORAL | Status: AC
  Administered 2020-12-28: 1000 mg via ORAL
  Filled 2020-12-28: qty 2

## 2020-12-28 MED ORDER — CEPHALEXIN 500 MG PO CAPS: 500.0000 mg | ORAL_CAPSULE | Freq: Four times a day (QID) | ORAL | 0 refills | Status: DC

## 2020-12-28 NOTE — ED Triage Notes (Signed)
Lower abd pain since Wednesday.  Pain on urination

## 2020-12-28 NOTE — Discharge Instructions (Addendum)
Drink plenty of fluids.  Make sure you take your insulin.  Follow-up with your doctor in a week to check a urine

## 2020-12-28 NOTE — ED Notes (Signed)
Pt given saltine crackers and water per DR Roderic Palau ok

## 2020-12-28 NOTE — ED Provider Notes (Signed)
[facility]   CC: Painful urination Abbreviated note:  SUBJECTIVE:  [name] is a 60 y.o. female who complains of painful urination x 2 days.  Admits to similar symptoms in the past with UTI.  Hx significant for DM.  Denies CP, SOB.    LMP: Patient's last menstrual period was 02/25/2019.  ROS: As in HPI.  All other pertinent ROS negative.     Past Medical History:  Diagnosis Date   Allergy    Asthma    COPD (chronic obstructive pulmonary disease) ([place])    Diabetes mellitus without complication ([place])    Hypertension    Morbid obesity due to excess calories ([place])    Umbilical hernia    Past Surgical History:  Procedure Laterality Date   CARPAL TUNNEL RELEASE Right    CHOLECYSTECTOMY     COLONOSCOPY N/A 01/12/2015   Procedure: COLONOSCOPY;  Surgeon: Danie Binder, MD;  [facility];  Service: Endoscopy;  Laterality: N/A;  0830   HYSTEROSCOPY WITH D & C N/A 04/01/2013   Procedure: DILATATION AND CURETTAGE /HYSTEROSCOPY;  Surgeon: Florian Buff, MD;  Location: [facility];  Service: Gynecology;  Laterality: N/A;   NASAL POLYP SURGERY     No Known Allergies No current facility-administered medications on file prior to encounter.   Current Outpatient Medications on File Prior to Encounter  Medication Sig Dispense Refill   amLODipine (NORVASC) 5 MG tablet Take 1 tablet by mouth daily.     aspirin EC 81 MG tablet Take 81 mg by mouth daily.     B-D UF III MINI PEN NEEDLES 31G X 5 MM MISC USE ONCE DAILY 100 each 3   diclofenac sodium (VOLTAREN) 1 % GEL 4 (four) times daily as needed.     fluticasone (FLONASE) 50 MCG/ACT nasal spray Place 2 sprays into both nostrils daily.     Fluticasone-Salmeterol (ADVAIR) 250-50 MCG/DOSE AEPB Inhale 1 puff into the lungs 2 (two) times daily.      gabapentin (NEURONTIN) 100 MG capsule Take 1 capsule by mouth 2 (two) times daily.     glipiZIDE (GLUCOTROL XL) 5 MG 24 hr tablet TAKE 1 TABLET BY MOUTH EVERY DAY WITH BREAKFAST 90  tablet 0   insulin glargine, 2 Unit Dial, (TOUJEO MAX SOLOSTAR) 300 UNIT/ML Solostar Pen Inject 100 Units into the skin at bedtime. 27 mL 0   ipratropium-albuterol (DUONEB) 0.5-2.5 (3) MG/3ML SOLN Take 3 mLs by nebulization every 4 (four) hours as needed (Shortness of Breath).      loratadine (CLARITIN) 10 MG tablet Take 10 mg by mouth daily.     losartan-hydrochlorothiazide (HYZAAR) 100-25 MG tablet Take 1 tablet by mouth daily.     metFORMIN (GLUCOPHAGE) 500 MG tablet TAKE 1 TABLET BY MOUTH 2 TIMES DAILY WITH A MEAL. 180 tablet 0   Multiple Vitamin (MULTIVITAMIN WITH MINERALS) TABS tablet Take 1 tablet by mouth daily.     rosuvastatin (CRESTOR) 20 MG tablet Take 20 mg by mouth daily.      traMADol (ULTRAM) 50 MG tablet Take 50 mg by mouth 2 (two) times daily.  0   Social History   Socioeconomic History   Marital status: Married    Spouse name: Not on file   Number of children: 2   Years of education: Not on file   Highest education level: Not on file  Occupational History   Not on file  Tobacco Use   Smoking status: Former    Packs/day: 0.50  Years: 20.00    Pack years: 10.00    Types: Cigarettes    Quit date: 03/25/1997    Years since quitting: 23.7   Smokeless tobacco: Never  Vaping Use   Vaping Use: Never used  Substance and Sexual Activity   Alcohol use: Yes    Alcohol/week: 0.0 standard drinks    Comment: Ocassional   Drug use: No   Sexual activity: Yes    Birth control/protection: None, Post-menopausal  Other Topics Concern   Not on file  Social History Narrative   Not on file   Social Determinants of Health   Financial Resource Strain: Not on file  Food Insecurity: Not on file  Transportation Needs: Not on file  Physical Activity: Not on file  Stress: Not on file  Social Connections: Not on file  Intimate Partner Violence: Not on file   Family History  Problem Relation Age of Onset   Diabetes Mother    Kidney disease Mother    Other Mother         renal failure   Hypertension Mother    Cancer Mother        ?uterine cancer    Diabetes Father    Cancer Father        pancreatic   Diabetes Sister    Kidney disease Sister    Diabetes Maternal Grandmother    Asthma Maternal Grandmother    COPD Maternal Grandmother        bronchitis   Hypertension Maternal Grandmother    Thyroid disease Maternal Grandmother    Hypertension Daughter    Colon cancer Neg Hx     OBJECTIVE:  Vitals:   12/28/20 1712  BP: 128/82  Pulse: (!) 104  Resp: 14  Temp: 99.1 F (37.3 C)  TempSrc: Tympanic  SpO2: 90%   General appearance: AOx3 in no acute distress HEENT: NCAT.  Oropharynx clear.  Lungs: normal respiratory effort Back: no CVA tenderness Extremities: no edema; symmetrical with no gross deformities Skin: warm and dry Neurologic: Ambulates from chair to exam table without difficulty Psychological: alert and cooperative; normal mood and affect  Labs Reviewed  POCT URINALYSIS DIP (MANUAL ENTRY) - Abnormal; Notable for the following components:      Result Value   Glucose, UA >=1,000 (*)    Blood, UA small (*)    Protein Ur, POC =30 (*)    Leukocytes, UA Trace (*)    All other components within normal limits  URINE CULTURE  POCT FASTING CBG KUC MANUAL ENTRY    ASSESSMENT & PLAN:  1. Acute cystitis with hematuria   2. Glucosuria   3. Diabetes mellitus due to underlying condition with hyperglycemia, with long-term current use of insulin (HCC)     Meds ordered this encounter  Medications   nitrofurantoin, macrocrystal-monohydrate, (MACROBID) 100 MG capsule    Sig: Take 1 capsule (100 mg total) by mouth 2 (two) times daily.    Dispense:  10 capsule    Refill:  0    Order Specific Question:   Supervising Provider    Answer:   Raylene Everts [id number]   Urine >1000 glucose.  CBG > 600.  Recommending further evaluation and management for DM with hyperglycemia.  Patient aware and in agreement.  With travel with husband by  private vehicle to [facility] ED.       Lestine Box, PA-C 12/28/20 [id number]

## 2020-12-28 NOTE — ED Provider Notes (Signed)
[facility] Provider Note   CSN: [id number] Arrival date & time: 12/28/20  1808     History Chief Complaint  Patient presents with  . Hyperglycemia    [name] is a 60 y.o. female.  Patient complains of dysuria urinary frequency and her glucose has been elevated.  She has not been taking her insulin last couple days  The history is provided by the patient and medical records. No language interpreter was used.  Hyperglycemia Severity:  Moderate Onset quality:  Gradual Duration:  5 days Timing:  Constant Progression:  Worsening Chronicity:  New Diabetes status:  Controlled with oral medications Context: not change in medication   Relieved by:  Nothing Ineffective treatments:  None tried Associated symptoms: dysuria   Associated symptoms: no abdominal pain, no chest pain and no fatigue   Risk factors: obesity       Past Medical History:  Diagnosis Date  . Allergy   . Asthma   . COPD (chronic obstructive pulmonary disease) ([place])   . Diabetes mellitus without complication ([place])   . Hypertension   . Morbid obesity due to excess calories ([place])   . Umbilical hernia     Patient Active Problem List   Diagnosis Date Noted  . Personal history of noncompliance with medical treatment, presenting hazards to health 03/12/2020  . Uncontrolled type 2 diabetes mellitus with hyperglycemia ([place]) 05/30/2019  . Mixed hyperlipidemia 05/30/2019  . Screening for colorectal cancer 03/21/2019  . PMB (postmenopausal bleeding) 03/21/2019  . Encounter for gynecological examination with Papanicolaou smear of cervix 03/21/2019  . Fibroids 03/21/2019  . Special screening for malignant neoplasms, colon   . Encounter for screening colonoscopy 12/14/2014  . Thickened endometrium 03/15/2013  . Essential hypertension, benign 03/07/2013  . Diabetes mellitus, type II ([place]) 03/07/2013  . Asthma 03/07/2013  . Postmenopausal bleeding 03/07/2013  . Morbid obesity ([place])  03/07/2013    Past Surgical History:  Procedure Laterality Date  . CARPAL TUNNEL RELEASE Right   . CHOLECYSTECTOMY    . COLONOSCOPY N/A 01/12/2015   Procedure: COLONOSCOPY;  Surgeon: Danie Binder, MD;  Location: [facility];  Service: Endoscopy;  Laterality: N/A;  0830  . HYSTEROSCOPY WITH D & C N/A 04/01/2013   Procedure: DILATATION AND CURETTAGE /HYSTEROSCOPY;  Surgeon: Florian Buff, MD;  Location: [facility] ORS;  Service: Gynecology;  Laterality: N/A;  . NASAL POLYP SURGERY       OB History     Gravida  2   Para  2   Term  2   Preterm      AB      Living  2      SAB      IAB      Ectopic      Multiple      Live Births  2           Family History  Problem Relation Age of Onset  . Diabetes Mother   . Kidney disease Mother   . Other Mother        renal failure  . Hypertension Mother   . Cancer Mother        ?uterine cancer   . Diabetes Father   . Cancer Father        pancreatic  . Diabetes Sister   . Kidney disease Sister   . Diabetes Maternal Grandmother   . Asthma Maternal Grandmother   . COPD Maternal Grandmother  bronchitis  . Hypertension Maternal Grandmother   . Thyroid disease Maternal Grandmother   . Hypertension Daughter   . Colon cancer Neg Hx     Social History   Tobacco Use  . Smoking status: Former    Packs/day: 0.50    Years: 20.00    Pack years: 10.00    Types: Cigarettes    Quit date: 03/25/1997    Years since quitting: 23.7  . Smokeless tobacco: Never  Vaping Use  . Vaping Use: Never used  Substance Use Topics  . Alcohol use: Yes    Alcohol/week: 0.0 standard drinks    Comment: Ocassional  . Drug use: No    Home Medications Prior to Admission medications   Medication Sig Start Date End Date Taking? Authorizing Provider  amLODipine (NORVASC) 5 MG tablet Take 1 tablet by mouth daily. 10/09/20  Yes [provider]  aspirin EC 81 MG tablet Take 81 mg by mouth daily.   Yes [provider]   cephALEXin (KEFLEX) 500 MG capsule Take 1 capsule (500 mg total) by mouth 4 (four) times daily. 12/28/20  Yes Milton Ferguson, MD  diclofenac sodium (VOLTAREN) 1 % GEL 4 (four) times daily as needed. 04/03/19  Yes [provider]  fluticasone (FLONASE) 50 MCG/ACT nasal spray Place 2 sprays into both nostrils daily.   Yes [provider]  Fluticasone-Salmeterol (ADVAIR) 250-50 MCG/DOSE AEPB Inhale 1 puff into the lungs 2 (two) times daily.    Yes [provider]  gabapentin (NEURONTIN) 100 MG capsule Take 1 capsule by mouth 2 (two) times daily. 11/21/20  Yes [provider]  glipiZIDE (GLUCOTROL XL) 5 MG 24 hr tablet TAKE 1 TABLET BY MOUTH EVERY DAY WITH BREAKFAST Patient taking differently: Take 5 mg by mouth daily with breakfast. 10/29/20  Yes Nida, Marella Chimes, MD  insulin glargine, 2 Unit Dial, (TOUJEO MAX SOLOSTAR) 300 UNIT/ML Solostar Pen Inject 100 Units into the skin at bedtime. 11/27/20  Yes Nida, Marella Chimes, MD  Ipratropium-Albuterol (COMBIVENT RESPIMAT IN) Inhale 1 puff into the lungs daily as needed (shortness of breath).   Yes [provider]  ipratropium-albuterol (DUONEB) 0.5-2.5 (3) MG/3ML SOLN Take 3 mLs by nebulization every 4 (four) hours as needed (Shortness of Breath).    Yes [provider]  losartan-hydrochlorothiazide (HYZAAR) 100-25 MG tablet Take 1 tablet by mouth daily. 10/10/20  Yes [provider]  metFORMIN (GLUCOPHAGE) 500 MG tablet TAKE 1 TABLET BY MOUTH 2 TIMES DAILY WITH A MEAL. 12/19/20  Yes Nida, Marella Chimes, MD  Multiple Vitamin (MULTIVITAMIN WITH MINERALS) TABS tablet Take 1 tablet by mouth daily.   Yes [provider]  rosuvastatin (CRESTOR) 20 MG tablet Take 20 mg by mouth daily.    Yes [provider]  traMADol (ULTRAM) 50 MG tablet Take 50 mg by mouth 2 (two) times daily. 08/04/15  Yes [provider]  B-D UF III MINI PEN NEEDLES 31G X 5 MM MISC USE ONCE DAILY  10/26/20   [place] Anger, MD  ipratropium (ATROVENT) 0.06 % nasal spray  11/16/20   [provider]  loratadine (CLARITIN) 10 MG tablet Take 10 mg by mouth daily. Patient not taking: No sig reported 02/27/20   [provider]  nitrofurantoin, macrocrystal-monohydrate, (MACROBID) 100 MG capsule Take 1 capsule (100 mg total) by mouth 2 (two) times daily. Patient not taking: No sig reported 12/28/20   Stacey Drain [place], PA-C    Allergies    Patient has no known allergies.  Review of Systems   Review of Systems  Constitutional:  Negative for appetite change and fatigue.  HENT:  Negative for congestion, ear discharge and sinus pressure.   Eyes:  Negative for discharge.  Respiratory:  Negative for cough.   Cardiovascular:  Negative for chest pain.  Gastrointestinal:  Negative for abdominal pain and diarrhea.  Genitourinary:  Positive for dysuria. Negative for frequency and hematuria.  Musculoskeletal:  Negative for back pain.  Skin:  Negative for rash.  Neurological:  Negative for seizures and headaches.  Psychiatric/Behavioral:  Negative for hallucinations.    Physical Exam Updated Vital Signs BP (!) 129/58   Pulse 92   Temp 99.1 F (37.3 C) (Oral)   Resp 18   LMP 02/25/2019   SpO2 97%   Physical Exam Vitals and nursing note reviewed.  Constitutional:      Appearance: She is well-developed.  HENT:     Head: Normocephalic.     Nose: Nose normal.  Eyes:     General: No scleral icterus.    Conjunctiva/sclera: Conjunctivae normal.  Neck:     Thyroid: No thyromegaly.  Cardiovascular:     Rate and Rhythm: Normal rate and regular rhythm.     Heart sounds: No murmur heard.   No friction rub. No gallop.  Pulmonary:     Breath sounds: No stridor. No wheezing or rales.  Chest:     Chest wall: No tenderness.  Abdominal:     General: There is no distension.     Tenderness: There is no abdominal tenderness. There is no rebound.  Musculoskeletal:         General: Normal range of motion.     Cervical back: Neck supple.  Lymphadenopathy:     Cervical: No cervical adenopathy.  Skin:    Findings: No erythema or rash.  Neurological:     Mental Status: She is oriented to person, place, and time.     Motor: No abnormal muscle tone.     Coordination: Coordination normal.  Psychiatric:        Behavior: Behavior normal.    ED Results / Procedures / Treatments   Labs (all labs ordered are listed, but only abnormal results are displayed) Labs Reviewed  BASIC METABOLIC PANEL - Abnormal; Notable for the following components:      Result Value   Sodium 130 (*)    Chloride 89 (*)    Glucose, Bld 411 (*)    BUN 24 (*)    Creatinine, Ser 1.13 (*)    GFR, Estimated 56 (*)    All other components within normal limits  URINALYSIS, ROUTINE W REFLEX MICROSCOPIC - Abnormal; Notable for the following components:   APPearance HAZY (*)    Glucose, UA >=500 (*)    Hgb urine dipstick MODERATE (*)    Protein, ur 30 (*)    Leukocytes,Ua TRACE (*)    Bacteria, UA MANY (*)    All other components within normal limits  CBG MONITORING, ED - Abnormal; Notable for the following components:   Glucose-Capillary 416 (*)    All other components within normal limits  CBG MONITORING, ED - Abnormal; Notable for the following components:   Glucose-Capillary 387 (*)    All other components within normal limits  CBG MONITORING, ED - Abnormal; Notable for the following components:   Glucose-Capillary 295 (*)    All other components within normal limits  CBC  CBG MONITORING, ED    EKG None  Radiology No results found.  Procedures Procedures   Medications Ordered in ED Medications  sodium chloride 0.9 % bolus 1,000 mL (0 mLs Intravenous Stopped 12/28/20 2250)  insulin aspart (novoLOG) injection 10 Units (10 Units Subcutaneous Given 12/28/20 2109)  cephALEXin (KEFLEX) capsule 1,000 mg (1,000 mg Oral Given 12/28/20 2224)    ED Course  I have reviewed the  triage vital signs and the nursing notes.  Pertinent labs & imaging results that were available during my care of the patient were reviewed by me and considered in my medical decision making (see chart for details).    MDM Rules/Calculators/A&P                           Patient with urinary tract infection and hyperglycemia.  Patient improved with fluids and insulin.  She is started on Keflex for UTI and will follow up with her PCP and take her insulin Final Clinical Impression(s) / ED Diagnoses Final diagnoses:  Hyperglycemia  Acute cystitis with hematuria    Rx / DC Orders ED Discharge Orders          Ordered    cephALEXin (KEFLEX) 500 MG capsule  4 times daily        12/28/20 2317             Milton Ferguson, MD 12/31/20 1103

## 2020-12-28 NOTE — ED Triage Notes (Signed)
Referral from urgent care, states she was advised her blood sugar was reading high. States she has had urinary frequency for over a week

## 2020-12-31 LAB — URINE CULTURE: Culture: >=100000 — AB

## 2021-01-01 ENCOUNTER — Encounter: Payer: Self-pay | Admitting: Nutrition

## 2021-01-01 ENCOUNTER — Encounter: Payer: BC Managed Care – PPO | Attending: "Endocrinology | Admitting: Nutrition

## 2021-01-01 ENCOUNTER — Other Ambulatory Visit: Payer: Self-pay

## 2021-01-01 VITALS — Ht 61.0 in | Wt 223.0 lb

## 2021-01-01 DIAGNOSIS — E782 Mixed hyperlipidemia: Secondary | ICD-10-CM | POA: Insufficient documentation

## 2021-01-01 DIAGNOSIS — E1165 Type 2 diabetes mellitus with hyperglycemia: Secondary | ICD-10-CM | POA: Insufficient documentation

## 2021-01-01 DIAGNOSIS — I10 Essential (primary) hypertension: Secondary | ICD-10-CM | POA: Insufficient documentation

## 2021-01-01 NOTE — Progress Notes (Signed)
Medical Nutrition Therapy  Appointment Start time:  1600  Appointment End time:  1700  Primary concerns today: DM Type 2, Obesity  Referral diagnosis: E11.8, D66.9 Preferred learning style:  no preference indicated Learning readiness:  contemplating,    NUTRITION ASSESSMENT  She has been eating a lot of fruit and trying to eat better. FBS 180-190 and at night. Toujeo 100 mg/dl, Metformin 500 mg BID and Glipizide 5 mg a day. Had been missing her medications when she is off on the weekends. 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  Went to [employer] last week but forgot her meter.  Went to urgent care and BS was high. Had a bladder infection and was treated.  Is now giving herself her insulin shots. Just recentnly started being able to do that. Anthropometrics  Wt Readings from Last 3 Encounters:  01/01/21 223 lb (101.2 kg)  11/22/20 235 lb 3.2 oz (106.7 kg)  11/14/20 235 lb (106.6 kg)   Ht Readings from Last 3 Encounters:  01/01/21 '5\' 1"'$  (1.549 m)  11/22/20 '5\' 1"'$  (1.549 m)  11/14/20 '5\' 1"'$  (1.549 m)   Body mass index is 42.14 kg/m. '@BMIFA'$ @ Facility age limit for growth percentiles is 20 years. Facility age limit for growth percentiles is 20 years.    Clinical Medical Hx: DM Type 2, Obesity, spinal fluid on her brain Medications: see chart Toujeo, Glipizide 5 mg twice a day, Metformin 500 mg BID Labs:  Lab Results  Component Value Date   HGBA1C 12.1 (A) 11/14/2020   CMP Latest Ref Rng & Units 12/28/2020 11/07/2020 04/21/2019  Glucose 70 - 99 mg/dL 411(H) 264(H) 295(H)  BUN 6 - 20 mg/dL 24(H) 15 15  Creatinine 0.44 - 1.00 mg/dL 1.13(H) 0.70 0.68  Sodium 135 - 145 mmol/L 130(L) 138 139  Potassium 3.5 - 5.1 mmol/L 4.5 4.5 4.3  Chloride 98 - 111 mmol/L 89(L) 99 103  CO2 22 - 32 mmol/L '30 24 23  '$ Calcium 8.9 - 10.3 mg/dL 9.7 9.7 10.1  Total Protein 6.0  - 8.5 g/dL - 6.9 7.7  Total Bilirubin 0.0 - 1.2 mg/dL - <0.2 0.6  Alkaline Phos 44 - 121 IU/L - 88 67  AST 0 - 40 IU/L - 14 27  ALT 0 - 32 IU/L - 18 26     Notable Signs/Symptoms: Increased thirst, UTI, frequent urination, fatigue, no energy, hunger, blurry vision  Lifestyle & Dietary Hx Works full time. Not compliant with medications on weekends. Works early am shift. Meal times get off schedule.  Estimated daily fluid intake: 48 oz Supplements: none Sleep: 6-8 hrs  Stress / self-care: life Current average weekly physical activity: none  24-Hr Dietary Recall First Meal:  4 am Bowl of Mulitgrain cherriods, 1/2 c milk,  Snack: Watermelon, pistachios, 5 cherries, and 6 strawberries,  Second Meal: 8 oz ribeye, dressing with salad, 5 cherries and 5 strawberries, water with lemon Snack: Water Third Meal: Toss salad with dressing and ham, water,  Snack: cherries, 5 Beverages: water  Estimated Energy Needs Calories: 1200 Carbohydrate: 135g Protein: 90g Fat: 33g   NUTRITION DIAGNOSIS  NB-1.1 Food and nutrition-related knowledge deficit As related to Diabetes Type 2 and Obesity.  As evidenced by A1C and BMI.   NUTRITION INTERVENTION  Nutrition education (E-1) on the following topics:  Nutrition and Diabetes education provided on My Plate, CHO  counting, meal planning, portion sizes, timing of meals, avoiding snacks between meals unless having a low blood sugar, target ranges for A1C and blood sugars, signs/symptoms and treatment of hyper/hypoglycemia, monitoring blood sugars, taking medications as prescribed, benefits of exercising 30 minutes per day and prevention of complications of DM.` Weight loss tips Healthy lifestyle  Handouts Provided Include  My Plate  Meal Plan Card Diabetes instructions Lifestyle nutrition  Learning Style & Readiness for Change Teaching method utilized: Visual & Auditory  Demonstrated degree of understanding via: Teach Back  Barriers to  learning/adherence to lifestyle change: none  Goals Established by Pt Goals Cut out processed foods.  Follow My Plate Eat 30 g CHO at each meal. Let PCP know if BS are in the 70's. Be sure take meds and insulin on time Eat meals on times we discussed Avoid unnecessary snacks between meals Get Dm alert necklace Get A1C down to 7  Drink a gallon of water per day   MONITORING & EVALUATION Dietary intake, weekly physical activity, and blood sugars  in 1 month.  Next Steps  Patient is to work on meal planning and weight loss.

## 2021-01-01 NOTE — Patient Instructions (Addendum)
Goals Cut out processed foods.  Follow My Plate Eat 30 g CHO at each meal. Let PCP know if BS are in the 70's. Be sure take meds and insulin on time Eat meals on times we discussed Avoid unnecessary snacks between meals Get Dm alert necklace Get A1C down to 7  Drink a gallon of water per day

## 2021-01-03 DIAGNOSIS — R9402 Abnormal brain scan: Secondary | ICD-10-CM | POA: Diagnosis not present

## 2021-01-03 DIAGNOSIS — G9601 Cranial cerebrospinal fluid leak, spontaneous: Secondary | ICD-10-CM | POA: Diagnosis not present

## 2021-01-03 DIAGNOSIS — G9600 Cerebrospinal fluid leak, unspecified: Secondary | ICD-10-CM | POA: Diagnosis not present

## 2021-01-07 ENCOUNTER — Encounter: Payer: Self-pay | Admitting: Nutrition

## 2021-01-10 DIAGNOSIS — Q019 Encephalocele, unspecified: Secondary | ICD-10-CM | POA: Diagnosis not present

## 2021-01-10 DIAGNOSIS — G9601 Cranial cerebrospinal fluid leak, spontaneous: Secondary | ICD-10-CM | POA: Diagnosis not present

## 2021-01-17 DIAGNOSIS — E1142 Type 2 diabetes mellitus with diabetic polyneuropathy: Secondary | ICD-10-CM | POA: Diagnosis not present

## 2021-01-17 DIAGNOSIS — I10 Essential (primary) hypertension: Secondary | ICD-10-CM | POA: Diagnosis not present

## 2021-01-17 DIAGNOSIS — Z0001 Encounter for general adult medical examination with abnormal findings: Secondary | ICD-10-CM | POA: Diagnosis not present

## 2021-01-17 DIAGNOSIS — I700 Atherosclerosis of aorta: Secondary | ICD-10-CM | POA: Diagnosis not present

## 2021-01-23 ENCOUNTER — Telehealth: Payer: Self-pay | Admitting: "Endocrinology

## 2021-01-23 NOTE — Telephone Encounter (Signed)
Patient states that [name] mentioned her trying a freestyle libre and would like to know if Dr. Dorris Fetch agrees and could send that in.

## 2021-01-23 NOTE — Telephone Encounter (Signed)
Left pt message

## 2021-01-24 DIAGNOSIS — E1142 Type 2 diabetes mellitus with diabetic polyneuropathy: Secondary | ICD-10-CM | POA: Diagnosis not present

## 2021-01-24 DIAGNOSIS — I700 Atherosclerosis of aorta: Secondary | ICD-10-CM | POA: Diagnosis not present

## 2021-01-24 DIAGNOSIS — I10 Essential (primary) hypertension: Secondary | ICD-10-CM | POA: Diagnosis not present

## 2021-01-24 LAB — BASIC METABOLIC PANEL
BUN: 14 (ref 4–21)
CO2: 25 — AB (ref 13–22)
Chloride: 98 — AB (ref 99–108)
Creatinine: 0.8 (ref 0.5–1.1)
Glucose: 197
Potassium: 4.6 (ref 3.4–5.3)
Sodium: 138 (ref 137–147)

## 2021-01-24 LAB — LIPID PANEL
Cholesterol: 113 (ref 0–200)
HDL: 35 (ref 35–70)
LDL Cholesterol: 59
Triglycerides: 102 (ref 40–160)

## 2021-01-24 LAB — HEMOGLOBIN A1C: Hemoglobin A1C: 11.8

## 2021-01-24 LAB — COMPREHENSIVE METABOLIC PANEL: Calcium: 10.2 (ref 8.7–10.7)

## 2021-02-26 ENCOUNTER — Ambulatory Visit: Payer: BC Managed Care – PPO | Admitting: Nutrition

## 2021-02-26 ENCOUNTER — Other Ambulatory Visit: Payer: Self-pay

## 2021-02-26 ENCOUNTER — Other Ambulatory Visit: Payer: Self-pay | Admitting: "Endocrinology

## 2021-02-26 ENCOUNTER — Ambulatory Visit: Payer: BC Managed Care – PPO | Admitting: "Endocrinology

## 2021-03-01 DIAGNOSIS — G932 Benign intracranial hypertension: Secondary | ICD-10-CM | POA: Diagnosis not present

## 2021-03-09 ENCOUNTER — Other Ambulatory Visit: Payer: Self-pay | Admitting: "Endocrinology

## 2021-03-13 ENCOUNTER — Other Ambulatory Visit: Payer: Self-pay

## 2021-03-13 ENCOUNTER — Ambulatory Visit (INDEPENDENT_AMBULATORY_CARE_PROVIDER_SITE_OTHER): Payer: BC Managed Care – PPO | Admitting: "Endocrinology

## 2021-03-13 ENCOUNTER — Encounter: Payer: Self-pay | Admitting: "Endocrinology

## 2021-03-13 VITALS — BP 142/96 | HR 80 | Ht 61.0 in | Wt 229.0 lb

## 2021-03-13 DIAGNOSIS — I10 Essential (primary) hypertension: Secondary | ICD-10-CM

## 2021-03-13 DIAGNOSIS — E782 Mixed hyperlipidemia: Secondary | ICD-10-CM

## 2021-03-13 DIAGNOSIS — E1165 Type 2 diabetes mellitus with hyperglycemia: Secondary | ICD-10-CM

## 2021-03-13 MED ORDER — GLIPIZIDE ER 5 MG PO TB24: ORAL_TABLET | ORAL | 1 refills | Status: DC

## 2021-03-13 NOTE — Progress Notes (Signed)
11/22/2020, 6:04 PM   Endocrinology follow-up note   Subjective:    Patient ID: [name], female    DOB: [birth year].  [name] is being seen in follow-up in the management of her currently uncontrolled type 2 diabetes, hypertension. [id number], Tesfaye, MD.   Past Medical History:  Diagnosis Date   Allergy    Asthma    COPD (chronic obstructive pulmonary disease) ([place])    Diabetes mellitus without complication ([place])    Hypertension    Morbid obesity due to excess calories ([place])    Umbilical hernia     Past Surgical History:  Procedure Laterality Date   CARPAL TUNNEL RELEASE Right    CHOLECYSTECTOMY     COLONOSCOPY N/A 01/12/2015   Procedure: COLONOSCOPY;  Surgeon: Danie Binder, MD;  Location: [facility];  Service: Endoscopy;  Laterality: N/A;  0830   HYSTEROSCOPY WITH D & C N/A 04/01/2013   Procedure: DILATATION AND CURETTAGE /HYSTEROSCOPY;  Surgeon: Florian Buff, MD;  Location: [facility] ORS;  Service: Gynecology;  Laterality: N/A;   NASAL POLYP SURGERY      Social History   Socioeconomic History   Marital status: Married    Spouse name: Not on file   Number of children: 2   Years of education: Not on file   Highest education level: Not on file  Occupational History   Not on file  Tobacco Use   Smoking status: Former    Packs/day: 0.50    Years: 20.00    Pack years: 10.00    Types: Cigarettes    Quit date: 03/25/1997    Years since quitting: 23.6   Smokeless tobacco: Never  Vaping Use   Vaping Use: Never used  Substance and Sexual Activity   Alcohol use: Yes    Alcohol/week: 0.0 standard drinks    Comment: Ocassional   Drug use: No   Sexual activity: Yes    Birth control/protection: None, Post-menopausal  Other Topics Concern   Not on file  Social History Narrative   Not on file   Social Determinants of Health   Financial Resource Strain: Not on file  Food  Insecurity: Not on file  Transportation Needs: Not on file  Physical Activity: Not on file  Stress: Not on file  Social Connections: Not on file    Family History  Problem Relation Age of Onset   Diabetes Mother    Kidney disease Mother    Other Mother        renal failure   Hypertension Mother    Cancer Mother        ?uterine cancer    Diabetes Father    Cancer Father        pancreatic   Diabetes Sister    Kidney disease Sister    Diabetes Maternal Grandmother    Asthma Maternal Grandmother    COPD Maternal Grandmother        bronchitis   Hypertension Maternal Grandmother    Thyroid disease Maternal Grandmother    Hypertension Daughter    Colon cancer Neg Hx     Outpatient Encounter Medications as of 11/22/2020  Medication Sig   amLODipine (  NORVASC) 5 MG tablet Take 1 tablet by mouth daily.   aspirin EC 81 MG tablet Take 81 mg by mouth daily.   B-D UF III MINI PEN NEEDLES 31G X 5 MM MISC USE ONCE DAILY   diclofenac sodium (VOLTAREN) 1 % GEL 4 (four) times daily as needed.   fluticasone (FLONASE) 50 MCG/ACT nasal spray Place 2 sprays into both nostrils daily.   Fluticasone-Salmeterol (ADVAIR) 250-50 MCG/DOSE AEPB Inhale 1 puff into the lungs 2 (two) times daily.    gabapentin (NEURONTIN) 100 MG capsule Take 1 capsule by mouth 2 (two) times daily.   glipiZIDE (GLUCOTROL XL) 5 MG 24 hr tablet TAKE 1 TABLET BY MOUTH EVERY DAY WITH BREAKFAST   insulin glargine, 2 Unit Dial, (TOUJEO MAX SOLOSTAR) 300 UNIT/ML Solostar Pen Inject 100 Units into the skin at bedtime.   ipratropium-albuterol (DUONEB) 0.5-2.5 (3) MG/3ML SOLN Take 3 mLs by nebulization every 4 (four) hours as needed (Shortness of Breath).    loratadine (CLARITIN) 10 MG tablet Take 10 mg by mouth daily.   losartan-hydrochlorothiazide (HYZAAR) 100-25 MG tablet Take 1 tablet by mouth daily.   metFORMIN (GLUCOPHAGE) 500 MG tablet TAKE 1 TABLET BY MOUTH 2 TIMES DAILY WITH A MEAL.   Multiple Vitamin (MULTIVITAMIN WITH  MINERALS) TABS tablet Take 1 tablet by mouth daily.   rosuvastatin (CRESTOR) 20 MG tablet Take 20 mg by mouth daily.    traMADol (ULTRAM) 50 MG tablet Take 50 mg by mouth 2 (two) times daily.   [DISCONTINUED] insulin glargine, 2 Unit Dial, (TOUJEO MAX SOLOSTAR) 300 UNIT/ML Solostar Pen Inject 80 Units into the skin 2 (two) times daily. (Patient taking differently: Inject 100 Units into the skin at bedtime.)   No facility-administered encounter medications on file as of 11/22/2020.    ALLERGIES: No Known Allergies  VACCINATION STATUS: Immunization History  Administered Date(s) Administered   Influenza-Unspecified 06/09/2010, 06/09/2013    Diabetes She presents for her follow-up diabetic visit. She has type 2 diabetes mellitus. Onset time: She was diagnosed at approximate age of 46 years. Her disease course has been worsening. There are no hypoglycemic associated symptoms. Pertinent negatives for hypoglycemia include no confusion, headaches, pallor or seizures. Pertinent negatives for diabetes include no chest pain, no fatigue, no polydipsia, no polyphagia and no polyuria. There are no hypoglycemic complications. Symptoms are improving. There are no diabetic complications. Risk factors for coronary artery disease include diabetes mellitus, dyslipidemia, family history, obesity, tobacco exposure, sedentary lifestyle, post-menopausal and hypertension. Current diabetic treatment includes insulin injections (She is currently on Basaglar 60 units twice daily,  Metformin 1000 mg p.o. twice daily, could not afford Januvia any longer, she remains on glipizide 5 mg XL p.o. daily breakfast.  ). Her weight is stable. She is following a generally unhealthy diet. When asked about meal planning, she reported none. She has not had a previous visit with a dietitian. She rarely participates in exercise. Her home blood glucose trend is decreasing steadily. Her breakfast blood glucose range is generally 180-200 mg/dl.  Her overall blood glucose range is 180-200 mg/dl. (She does not monitor blood glucose regularly.  Her previsit labs show A1c of 11.8%, only slightly better than 12.1%.  She did not document any hypoglycemia.   ) An ACE inhibitor/angiotensin II receptor blocker is being taken.  Hyperlipidemia This is a chronic problem. The current episode started more than 1 year ago. The problem is controlled. Exacerbating diseases include diabetes and obesity. Pertinent negatives include no chest pain, myalgias or shortness of  breath. Current antihyperlipidemic treatment includes statins. Risk factors for coronary artery disease include diabetes mellitus, dyslipidemia, family history, hypertension, obesity, a sedentary lifestyle and post-menopausal.  Hypertension This is a chronic problem. The current episode started more than 1 year ago. Pertinent negatives include no chest pain, headaches, palpitations or shortness of breath. Risk factors for coronary artery disease include diabetes mellitus, dyslipidemia, obesity, post-menopausal state, smoking/tobacco exposure, sedentary lifestyle and family history. Past treatments include ACE inhibitors.   Review of systems  Constitutional: + Minimally fluctuating body weight,  current  Body mass index is 44.44 kg/m. , no fatigue, no subjective hyperthermia, no subjective hypothermia    Objective:    BP 135/77   Pulse 85   Ht 5\' 1"  (1.549 m)   Wt 235 lb 3.2 oz (106.7 kg)   LMP 02/25/2019   BMI 44.44 kg/m   Wt Readings from Last 3 Encounters:  11/22/20 235 lb 3.2 oz (106.7 kg)  11/14/20 235 lb (106.6 kg)  07/18/20 235 lb (106.6 kg)    Physical Exam- Limited  Constitutional:  Body mass index is 44.44 kg/m. , not in acute distress, normal state of mind    CMP     Component Value Date/Time   NA 138 11/07/2020 0735   K 4.5 11/07/2020 0735   CL 99 11/07/2020 0735   CO2 24 11/07/2020 0735   GLUCOSE 264 (H) 11/07/2020 0735   GLUCOSE 295 (H) 04/21/2019 0854    BUN 15 11/07/2020 0735   CREATININE 0.70 11/07/2020 0735   CALCIUM 9.7 11/07/2020 0735   PROT 6.9 11/07/2020 0735   ALBUMIN 4.2 11/07/2020 0735   AST 14 11/07/2020 0735   ALT 18 11/07/2020 0735   ALKPHOS 88 11/07/2020 0735   BILITOT <0.2 11/07/2020 0735   GFRNONAA >60 04/21/2019 0854   GFRAA >60 04/21/2019 0854     Diabetic Labs (most recent): Lab Results  Component Value Date   HGBA1C 12.1 (A) 11/14/2020   HGBA1C 8.6 (A) 07/18/2020   HGBA1C 13.2 (A) 03/12/2020     Lipid Panel ( most recent) Lipid Panel     Component Value Date/Time   CHOL 128 11/07/2020 0735   TRIG 120 11/07/2020 0735   HDL 38 (L) 11/07/2020 0735   CHOLHDL 3.4 11/07/2020 0735   CHOLHDL 3.5 04/21/2019 0855   VLDL 28 04/21/2019 0855   LDLCALC 68 11/07/2020 0735   LABVLDL 22 11/07/2020 0735      Lab Results  Component Value Date   TSH 1.210 11/07/2020   TSH 2.110 01/16/2020   TSH 3.399 03/07/2013   FREET4 1.13 11/07/2020   FREET4 1.06 01/16/2020     Assessment & Plan:   1. Uncontrolled type 2 diabetes mellitus with hyperglycemia ([place])  - [name] has currently uncontrolled symptomatic type 2 DM since 60 years of age.  She does not monitor blood glucose regularly.  Her previsit labs show A1c of 11.8%, only slightly better than 12.1%.  She did not document any hypoglycemia.     - I had a long discussion with her about the progressive nature of diabetes and the pathology behind its complications. -her diabetes is complicated by obesity, non- engagement for management plan,  history of smoking and she remains at a high risk for more acute and chronic complications which include CAD, CVA, CKD, retinopathy, and neuropathy. These are all discussed in detail with her.  - I have counseled her on diet  and weight management  by adopting a carbohydrate restricted/protein rich diet.  Patient is encouraged to switch to  unprocessed or minimally processed     complex starch and increased protein  intake (animal or plant source), fruits, and vegetables. -  she is advised to stick to a routine mealtimes to eat 3 meals  a day and avoid unnecessary snacks ( to snack only to correct hypoglycemia).   - she acknowledges that there is a room for improvement in her food and drink choices. - Suggestion is made for her to avoid simple carbohydrates  from her diet including Cakes, Sweet Desserts, Ice Cream, Soda (diet and regular), Sweet Tea, Candies, Chips, Cookies, Store Bought Juices, Alcohol in Excess of  1-2 drinks a day, Artificial Sweeteners,  Coffee Creamer, and "Sugar-free" Products, Lemonade. This will help patient to have more stable blood glucose profile and potentially avoid unintended weight gain.   - she has been scheduled with Jearld Fenton, RDN, CDE for diabetes education.  - I have approached her with the following individualized plan to manage  her diabetes and patient agrees:    -In light of her presentation with severe glycemic burden, she will need a higher dose of insulin.  This patient would benefit from multiple daily injections of insulin, however she has a large supply of Toujeo in her house.  She will be allowed to use up this products.  She is advised to increase her Toujeo to 120 units nightly, continue to monitor blood glucose at least twice a day-daily before breakfast and at bedtime.   -This patient will benefit from a CGM.  She will be considered for the freestyle libre device next visit. - she is encouraged to call clinic for blood glucose levels less than 70 or above 200 mg /dl. - she is advised to continue Metformin 500 mg p.o. twice daily, glipizide 5 mg XL p.o. daily at breakfast.  She is not affording Januvia co-pay, advised to discontinue.    - Specific targets for  A1c;  LDL, HDL,  and Triglycerides were discussed with the patient.  2) Blood Pressure /Hypertension:   -Her blood pressure is  not controlled to target.  Amlodipine 5 mg was recently added to her  Hyzaar.  She is advised to continue  losartan/HCTZ 100/25 milligrams p.o. daily.   3) Lipids/Hyperlipidemia:   Review of her recent lipid panel showed  controlled  LDL at 68 .  she  is advised to continue Crestor 20 mg p.o. nightly.  Side effects and precautions discussed with her.    4)  Weight/Diet: Her BMI is 43.27--   clearly complicating her diabetes care.   she is  a candidate for modest weight loss. I discussed with her the fact that loss of 5 - 10% of her  current body weight will have the most impact on her diabetes management.  Exercise, and detailed carbohydrates information provided  -  detailed on discharge instructions.  5) Chronic Care/Health Maintenance:  -she  is on ACEI/ARB and Statin medications and  is encouraged to initiate and continue to follow up with Ophthalmology, Dentist,  Podiatrist at least yearly or according to recommendations, and advised to  stay away from smoking. I have recommended yearly flu vaccine and pneumonia vaccine at least every 5 years; moderate intensity exercise for up to 150 minutes weekly; and  sleep for at least 7 hours a day.  Her ABI was recently normal on April 26, 2020.  - she is  advised to maintain close follow up with Rosita Fire, MD for primary care  needs, as well as her other providers for optimal and coordinated care.     I spent 41 minutes in the care of the patient today including review of labs from [facility], Lipids, Thyroid Function, Hematology (current and previous including abstractions from other facilities); face-to-face time discussing  her blood glucose readings/logs, discussing hypoglycemia and hyperglycemia episodes and symptoms, medications doses, her options of short and long term treatment based on the latest standards of care / guidelines;  discussion about incorporating lifestyle medicine;  and documenting the encounter.    Please refer to Patient Instructions for Blood Glucose Monitoring and Insulin/Medications Dosing  Guide"  in media tab for additional information. Please  also refer to " Patient Self Inventory" in the Media  tab for reviewed elements of pertinent patient history.  [name] participated in the discussions, expressed understanding, and voiced agreement with the above plans.  All questions were answered to her satisfaction. she is encouraged to contact clinic should she have any questions or concerns prior to her return visit.   Follow up plan: - Return in about 3 months (around 02/22/2021) for Bring Meter and Logs- A1c in Office.  Glade Lloyd, MD [facility] [address] [place], [place] [address] Phone: [phone]  Fax: [phone]    11/22/2020, 6:04 PM  This note was partially dictated with voice recognition software. Similar sounding words can be transcribed inadequately or may not  be corrected upon review.

## 2021-03-13 NOTE — Patient Instructions (Signed)

## 2021-04-25 DIAGNOSIS — Z23 Encounter for immunization: Secondary | ICD-10-CM | POA: Diagnosis not present

## 2021-04-25 DIAGNOSIS — I10 Essential (primary) hypertension: Secondary | ICD-10-CM | POA: Diagnosis not present

## 2021-04-25 DIAGNOSIS — J4530 Mild persistent asthma, uncomplicated: Secondary | ICD-10-CM | POA: Diagnosis not present

## 2021-04-25 DIAGNOSIS — E1165 Type 2 diabetes mellitus with hyperglycemia: Secondary | ICD-10-CM | POA: Diagnosis not present

## 2021-04-26 LAB — HEMOGLOBIN A1C: Hemoglobin A1C: 11.3

## 2021-05-05 ENCOUNTER — Other Ambulatory Visit: Payer: Self-pay

## 2021-05-05 ENCOUNTER — Emergency Department (HOSPITAL_COMMUNITY): Payer: BC Managed Care – PPO

## 2021-05-05 ENCOUNTER — Emergency Department (HOSPITAL_COMMUNITY)
Admission: EM | Admit: 2021-05-05 | Discharge: 2021-05-05 | Disposition: A | Discharge status: Home / Self Care | Payer: BC Managed Care – PPO | Attending: Emergency Medicine | Admitting: Emergency Medicine

## 2021-05-05 ENCOUNTER — Encounter (HOSPITAL_COMMUNITY): Payer: Self-pay

## 2021-05-05 DIAGNOSIS — R079 Chest pain, unspecified: Secondary | ICD-10-CM | POA: Diagnosis not present

## 2021-05-05 DIAGNOSIS — R059 Cough, unspecified: Secondary | ICD-10-CM | POA: Diagnosis not present

## 2021-05-05 DIAGNOSIS — I10 Essential (primary) hypertension: Secondary | ICD-10-CM | POA: Diagnosis not present

## 2021-05-05 DIAGNOSIS — Z7951 Long term (current) use of inhaled steroids: Secondary | ICD-10-CM | POA: Diagnosis not present

## 2021-05-05 DIAGNOSIS — R7402 Elevation of levels of lactic acid dehydrogenase (LDH): Secondary | ICD-10-CM | POA: Insufficient documentation

## 2021-05-05 DIAGNOSIS — Z794 Long term (current) use of insulin: Secondary | ICD-10-CM | POA: Diagnosis not present

## 2021-05-05 DIAGNOSIS — Z7982 Long term (current) use of aspirin: Secondary | ICD-10-CM | POA: Diagnosis not present

## 2021-05-05 DIAGNOSIS — Z20822 Contact with and (suspected) exposure to covid-19: Secondary | ICD-10-CM | POA: Diagnosis not present

## 2021-05-05 DIAGNOSIS — Z87891 Personal history of nicotine dependence: Secondary | ICD-10-CM | POA: Diagnosis not present

## 2021-05-05 DIAGNOSIS — Z7984 Long term (current) use of oral hypoglycemic drugs: Secondary | ICD-10-CM | POA: Diagnosis not present

## 2021-05-05 DIAGNOSIS — J101 Influenza due to other identified influenza virus with other respiratory manifestations: Secondary | ICD-10-CM | POA: Insufficient documentation

## 2021-05-05 DIAGNOSIS — J449 Chronic obstructive pulmonary disease, unspecified: Secondary | ICD-10-CM | POA: Diagnosis not present

## 2021-05-05 DIAGNOSIS — Z79899 Other long term (current) drug therapy: Secondary | ICD-10-CM | POA: Diagnosis not present

## 2021-05-05 DIAGNOSIS — R000 Tachycardia, unspecified: Secondary | ICD-10-CM | POA: Insufficient documentation

## 2021-05-05 DIAGNOSIS — E119 Type 2 diabetes mellitus without complications: Secondary | ICD-10-CM | POA: Diagnosis not present

## 2021-05-05 DIAGNOSIS — J45909 Unspecified asthma, uncomplicated: Secondary | ICD-10-CM | POA: Diagnosis not present

## 2021-05-05 DIAGNOSIS — R0602 Shortness of breath: Secondary | ICD-10-CM | POA: Diagnosis not present

## 2021-05-05 LAB — CBC WITH DIFFERENTIAL/PLATELET
Abs Immature Granulocytes: 0.05 10*3/uL (ref 0.00–0.07)
Basophils Absolute: 0 10*3/uL (ref 0.0–0.1)
Basophils Relative: 1 %
Eosinophils Absolute: 0 10*3/uL (ref 0.0–0.5)
Eosinophils Relative: 0 %
HCT: 42.2 % (ref 36.0–46.0)
Hemoglobin: 14 g/dL (ref 12.0–15.0)
Immature Granulocytes: 1 %
Lymphocytes Relative: 8 %
Lymphs Abs: 0.7 10*3/uL (ref 0.7–4.0)
MCH: 27.3 pg (ref 26.0–34.0)
MCHC: 33.2 g/dL (ref 30.0–36.0)
MCV: 82.3 fL (ref 80.0–100.0)
Monocytes Absolute: 0.9 10*3/uL (ref 0.1–1.0)
Monocytes Relative: 10 %
Neutro Abs: 7.1 10*3/uL (ref 1.7–7.7)
Neutrophils Relative %: 80 %
Platelets: 208 10*3/uL (ref 150–400)
RBC: 5.13 MIL/uL — ABNORMAL HIGH (ref 3.87–5.11)
RDW: 14.1 % (ref 11.5–15.5)
WBC: 8.8 10*3/uL (ref 4.0–10.5)
nRBC: 0 % (ref 0.0–0.2)

## 2021-05-05 LAB — COMPREHENSIVE METABOLIC PANEL
ALT: 26 U/L (ref 0–44)
AST: 22 U/L (ref 15–41)
Albumin: 3.8 g/dL (ref 3.5–5.0)
Alkaline Phosphatase: 63 U/L (ref 38–126)
Anion gap: 10 (ref 5–15)
BUN: 12 mg/dL (ref 6–20)
CO2: 26 mmol/L (ref 22–32)
Calcium: 9.5 mg/dL (ref 8.9–10.3)
Chloride: 99 mmol/L (ref 98–111)
Creatinine, Ser: 0.82 mg/dL (ref 0.44–1.00)
GFR, Estimated: >60 mL/min (ref >60)
Glucose, Bld: 281 mg/dL — ABNORMAL HIGH (ref 70–99)
Potassium: 3.7 mmol/L (ref 3.5–5.1)
Sodium: 135 mmol/L (ref 135–145)
Total Bilirubin: 0.2 mg/dL — ABNORMAL LOW (ref 0.3–1.2)
Total Protein: 7.2 g/dL (ref 6.5–8.1)

## 2021-05-05 LAB — LACTIC ACID, PLASMA
Lactic Acid, Venous: 1.8 mmol/L (ref 0.5–1.9)
Lactic Acid, Venous: 2.1 mmol/L (ref 0.5–1.9)

## 2021-05-05 LAB — RESP PANEL BY RT-PCR (FLU A&B, COVID) ARPGX2
Influenza A by PCR: POSITIVE — AB
Influenza B by PCR: NEGATIVE
SARS Coronavirus 2 by RT PCR: NEGATIVE

## 2021-05-05 MED ORDER — SODIUM CHLORIDE 0.9 % IV BOLUS
1000.0000 mL | Freq: Once | INTRAVENOUS | Status: AC
  Administered 2021-05-05: 11:00:00 1000 mL via INTRAVENOUS

## 2021-05-05 NOTE — ED Triage Notes (Signed)
Pt arrived via POV with complaints of SOB and chest pain all over. Hx of COPD, asthma. Pt seen PCP 04/26/21 and was advised to get CXR, but pt has not been to get one.

## 2021-05-05 NOTE — ED Provider Notes (Signed)
[facility] EMERGENCY DEPARTMENT Provider Note   CSN: [id number] Arrival date & time: 05/05/21  4401     History Chief Complaint  Patient presents with   Shortness of Breath   Chest Pain    [name] is a 60 y.o. female.   Shortness of Breath Associated symptoms: chest pain and headaches   Associated symptoms: no abdominal pain and no rash   Chest Pain Associated symptoms: fatigue, headache and shortness of breath   Associated symptoms: no abdominal pain and no weakness   Patient with shortness of breath cough.  Some mild sputum production.  Feels bad.  Fevers began yesterday and worsened today.  Reportedly been seen by PCP 9 days ago and advised an x-ray would not get 1.  Has a dull headache which somewhat unusual for her but does ache all over.  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    Past Medical History:  Diagnosis Date   Allergy    Asthma    COPD (chronic obstructive pulmonary disease) ([place])    Diabetes mellitus without complication ([place])    Hypertension    Morbid obesity due to excess calories [facility])    Umbilical hernia     Patient Active Problem List   Diagnosis Date Noted   Personal history of noncompliance with medical treatment, presenting hazards to health 03/12/2020   Uncontrolled type 2 diabetes mellitus with hyperglycemia ([place]) 05/30/2019   Mixed hyperlipidemia 05/30/2019   Screening for colorectal cancer 03/21/2019   PMB (postmenopausal bleeding) 03/21/2019   Encounter for gynecological examination with Papanicolaou smear of cervix 03/21/2019   Fibroids 03/21/2019   Special screening for malignant neoplasms, colon    Encounter for screening colonoscopy 12/14/2014   Thickened endometrium 03/15/2013   Essential hypertension, benign 03/07/2013   Diabetes mellitus, type II ([place]) 03/07/2013   Asthma 03/07/2013   Postmenopausal bleeding 03/07/2013   Morbid obesity ([place]) 03/07/2013    Past Surgical History:  Procedure Laterality Date    CARPAL TUNNEL RELEASE Right    CHOLECYSTECTOMY     COLONOSCOPY N/A 01/12/2015   Procedure: COLONOSCOPY;  Surgeon: Danie Binder, MD;  [facility];  Service: Endoscopy;  Laterality: N/A;  0830   HYSTEROSCOPY WITH D & C N/A 04/01/2013   Procedure: DILATATION AND CURETTAGE /HYSTEROSCOPY;  Surgeon: Florian Buff, MD;  Location: [facility] ORS;  Service: Gynecology;  Laterality: N/A;   NASAL POLYP SURGERY       OB History     Gravida  2   Para  2   Term  2   Preterm      AB      Living  2      SAB      IAB      Ectopic      Multiple      Live Births  2           Family History  Problem Relation Age of Onset   Diabetes Mother    Kidney disease Mother    Other Mother        renal failure   Hypertension Mother    Cancer Mother        ?uterine cancer    Diabetes Father    Cancer Father        pancreatic   Diabetes Sister    Kidney disease Sister    Diabetes Maternal Grandmother    Asthma Maternal Grandmother    COPD Maternal Grandmother  bronchitis   Hypertension Maternal Grandmother    Thyroid disease Maternal Grandmother    Hypertension Daughter    Colon cancer Neg Hx     Social History   Tobacco Use   Smoking status: Former    Packs/day: 0.50    Years: 20.00    Pack years: 10.00    Types: Cigarettes    Quit date: 03/25/1997    Years since quitting: 24.1   Smokeless tobacco: Never  Vaping Use   Vaping Use: Never used  Substance Use Topics   Alcohol use: Yes    Alcohol/week: 0.0 standard drinks    Comment: Ocassional   Drug use: No    Home Medications Prior to Admission medications   Medication Sig Start Date End Date Taking? Authorizing Provider  amLODipine (NORVASC) 5 MG tablet Take 1 tablet by mouth daily. 10/09/20   [provider]  aspirin EC 81 MG tablet Take 81 mg by mouth daily.    [provider]  B-D UF III MINI PEN NEEDLES 31G X 5 MM MISC USE ONCE DAILY 10/26/20   Cassandria Anger, MD  diclofenac  sodium (VOLTAREN) 1 % GEL 4 (four) times daily as needed. 04/03/19   [provider]  Fluticasone-Salmeterol (ADVAIR) 250-50 MCG/DOSE AEPB Inhale 1 puff into the lungs 2 (two) times daily.     [provider]  gabapentin (NEURONTIN) 100 MG capsule Take 1 capsule by mouth 2 (two) times daily. 11/21/20   [provider]  glipiZIDE (GLUCOTROL XL) 5 MG 24 hr tablet TAKE 1 TABLET BY MOUTH EVERY DAY WITH BREAKFAST 03/13/21   Cassandria Anger, MD  Insulin Glargine (TOUJEO MAX SOLOSTAR [place]) Inject 120 Units into the skin at bedtime.    [provider]  Ipratropium-Albuterol (COMBIVENT RESPIMAT IN) Inhale 1 puff into the lungs daily as needed (shortness of breath).    [provider]  ipratropium-albuterol (DUONEB) 0.5-2.5 (3) MG/3ML SOLN Take 3 mLs by nebulization every 4 (four) hours as needed (Shortness of Breath).     [provider]  loratadine (CLARITIN) 10 MG tablet Take 10 mg by mouth daily. Patient not taking: No sig reported 02/27/20   [provider]  losartan-hydrochlorothiazide (HYZAAR) 100-25 MG tablet Take 1 tablet by mouth daily. 10/10/20   [provider]  metFORMIN (GLUCOPHAGE) 500 MG tablet TAKE 1 TABLET BY MOUTH 2 TIMES DAILY WITH A MEAL. 02/26/21   Cassandria Anger, MD  Multiple Vitamin (MULTIVITAMIN WITH MINERALS) TABS tablet Take 1 tablet by mouth daily.    [provider]  rosuvastatin (CRESTOR) 20 MG tablet Take 20 mg by mouth daily.     [provider]  traMADol (ULTRAM) 50 MG tablet Take 50 mg by mouth 2 (two) times daily. 08/04/15   [provider]    Allergies    Patient has no known allergies.  Review of Systems   Review of Systems  Constitutional:  Positive for chills and fatigue.  HENT:  Positive for congestion.   Respiratory:  Positive for shortness of breath.   Cardiovascular:  Positive for chest pain.  Gastrointestinal:  Negative for abdominal pain.  Genitourinary:   Negative for flank pain.  Musculoskeletal:  Positive for myalgias.  Skin:  Negative for rash.  Neurological:  Positive for headaches. Negative for weakness.  Psychiatric/Behavioral:  Negative for confusion.    Physical Exam Updated Vital Signs BP (!) 165/86   Pulse (!) 110   Temp (!) 101.9 F (38.8 C) (Oral)  Resp 16   Ht 5\' 1"  (1.549 m)   Wt 103.4 kg   LMP 02/25/2019   SpO2 99%   BMI 43.08 kg/m   Physical Exam Vitals and nursing note reviewed.  Constitutional:      Appearance: She is obese.  HENT:     Head: Atraumatic.  Cardiovascular:     Rate and Rhythm: Regular rhythm. Tachycardia present.  Pulmonary:     Comments: Mildly harsh breath sounds. Chest:     Chest wall: No tenderness.  Abdominal:     Tenderness: There is no abdominal tenderness.  Musculoskeletal:     Right lower leg: No edema.     Left lower leg: No edema.  Skin:    General: Skin is warm.     Capillary Refill: Capillary refill takes less than 2 seconds.  Neurological:     Mental Status: She is alert and oriented to person, place, and time.    ED Results / Procedures / Treatments   Labs (all labs ordered are listed, but only abnormal results are displayed) Labs Reviewed  RESP PANEL BY RT-PCR (FLU A&B, COVID) ARPGX2 - Abnormal; Notable for the following components:      Result Value   Influenza A by PCR POSITIVE (*)    All other components within normal limits  CBC WITH DIFFERENTIAL/PLATELET - Abnormal; Notable for the following components:   RBC 5.13 (*)    All other components within normal limits  COMPREHENSIVE METABOLIC PANEL - Abnormal; Notable for the following components:   Glucose, Bld 281 (*)    Total Bilirubin 0.2 (*)    All other components within normal limits  LACTIC ACID, PLASMA - Abnormal; Notable for the following components:   Lactic Acid, Venous 2.1 (*)    All other components within normal limits  CULTURE, BLOOD (ROUTINE X 2)  CULTURE, BLOOD (ROUTINE X 2)  LACTIC ACID,  PLASMA    EKG EKG Interpretation  Date/Time:  Sunday May 05 2021 10:04:33 EST Ventricular Rate:  115 PR Interval:  187 QRS Duration: 76 QT Interval:  302 QTC Calculation: 418 R Axis:   137 Text Interpretation: Right and left arm electrode reversal, interpretation assumes no reversal Sinus tachycardia Consider left atrial enlargement Probable lateral infarct, age indeterminate Confirmed by Davonna Belling [id number]) on 05/05/2021 10:26:07 AM  Radiology DG Chest Portable 1 View  Result Date: 05/05/2021 CLINICAL DATA:  Shortness of breath and chest pain for 2 days, cough, history COPD, asthma EXAM: PORTABLE CHEST 1 VIEW COMPARISON:  08/22/2020 FINDINGS: Normal heart size, mediastinal contours, and pulmonary vascularity. Atherosclerotic calcification aorta. Lungs clear. No pulmonary infiltrate, pleural effusion, or pneumothorax. Osseous structures unremarkable. IMPRESSION: No acute abnormalities. Aortic Atherosclerosis (ICD10-I70.0). Electronically Signed   By: Lavonia Dana M.D.   On: 05/05/2021 11:55    Procedures Procedures   Medications Ordered in ED Medications  sodium chloride 0.9 % bolus 1,000 mL (0 mLs Intravenous Stopped 05/05/21 1222)    ED Course  I have reviewed the triage vital signs and the nursing notes.  Pertinent labs & imaging results that were available during my care of the patient were reviewed by me and considered in my medical decision making (see chart for details).    MDM Rules/Calculators/A&P                           Patient with URI symptoms.  Found to be flu positive.  Has had fevers for 2 days but has  had symptoms for several.  Chest x-ray does not show pneumonia.  Lab work overall reassuring.  Lactic acid mildly elevated but secondary to the flu not considered severe sepsis at this time.  Feels somewhat better.  Will discharge home with outpatient follow-up.  Doubt meningitis. Final Clinical Impression(s) / ED Diagnoses Final diagnoses:   Influenza A    Rx / DC Orders ED Discharge Orders     None        Davonna Belling, MD 05/05/21 [phone]

## 2021-05-05 NOTE — ED Notes (Signed)
O2 sats 89-90% on RA. Pt placed on 2L [place], O2 sats 96%.

## 2021-05-05 NOTE — ED Notes (Signed)
Pt taken off of O2..sats 95% on RA.

## 2021-05-08 ENCOUNTER — Encounter: Payer: Self-pay | Admitting: "Endocrinology

## 2021-05-10 LAB — CULTURE, BLOOD (ROUTINE X 2)
Culture: NO GROWTH
Culture: NO GROWTH
Special Requests: ADEQUATE
Special Requests: ADEQUATE

## 2021-05-18 ENCOUNTER — Other Ambulatory Visit: Payer: Self-pay | Admitting: "Endocrinology

## 2021-05-26 ENCOUNTER — Other Ambulatory Visit: Payer: Self-pay | Admitting: "Endocrinology

## 2021-05-30 DIAGNOSIS — Z794 Long term (current) use of insulin: Secondary | ICD-10-CM | POA: Diagnosis not present

## 2021-05-30 DIAGNOSIS — H524 Presbyopia: Secondary | ICD-10-CM | POA: Diagnosis not present

## 2021-05-30 DIAGNOSIS — Z7984 Long term (current) use of oral hypoglycemic drugs: Secondary | ICD-10-CM | POA: Diagnosis not present

## 2021-05-30 DIAGNOSIS — E119 Type 2 diabetes mellitus without complications: Secondary | ICD-10-CM | POA: Diagnosis not present

## 2021-05-30 LAB — HM DIABETES EYE EXAM

## 2021-05-31 ENCOUNTER — Emergency Department (HOSPITAL_COMMUNITY): Payer: BC Managed Care – PPO

## 2021-05-31 ENCOUNTER — Encounter (HOSPITAL_COMMUNITY): Payer: Self-pay | Admitting: *Deleted

## 2021-05-31 ENCOUNTER — Inpatient Hospital Stay (HOSPITAL_COMMUNITY)
Admission: EM | Admit: 2021-05-31 | Discharge: 2021-06-03 | DRG: 690 | Disposition: A | Discharge status: Home / Self Care | Payer: BC Managed Care – PPO | Attending: Family Medicine | Admitting: Family Medicine

## 2021-05-31 ENCOUNTER — Other Ambulatory Visit: Payer: Self-pay

## 2021-05-31 DIAGNOSIS — I10 Essential (primary) hypertension: Secondary | ICD-10-CM | POA: Diagnosis present

## 2021-05-31 DIAGNOSIS — Z841 Family history of disorders of kidney and ureter: Secondary | ICD-10-CM

## 2021-05-31 DIAGNOSIS — J449 Chronic obstructive pulmonary disease, unspecified: Secondary | ICD-10-CM | POA: Diagnosis not present

## 2021-05-31 DIAGNOSIS — Z794 Long term (current) use of insulin: Secondary | ICD-10-CM | POA: Diagnosis not present

## 2021-05-31 DIAGNOSIS — E782 Mixed hyperlipidemia: Secondary | ICD-10-CM | POA: Diagnosis present

## 2021-05-31 DIAGNOSIS — E1169 Type 2 diabetes mellitus with other specified complication: Secondary | ICD-10-CM | POA: Diagnosis not present

## 2021-05-31 DIAGNOSIS — Z7982 Long term (current) use of aspirin: Secondary | ICD-10-CM | POA: Diagnosis not present

## 2021-05-31 DIAGNOSIS — Z79899 Other long term (current) drug therapy: Secondary | ICD-10-CM | POA: Diagnosis not present

## 2021-05-31 DIAGNOSIS — J45909 Unspecified asthma, uncomplicated: Secondary | ICD-10-CM | POA: Diagnosis present

## 2021-05-31 DIAGNOSIS — Z8249 Family history of ischemic heart disease and other diseases of the circulatory system: Secondary | ICD-10-CM

## 2021-05-31 DIAGNOSIS — G932 Benign intracranial hypertension: Secondary | ICD-10-CM | POA: Diagnosis not present

## 2021-05-31 DIAGNOSIS — Z20822 Contact with and (suspected) exposure to covid-19: Secondary | ICD-10-CM | POA: Diagnosis present

## 2021-05-31 DIAGNOSIS — E1165 Type 2 diabetes mellitus with hyperglycemia: Secondary | ICD-10-CM | POA: Diagnosis present

## 2021-05-31 DIAGNOSIS — N10 Acute tubulo-interstitial nephritis: Principal | ICD-10-CM | POA: Diagnosis present

## 2021-05-31 DIAGNOSIS — R109 Unspecified abdominal pain: Secondary | ICD-10-CM | POA: Diagnosis not present

## 2021-05-31 DIAGNOSIS — Z825 Family history of asthma and other chronic lower respiratory diseases: Secondary | ICD-10-CM

## 2021-05-31 DIAGNOSIS — E871 Hypo-osmolality and hyponatremia: Secondary | ICD-10-CM | POA: Diagnosis present

## 2021-05-31 DIAGNOSIS — R0789 Other chest pain: Secondary | ICD-10-CM | POA: Diagnosis present

## 2021-05-31 DIAGNOSIS — R9431 Abnormal electrocardiogram [ECG] [EKG]: Secondary | ICD-10-CM | POA: Diagnosis not present

## 2021-05-31 DIAGNOSIS — B961 Klebsiella pneumoniae [K. pneumoniae] as the cause of diseases classified elsewhere: Secondary | ICD-10-CM | POA: Diagnosis not present

## 2021-05-31 DIAGNOSIS — E119 Type 2 diabetes mellitus without complications: Secondary | ICD-10-CM

## 2021-05-31 DIAGNOSIS — Z8049 Family history of malignant neoplasm of other genital organs: Secondary | ICD-10-CM

## 2021-05-31 DIAGNOSIS — Z6841 Body Mass Index (BMI) 40.0 and over, adult: Secondary | ICD-10-CM | POA: Diagnosis not present

## 2021-05-31 DIAGNOSIS — G9608 Other cranial cerebrospinal fluid leak: Secondary | ICD-10-CM | POA: Diagnosis not present

## 2021-05-31 DIAGNOSIS — N19 Unspecified kidney failure: Secondary | ICD-10-CM | POA: Diagnosis present

## 2021-05-31 DIAGNOSIS — Z87891 Personal history of nicotine dependence: Secondary | ICD-10-CM

## 2021-05-31 DIAGNOSIS — Z833 Family history of diabetes mellitus: Secondary | ICD-10-CM | POA: Diagnosis not present

## 2021-05-31 DIAGNOSIS — R000 Tachycardia, unspecified: Secondary | ICD-10-CM | POA: Diagnosis not present

## 2021-05-31 DIAGNOSIS — N12 Tubulo-interstitial nephritis, not specified as acute or chronic: Secondary | ICD-10-CM | POA: Diagnosis not present

## 2021-05-31 DIAGNOSIS — R059 Cough, unspecified: Secondary | ICD-10-CM | POA: Diagnosis not present

## 2021-05-31 DIAGNOSIS — R0602 Shortness of breath: Secondary | ICD-10-CM | POA: Diagnosis not present

## 2021-05-31 LAB — CBC WITH DIFFERENTIAL/PLATELET
Abs Immature Granulocytes: 0.07 10*3/uL (ref 0.00–0.07)
Basophils Absolute: 0 10*3/uL (ref 0.0–0.1)
Basophils Relative: 0 %
Eosinophils Absolute: 0 10*3/uL (ref 0.0–0.5)
Eosinophils Relative: 0 %
HCT: 39 % (ref 36.0–46.0)
Hemoglobin: 12.8 g/dL (ref 12.0–15.0)
Immature Granulocytes: 1 %
Lymphocytes Relative: 8 %
Lymphs Abs: 1.1 10*3/uL (ref 0.7–4.0)
MCH: 26.8 pg (ref 26.0–34.0)
MCHC: 32.8 g/dL (ref 30.0–36.0)
MCV: 81.6 fL (ref 80.0–100.0)
Monocytes Absolute: 1.1 10*3/uL — ABNORMAL HIGH (ref 0.1–1.0)
Monocytes Relative: 7 %
Neutro Abs: 12.6 10*3/uL — ABNORMAL HIGH (ref 1.7–7.7)
Neutrophils Relative %: 84 %
Platelets: 327 10*3/uL (ref 150–400)
RBC: 4.78 MIL/uL (ref 3.87–5.11)
RDW: 13.9 % (ref 11.5–15.5)
WBC: 14.9 10*3/uL — ABNORMAL HIGH (ref 4.0–10.5)
nRBC: 0 % (ref 0.0–0.2)

## 2021-05-31 LAB — URINALYSIS, ROUTINE W REFLEX MICROSCOPIC
Bilirubin Urine: NEGATIVE
Glucose, UA: 250 mg/dL — AB
Ketones, ur: 15 mg/dL — AB
Leukocytes,Ua: NEGATIVE
Nitrite: NEGATIVE
Protein, ur: 100 mg/dL — AB
Specific Gravity, Urine: <1.005 — ABNORMAL LOW (ref 1.005–1.030)
pH: 6 (ref 5.0–8.0)

## 2021-05-31 LAB — COMPREHENSIVE METABOLIC PANEL
ALT: 51 U/L — ABNORMAL HIGH (ref 0–44)
AST: 44 U/L — ABNORMAL HIGH (ref 15–41)
Albumin: 2.9 g/dL — ABNORMAL LOW (ref 3.5–5.0)
Alkaline Phosphatase: 131 U/L — ABNORMAL HIGH (ref 38–126)
Anion gap: 13 (ref 5–15)
BUN: 14 mg/dL (ref 6–20)
CO2: 27 mmol/L (ref 22–32)
Calcium: 9.6 mg/dL (ref 8.9–10.3)
Chloride: 91 mmol/L — ABNORMAL LOW (ref 98–111)
Creatinine, Ser: 0.9 mg/dL (ref 0.44–1.00)
GFR, Estimated: >60 mL/min (ref >60)
Glucose, Bld: 353 mg/dL — ABNORMAL HIGH (ref 70–99)
Potassium: 3.7 mmol/L (ref 3.5–5.1)
Sodium: 131 mmol/L — ABNORMAL LOW (ref 135–145)
Total Bilirubin: 0.6 mg/dL (ref 0.3–1.2)
Total Protein: 7.7 g/dL (ref 6.5–8.1)

## 2021-05-31 LAB — BRAIN NATRIURETIC PEPTIDE: B Natriuretic Peptide: 66 pg/mL (ref 0.0–100.0)

## 2021-05-31 LAB — RESP PANEL BY RT-PCR (FLU A&B, COVID) ARPGX2
Influenza A by PCR: NEGATIVE
Influenza B by PCR: NEGATIVE
SARS Coronavirus 2 by RT PCR: NEGATIVE

## 2021-05-31 LAB — LACTIC ACID, PLASMA
Lactic Acid, Venous: 1.5 mmol/L (ref 0.5–1.9)
Lactic Acid, Venous: 1.2 mmol/L (ref 0.5–1.9)

## 2021-05-31 LAB — TROPONIN I (HIGH SENSITIVITY)
Troponin I (High Sensitivity): 12 ng/L (ref <18)
Troponin I (High Sensitivity): 11 ng/L (ref <18)

## 2021-05-31 LAB — GLUCOSE, CAPILLARY
Glucose-Capillary: 408 mg/dL — ABNORMAL HIGH (ref 70–99)
Glucose-Capillary: 439 mg/dL — ABNORMAL HIGH (ref 70–99)

## 2021-05-31 LAB — URINALYSIS, MICROSCOPIC (REFLEX)

## 2021-05-31 LAB — LIPASE, BLOOD: Lipase: 20 U/L (ref 11–51)

## 2021-05-31 MED ORDER — SODIUM CHLORIDE 0.9 % IV SOLN: INTRAVENOUS | Status: DC

## 2021-05-31 MED ORDER — IPRATROPIUM-ALBUTEROL 0.5-2.5 (3) MG/3ML IN SOLN: 3.0000 mL | RESPIRATORY_TRACT | Status: DC | PRN

## 2021-05-31 MED ORDER — FLUTICASONE PROPIONATE 50 MCG/ACT NA SUSP
2.0000 | Freq: Every day | NASAL | Status: DC
  Administered 2021-06-01 – 2021-06-03 (×3): 2 via NASAL
  Filled 2021-05-31: qty 16

## 2021-05-31 MED ORDER — SODIUM CHLORIDE 0.9 % IV SOLN: 2.0000 g | Freq: Once | INTRAVENOUS | Status: DC

## 2021-05-31 MED ORDER — MOMETASONE FURO-FORMOTEROL FUM 200-5 MCG/ACT IN AERO
2.0000 | INHALATION_SPRAY | Freq: Two times a day (BID) | RESPIRATORY_TRACT | Status: DC
  Administered 2021-06-01 – 2021-06-03 (×5): 2 via RESPIRATORY_TRACT
  Filled 2021-05-31: qty 8.8

## 2021-05-31 MED ORDER — ACETAMINOPHEN 325 MG PO TABS
650.0000 mg | ORAL_TABLET | Freq: Four times a day (QID) | ORAL | Status: DC | PRN
  Administered 2021-05-31 – 2021-06-03 (×3): 650 mg via ORAL
  Filled 2021-05-31 (×3): qty 2

## 2021-05-31 MED ORDER — SODIUM CHLORIDE 0.9 % IV SOLN: INTRAVENOUS | Status: AC

## 2021-05-31 MED ORDER — INSULIN ASPART 100 UNIT/ML IJ SOLN
0.0000 [IU] | Freq: Every day | INTRAMUSCULAR | Status: DC
  Administered 2021-05-31: 5 [IU] via SUBCUTANEOUS

## 2021-05-31 MED ORDER — THIAMINE HCL 100 MG PO TABS
100.0000 mg | ORAL_TABLET | Freq: Every day | ORAL | Status: DC
  Administered 2021-06-01 – 2021-06-03 (×3): 100 mg via ORAL
  Filled 2021-05-31 (×3): qty 1

## 2021-05-31 MED ORDER — INSULIN ASPART 100 UNIT/ML IJ SOLN: 0.0000 [IU] | Freq: Three times a day (TID) | INTRAMUSCULAR | Status: DC

## 2021-05-31 MED ORDER — ACETAMINOPHEN 325 MG PO TABS
650.0000 mg | ORAL_TABLET | Freq: Four times a day (QID) | ORAL | Status: DC | PRN
  Administered 2021-05-31: 18:00:00 650 mg via ORAL
  Filled 2021-05-31: qty 2

## 2021-05-31 MED ORDER — ONDANSETRON HCL 4 MG/2ML IJ SOLN
4.0000 mg | Freq: Once | INTRAMUSCULAR | Status: AC
  Administered 2021-05-31: 18:00:00 4 mg via INTRAVENOUS
  Filled 2021-05-31: qty 2

## 2021-05-31 MED ORDER — ROSUVASTATIN CALCIUM 20 MG PO TABS
20.0000 mg | ORAL_TABLET | Freq: Every day | ORAL | Status: DC
  Administered 2021-06-01: 09:00:00 20 mg via ORAL
  Filled 2021-05-31: qty 1

## 2021-05-31 MED ORDER — GABAPENTIN 100 MG PO CAPS
100.0000 mg | ORAL_CAPSULE | Freq: Two times a day (BID) | ORAL | Status: DC
  Administered 2021-05-31 – 2021-06-03 (×6): 100 mg via ORAL
  Filled 2021-05-31 (×6): qty 1

## 2021-05-31 MED ORDER — SODIUM CHLORIDE 0.9 % IV SOLN
2.0000 g | INTRAVENOUS | Status: DC
  Administered 2021-06-01 – 2021-06-02 (×2): 2 g via INTRAVENOUS
  Filled 2021-05-31 (×2): qty 20

## 2021-05-31 MED ORDER — SODIUM CHLORIDE 0.9 % IV BOLUS
1000.0000 mL | Freq: Once | INTRAVENOUS | Status: AC
  Administered 2021-05-31: 18:00:00 1000 mL via INTRAVENOUS

## 2021-05-31 MED ORDER — IOHEXOL 300 MG/ML  SOLN
100.0000 mL | Freq: Once | INTRAMUSCULAR | Status: AC | PRN
  Administered 2021-05-31: 18:00:00 100 mL via INTRAVENOUS

## 2021-05-31 MED ORDER — SODIUM CHLORIDE 0.9 % IV SOLN
1.0000 g | Freq: Once | INTRAVENOUS | Status: AC
  Administered 2021-05-31: 21:00:00 1 g via INTRAVENOUS
  Filled 2021-05-31: qty 10

## 2021-05-31 MED ORDER — INSULIN GLARGINE (2 UNIT DIAL) 300 UNIT/ML ~~LOC~~ SOPN: 90.0000 [IU] | PEN_INJECTOR | Freq: Every day | SUBCUTANEOUS | Status: DC

## 2021-05-31 MED ORDER — OXYCODONE HCL 5 MG PO TABS
5.0000 mg | ORAL_TABLET | Freq: Four times a day (QID) | ORAL | Status: DC | PRN
  Administered 2021-06-01 – 2021-06-03 (×3): 5 mg via ORAL
  Filled 2021-05-31 (×3): qty 1

## 2021-05-31 MED ORDER — ASPIRIN EC 81 MG PO TBEC
81.0000 mg | DELAYED_RELEASE_TABLET | Freq: Every day | ORAL | Status: DC
  Administered 2021-06-01 – 2021-06-03 (×3): 81 mg via ORAL
  Filled 2021-05-31 (×3): qty 1

## 2021-05-31 MED ORDER — INSULIN GLARGINE-YFGN 100 UNIT/ML ~~LOC~~ SOLN
90.0000 [IU] | Freq: Every day | SUBCUTANEOUS | Status: DC
Start: 2021-05-31 — End: 2021-06-01
  Administered 2021-05-31: 90 [IU] via SUBCUTANEOUS
  Filled 2021-05-31 (×3): qty 0.9

## 2021-05-31 MED ORDER — SODIUM CHLORIDE 0.9 % IV SOLN
1.0000 g | Freq: Once | INTRAVENOUS | Status: AC
  Administered 2021-05-31: 1 g via INTRAVENOUS
  Filled 2021-05-31: qty 10

## 2021-05-31 MED ORDER — ACETAMINOPHEN 650 MG RE SUPP: 650.0000 mg | Freq: Four times a day (QID) | RECTAL | Status: DC | PRN

## 2021-05-31 MED ORDER — ENOXAPARIN SODIUM 40 MG/0.4ML IJ SOSY
40.0000 mg | PREFILLED_SYRINGE | INTRAMUSCULAR | Status: DC
  Administered 2021-06-01: 09:00:00 40 mg via SUBCUTANEOUS
  Filled 2021-05-31 (×3): qty 0.4

## 2021-05-31 NOTE — H&P (Signed)
[facility] H&P   Patient Demographics:    [name], is a 60 y.o. female  MRN: [id number]   DOB - 03/29/1961  Admit Date - 05/31/2021  Outpatient Primary MD for the patient is Rosita Fire, MD  Referring MD/NP/PA: PA Loeffler    Patient coming from: home  Chief Complaint  Patient presents with   Chest Pain      HPI:    [name]  is a 60 y.o. female, medical history of asthma, COPD, diabetes mellitus, insulin-dependent, hypertension, morbid obesity, patient presents to ED secondary to multiple complaints, fever, chills, change in color of the urine, as well she does endorse some dyspnea as well, though she does report right flank pain, does report fever and chills for the last couple of days, she reports history of influenza 3 weeks ago, but reports she has not improved much since, denies any history of renal stone, hematuria, as well she does report constipation.  She reports nausea and vomiting. -ED patient was noted to be febrile 100.5, she was noted to have right CVA tenderness, CT abdomen pelvis significant for right pyelonephritis, work-up significant for low sodium at 131, glucose of 353, and white count of 14.9, lactic acid within normal limit at 1.5, she had positive UA as well.  She is started on IV Rocephin and [facility] hospitalist consulted to admit.    Review of systems:    In addition to the HPI above,  She reports fever and chills No Headache, No changes with Vision or hearing, No problems swallowing food or Liquids, She reports nontypical chest pain ,no  Cough  Aminal pain, nausea and vomiting and some constipation No Blood in stool or Urine, No dysuria, No new skin rashes or bruises, No new joints pains-aches,  No new weakness, tingling, numbness in any extremity, No recent weight gain or loss, No polyuria, polydypsia or polyphagia, No significant Mental  Stressors.  A full 10 point Review of Systems was done, except as stated above, all other Review of Systems were negative.   With Past History of the following :    Past Medical History:  Diagnosis Date   Allergy    Asthma    COPD (chronic obstructive pulmonary disease) ([place])    Diabetes mellitus without complication ([place])    Hypertension    Morbid obesity due to excess calories ([place])    Umbilical hernia       Past Surgical History:  Procedure Laterality Date   CARPAL TUNNEL RELEASE Right    CHOLECYSTECTOMY     COLONOSCOPY N/A 01/12/2015   Procedure: COLONOSCOPY;  Surgeon: Danie Binder, MD;  Location: [facility];  Service: Endoscopy;  Laterality: N/A;  0830   HYSTEROSCOPY WITH D & C N/A 04/01/2013   Procedure: DILATATION AND CURETTAGE /HYSTEROSCOPY;  Surgeon: Florian Buff, MD;  Location: [facility];  Service: Gynecology;  Laterality: N/A;  NASAL POLYP SURGERY        Social History:     Social History   Tobacco Use   Smoking status: Former    Packs/day: 0.50    Years: 20.00    Pack years: 10.00    Types: Cigarettes    Quit date: 03/25/1997    Years since quitting: 24.2   Smokeless tobacco: Never  Substance Use Topics   Alcohol use: Yes    Alcohol/week: 0.0 standard drinks    Comment: Ocassional        Family History :     Family History  Problem Relation Age of Onset   Diabetes Mother    Kidney disease Mother    Other Mother        renal failure   Hypertension Mother    Cancer Mother        ?uterine cancer    Diabetes Father    Cancer Father        pancreatic   Diabetes Sister    Kidney disease Sister    Diabetes Maternal Grandmother    Asthma Maternal Grandmother    COPD Maternal Grandmother        bronchitis   Hypertension Maternal Grandmother    Thyroid disease Maternal Grandmother    Hypertension Daughter    Colon cancer Neg Hx      Home Medications:   Prior to Admission medications   Medication Sig Start Date End Date Taking?  Authorizing Provider  amLODipine (NORVASC) 5 MG tablet Take 1 tablet by mouth daily. 10/09/20  Yes [provider]  aspirin EC 81 MG tablet Take 81 mg by mouth daily.   Yes [provider]  diclofenac sodium (VOLTAREN) 1 % GEL 4 (four) times daily as needed. 04/03/19  Yes [provider]  fluticasone (FLONASE) 50 MCG/ACT nasal spray Place 2 sprays into both nostrils daily. 05/21/21  Yes [provider]  Fluticasone-Salmeterol (ADVAIR) 250-50 MCG/DOSE AEPB Inhale 1 puff into the lungs 2 (two) times daily.    Yes [provider]  furosemide (LASIX) 20 MG tablet Take 20 mg by mouth daily. 05/21/21  Yes [provider]  gabapentin (NEURONTIN) 100 MG capsule Take 1 capsule by mouth 2 (two) times daily. 11/21/20  Yes [provider]  glipiZIDE (GLUCOTROL XL) 5 MG 24 hr tablet TAKE 1 TABLET BY MOUTH EVERY DAY WITH BREAKFAST 03/13/21  Yes Nida, Marella Chimes, MD  insulin glargine, 2 Unit Dial, (TOUJEO MAX SOLOSTAR) 300 UNIT/ML Solostar Pen Inject 120 Units into the skin at bedtime. 05/20/21  Yes Nida, Marella Chimes, MD  Ipratropium-Albuterol (COMBIVENT RESPIMAT IN) Inhale 1 puff into the lungs daily as needed (shortness of breath).   Yes [provider]  ipratropium-albuterol (DUONEB) 0.5-2.5 (3) MG/3ML SOLN Take 3 mLs by nebulization every 4 (four) hours as needed (Shortness of Breath).    Yes [provider]  losartan-hydrochlorothiazide (HYZAAR) 100-25 MG tablet Take 1 tablet by mouth daily. 10/10/20  Yes [provider]  metFORMIN (GLUCOPHAGE) 500 MG tablet TAKE 1 TABLET BY MOUTH 2 TIMES DAILY WITH A MEAL. 05/27/21  Yes Cassandria Anger, MD  Multiple Vitamin (MULTIVITAMIN WITH MINERALS) TABS tablet Take 1 tablet by mouth daily.   Yes [provider]  rosuvastatin (CRESTOR) 20 MG tablet Take 20 mg by mouth daily.    Yes [provider]  thiamine 100 MG tablet Take 1 tablet by mouth daily. 05/16/21  06/15/21 Yes [provider]  traMADol (ULTRAM) 50 MG tablet  Take 50 mg by mouth 2 (two) times daily. 08/04/15  Yes [provider]  B-D UF III MINI PEN NEEDLES 31G X 5 MM MISC USE ONCE DAILY 10/26/20   Cassandria Anger, MD     Allergies:    No Known Allergies   Physical Exam:   Vitals  Blood pressure (!) 122/47, pulse (!) 108, temperature (!) 100.5 F (38.1 C), temperature source Oral, resp. rate 20, last menstrual period 02/25/2019, SpO2 91 %.   1. General well developed female, laying in bed, no apparent distress  2. Normal affect and insight, Not Suicidal or Homicidal, Awake Alert, Oriented X 3.  3. No F.N deficits, ALL C.Nerves Intact, Strength 5/5 all 4 extremities, Sensation intact all 4 extremities, Plantars down going.  4. Ears and Eyes appear Normal, Conjunctivae clear, PERRLA. Moist Oral Mucosa.  5. Supple Neck, No JVD, No cervical lymphadenopathy appriciated, No Carotid Bruits.  6. Symmetrical Chest wall movement, Good air movement bilaterally, CTAB.  7. RRR, No Gallops, Rubs or Murmurs, No Parasternal Heave.  8. Positive Bowel Sounds, right CVA tenderness to palpation, No organomegaly appriciated,No rebound -guarding or rigidity.  9.  No Cyanosis, Normal Skin Turgor, No Skin Rash or Bruise.  10. Good muscle tone,  joints appear normal , no effusions, Normal ROM.  11. No Palpable Lymph Nodes in Neck or Axillae     Data Review:    CBC Recent Labs  Lab 05/31/21 1702  WBC 14.9*  HGB 12.8  HCT 39.0  PLT 327  MCV 81.6  MCH 26.8  MCHC 32.8  RDW 13.9  LYMPHSABS 1.1  MONOABS 1.1*  EOSABS 0.0  BASOSABS 0.0   ------------------------------------------------------------------------------------------------------------------  Chemistries  Recent Labs  Lab 05/31/21 1702  NA 131*  K 3.7  CL 91*  CO2 27  GLUCOSE 353*  BUN 14  CREATININE 0.90  CALCIUM 9.6  AST 44*  ALT 51*  ALKPHOS 131*  BILITOT 0.6    ------------------------------------------------------------------------------------------------------------------ CrCl cannot be calculated (Unknown ideal weight.). ------------------------------------------------------------------------------------------------------------------ No results for input(s): TSH, T4TOTAL, T3FREE, THYROIDAB in the last 72 hours.  Invalid input(s): FREET3  Coagulation profile No results for input(s): INR, PROTIME in the last 168 hours. ------------------------------------------------------------------------------------------------------------------- No results for input(s): DDIMER in the last 72 hours. -------------------------------------------------------------------------------------------------------------------  Cardiac Enzymes No results for input(s): CKMB, TROPONINI, MYOGLOBIN in the last 168 hours.  Invalid input(s): CK ------------------------------------------------------------------------------------------------------------------    Component Value Date/Time   BNP 66.0 05/31/2021 1702     ---------------------------------------------------------------------------------------------------------------  Urinalysis    Component Value Date/Time   COLORURINE YELLOW 05/31/2021 1848   APPEARANCEUR CLEAR 05/31/2021 1848   LABSPEC <1.005 (L) 05/31/2021 1848   PHURINE 6.0 05/31/2021 1848   GLUCOSEU 250 (A) 05/31/2021 1848   HGBUR MODERATE (A) 05/31/2021 1848   BILIRUBINUR NEGATIVE 05/31/2021 1848   BILIRUBINUR negative 12/28/2020 1732   KETONESUR 15 (A) 05/31/2021 1848   PROTEINUR 100 (A) 05/31/2021 1848   UROBILINOGEN 1.0 12/28/2020 1732   UROBILINOGEN 0.2 03/25/2013 1445   NITRITE NEGATIVE 05/31/2021 1848   LEUKOCYTESUR NEGATIVE 05/31/2021 1848    ----------------------------------------------------------------------------------------------------------------   Imaging Results:    DG Chest 2 View  Result Date: 05/31/2021 CLINICAL  DATA:  Cough, congestion, shortness of breath, positive for flu at the end of November, history asthma EXAM: CHEST - 2 VIEW COMPARISON:  05/05/2021 FINDINGS: Normal heart size, mediastinal contours, and pulmonary vascularity. Atherosclerotic calcification aorta. Lungs clear. No infiltrate, pleural effusion, or pneumothorax. Minimal endplate spur formation thoracic spine. IMPRESSION: No acute abnormalities. Aortic Atherosclerosis (  ICD10-I70.0). Electronically Signed   By: Lavonia Dana M.D.   On: 05/31/2021 18:44   CT Abdomen Pelvis W Contrast  Result Date: 05/31/2021 CLINICAL DATA:  RIGHT-sided abdominal pain.  Nausea and constipation EXAM: CT ABDOMEN AND PELVIS WITH CONTRAST TECHNIQUE: Multidetector CT imaging of the abdomen and pelvis was performed using the standard protocol following bolus administration of intravenous contrast. CONTRAST:  124mL OMNIPAQUE IOHEXOL 300 MG/ML  SOLN COMPARISON:  None. FINDINGS: Lower chest: Lung bases are clear. Hepatobiliary: No focal hepatic lesion. Postcholecystectomy. No biliary dilatation. Pancreas: Pancreas is normal. No ductal dilatation. No pancreatic inflammation. Spleen: Normal spleen Adrenals/urinary tract: Thickened adrenal glands is favored adrenal hyperplasia. There is heterogeneous enhancement of the lower pole of the RIGHT kidney (image 43/series 2). This involves approximately 4 cm by 2 cm focus of cortex in lower pole. There is perinephric stranding surrounding the lower pole . There is a small subcapsular fluid collection at the lower RIGHT pole (image 70/series 5). Findings are most suggestive of pyelonephritis lower pole RIGHT kidney. There is additional subtle hypoperfusion in the cortex of the upper pole as seen on coronal image 74/series 5. Ureters normal.  Bladder normal. Stomach/Bowel: Stomach, small bowel, appendix, and cecum are normal. The colon and rectosigmoid colon are normal. Vascular/Lymphatic: Abdominal aorta is normal caliber with  atherosclerotic calcification. There is no retroperitoneal or periportal lymphadenopathy. No pelvic lymphadenopathy. Reproductive: Lobular in enlargement of the uterine fundus suggest underlying leiomyomas. Other: Midline ventral hernia just superior to the umbilicus contains peritoneal fat Musculoskeletal: No aggressive osseous lesion. IMPRESSION: Findings most consistent with focal pyelonephritis in the lower pole of the RIGHT kidney. More subtle generalized pyelonephritis of the upper pole the RIGHT kidney. Electronically Signed   By: Suzy Bouchard M.D.   On: 05/31/2021 18:36   Vent. rate 110 BPM PR interval 177 ms QRS duration 77 ms QT/QTcB 300/406 ms P-R-T axes 66 29 47 Sinus tachycardia LAE, consider biatrial enlargement Abnormal R-wave progression, early transition    Assessment & Plan:    Principal Problem:   Acute pyelonephritis Active Problems:   Essential hypertension, benign   Diabetes mellitus, type II (HCC)   Asthma   Morbid obesity ([place])   Uncontrolled type 2 diabetes mellitus with hyperglycemia ([place])   Mixed hyperlipidemia  Acute right pyelonephritis -Patient presents with right flank pain, fever, leukocytosis, imaging significant for acute pyelonephritis, will send blood cultures, follow-up urine cultures, continue with Rocephin.  Diabetes mellitus, type II, insulin-dependent -Hold oral hypoglycemic agent, continue with Toujeo at a lower dose 120> 19, and will add insulin sliding scale  Nontypical chest pain -EKG nonacute, troponins negative x2  Hypertension -Blood pressure initially soft on presentation, will hold home medications for now  Hyperlipidemia -Continue with home dose statin  History of asthma/COPD -No active wheezing, continue home medications and as needed albuterol  Obesity  Uremia - Continue with normal saline hyponatremia   DVT Prophylaxis  Lovenox   AM Labs Ordered, also please review Full Orders  Family Communication:  Admission, patients condition and plan of care including tests being ordered have been discussed with the patient who indicate understanding and agree with the plan and Code Status.  Code Status full  Likely DC to  home  Condition GUARDED    Consults called: none    Admission status: inpatient    Time spent in minutes : 55 minutes   Phillips Climes M.D on 05/31/2021 at 9:54 PM   [facility] - Office  [phone]

## 2021-05-31 NOTE — ED Triage Notes (Signed)
MULTIPLE COMPLAINTS, DIAGNOSED WITH FLU LAST MONTH. C/O CHEST PAIN , ABDOMINAL PAIN AND NAUSEA. ALSO HAS CONSTIPATION

## 2021-05-31 NOTE — ED Provider Notes (Signed)
[facility] Provider Note   CSN: [id number] Arrival date & time: 05/31/21  1621     History Chief Complaint  Patient presents with   Chest Pain    [name] is a 60 y.o. female. Presents to the emergency department with multiple complaints.  She states that she was diagnosed with flu about 3 weeks ago.  She really has not improved much since then, and feels like she is gradually worsened.  She states that her cough is gotten worse over the past week with some white mucus production.  She has felt worsening shortness of breath.  She has had fevers at home over the past 3 days as well.  She also has associated chest tightness that is worse when taking a deep breath and with a cough.  Patient also complains that she has not had a bowel movement in 9 days.  She is passing gas infrequently.  She states over the last 3 days, she has developed nausea and vomiting.  She has generalized abdominal pain.  She has been unable to keep down p.o. intake.  Of note, she states she is a diabetic and has not taken her insulin today.   Chest Pain Associated symptoms: abdominal pain, cough, fever, nausea, shortness of breath and vomiting       Past Medical History:  Diagnosis Date   Allergy    Asthma    COPD (chronic obstructive pulmonary disease) ([place])    Diabetes mellitus without complication ([place])    Hypertension    Morbid obesity due to excess calories ([place])    Umbilical hernia     Patient Active Problem List   Diagnosis Date Noted   Acute pyelonephritis 05/31/2021   Personal history of noncompliance with medical treatment, presenting hazards to health 03/12/2020   Uncontrolled type 2 diabetes mellitus with hyperglycemia ([place]) 05/30/2019   Mixed hyperlipidemia 05/30/2019   Screening for colorectal cancer 03/21/2019   PMB (postmenopausal bleeding) 03/21/2019   Encounter for gynecological examination with Papanicolaou smear of cervix 03/21/2019   Fibroids  03/21/2019   Special screening for malignant neoplasms, colon    Encounter for screening colonoscopy 12/14/2014   Thickened endometrium 03/15/2013   Essential hypertension, benign 03/07/2013   Diabetes mellitus, type II ([place]) 03/07/2013   Asthma 03/07/2013   Postmenopausal bleeding 03/07/2013   Morbid obesity ([place]) 03/07/2013    Past Surgical History:  Procedure Laterality Date   CARPAL TUNNEL RELEASE Right    CHOLECYSTECTOMY     COLONOSCOPY N/A 01/12/2015   Procedure: COLONOSCOPY;  Surgeon: Danie Binder, MD;  Location: [facility];  Service: Endoscopy;  Laterality: N/A;  0830   HYSTEROSCOPY WITH D & C N/A 04/01/2013   Procedure: DILATATION AND CURETTAGE /HYSTEROSCOPY;  Surgeon: Florian Buff, MD;  Location: [facility];  Service: Gynecology;  Laterality: N/A;   NASAL POLYP SURGERY       OB History     Gravida  2   Para  2   Term  2   Preterm      AB      Living  2      SAB      IAB      Ectopic      Multiple      Live Births  2           Family History  Problem Relation Age of Onset   Diabetes Mother    Kidney disease Mother    Other Mother  renal failure   Hypertension Mother    Cancer Mother        ?uterine cancer    Diabetes Father    Cancer Father        pancreatic   Diabetes Sister    Kidney disease Sister    Diabetes Maternal Grandmother    Asthma Maternal Grandmother    COPD Maternal Grandmother        bronchitis   Hypertension Maternal Grandmother    Thyroid disease Maternal Grandmother    Hypertension Daughter    Colon cancer Neg Hx     Social History   Tobacco Use   Smoking status: Former    Packs/day: 0.50    Years: 20.00    Pack years: 10.00    Types: Cigarettes    Quit date: 03/25/1997    Years since quitting: 24.2   Smokeless tobacco: Never  Vaping Use   Vaping Use: Never used  Substance Use Topics   Alcohol use: Yes    Alcohol/week: 0.0 standard drinks    Comment: Ocassional   Drug use: No    Home  Medications Prior to Admission medications   Medication Sig Start Date End Date Taking? Authorizing Provider  amLODipine (NORVASC) 5 MG tablet Take 1 tablet by mouth daily. 10/09/20  Yes [provider]  aspirin EC 81 MG tablet Take 81 mg by mouth daily.   Yes [provider]  diclofenac sodium (VOLTAREN) 1 % GEL 4 (four) times daily as needed. 04/03/19  Yes [provider]  fluticasone (FLONASE) 50 MCG/ACT nasal spray Place 2 sprays into both nostrils daily. 05/21/21  Yes [provider]  Fluticasone-Salmeterol (ADVAIR) 250-50 MCG/DOSE AEPB Inhale 1 puff into the lungs 2 (two) times daily.    Yes [provider]  furosemide (LASIX) 20 MG tablet Take 20 mg by mouth daily. 05/21/21  Yes [provider]  gabapentin (NEURONTIN) 100 MG capsule Take 1 capsule by mouth 2 (two) times daily. 11/21/20  Yes [provider]  glipiZIDE (GLUCOTROL XL) 5 MG 24 hr tablet TAKE 1 TABLET BY MOUTH EVERY DAY WITH BREAKFAST 03/13/21  Yes Nida, Marella Chimes, MD  insulin glargine, 2 Unit Dial, (TOUJEO MAX SOLOSTAR) 300 UNIT/ML Solostar Pen Inject 120 Units into the skin at bedtime. 05/20/21  Yes Nida, Marella Chimes, MD  Ipratropium-Albuterol (COMBIVENT RESPIMAT IN) Inhale 1 puff into the lungs daily as needed (shortness of breath).   Yes [provider]  ipratropium-albuterol (DUONEB) 0.5-2.5 (3) MG/3ML SOLN Take 3 mLs by nebulization every 4 (four) hours as needed (Shortness of Breath).    Yes [provider]  losartan-hydrochlorothiazide (HYZAAR) 100-25 MG tablet Take 1 tablet by mouth daily. 10/10/20  Yes [provider]  metFORMIN (GLUCOPHAGE) 500 MG tablet TAKE 1 TABLET BY MOUTH 2 TIMES DAILY WITH A MEAL. 05/27/21  Yes Cassandria Anger, MD  Multiple Vitamin (MULTIVITAMIN WITH MINERALS) TABS tablet Take 1 tablet by mouth daily.   Yes [provider]  rosuvastatin (CRESTOR) 20 MG tablet Take 20 mg by mouth daily.     Yes [provider]  thiamine 100 MG tablet Take 1 tablet by mouth daily. 05/16/21 06/15/21 Yes [provider]  traMADol (ULTRAM) 50 MG tablet Take 50 mg by mouth 2 (two) times daily. 08/04/15  Yes [provider]  B-D UF III MINI PEN NEEDLES 31G X 5 MM MISC USE ONCE DAILY 10/26/20   Cassandria Anger, MD    Allergies    Patient has  no known allergies.  Review of Systems   Review of Systems  Constitutional:  Positive for appetite change, chills and fever.  HENT:  Positive for postnasal drip.   Respiratory:  Positive for cough, chest tightness and shortness of breath.   Cardiovascular:  Positive for chest pain.  Gastrointestinal:  Positive for abdominal pain, constipation, nausea and vomiting. Negative for abdominal distention and blood in stool.  All other systems reviewed and are negative.  Physical Exam Updated Vital Signs BP (!) 122/47    Pulse (!) 108    Temp (!) 100.5 F (38.1 C) (Oral)    Resp 20    LMP 02/25/2019    SpO2 91%   Physical Exam Vitals and nursing note reviewed.  Constitutional:      General: She is not in acute distress.    Appearance: Normal appearance. She is obese. She is not ill-appearing, toxic-appearing or diaphoretic.  HENT:     Head: Normocephalic and atraumatic.     Nose: No nasal deformity.     Mouth/Throat:     Lips: Pink. No lesions.     Mouth: No injury, lacerations, oral lesions or angioedema.     Pharynx: Uvula midline. No pharyngeal swelling or uvula swelling.  Eyes:     General: Gaze aligned appropriately. No scleral icterus.       Right eye: No discharge.        Left eye: No discharge.     Conjunctiva/sclera: Conjunctivae normal.     Right eye: Right conjunctiva is not injected. No exudate or hemorrhage.    Left eye: Left conjunctiva is not injected. No exudate or hemorrhage. Neck:     Trachea: No tracheal deviation.  Cardiovascular:     Rate and Rhythm: Regular rhythm. Tachycardia present.     Pulses:  Normal pulses.          Radial pulses are 2+ on the right side and 2+ on the left side.       Dorsalis pedis pulses are 2+ on the right side and 2+ on the left side.     Heart sounds: Normal heart sounds, S1 normal and S2 normal. Heart sounds not distant. No murmur heard.   No friction rub. No gallop. No S3 or S4 sounds.  Pulmonary:     Effort: Pulmonary effort is normal. No tachypnea, accessory muscle usage or respiratory distress.     Breath sounds: No stridor. Decreased breath sounds present. No wheezing, rhonchi or rales.     Comments: Patient with diminished breath sounds likely secondary to body habitus. Chest:     Chest wall: No tenderness.  Abdominal:     General: Abdomen is flat. There is no distension.     Palpations: Abdomen is soft. There is no mass or pulsatile mass.     Tenderness: There is abdominal tenderness in the right upper quadrant, epigastric area and periumbilical area. There is no guarding or rebound. Negative signs include Murphy's sign.     Hernia: A hernia is present. Hernia is present in the umbilical area.  Musculoskeletal:     Cervical back: Neck supple.     Right lower leg: No edema.     Left lower leg: No edema.  Skin:    General: Skin is warm and dry.     Coloration: Skin is not jaundiced or pale.     Findings: No bruising, erythema, lesion or rash.  Neurological:     General: No focal deficit present.  Mental Status: She is alert and oriented to person, place, and time.     GCS: GCS eye subscore is 4. GCS verbal subscore is 5. GCS motor subscore is 6.  Psychiatric:        Mood and Affect: Mood normal.        Behavior: Behavior normal. Behavior is cooperative.    ED Results / Procedures / Treatments   Labs (all labs ordered are listed, but only abnormal results are displayed) Labs Reviewed  CBC WITH DIFFERENTIAL/PLATELET - Abnormal; Notable for the following components:      Result Value   WBC 14.9 (*)    Neutro Abs 12.6 (*)    Monocytes  Absolute 1.1 (*)    All other components within normal limits  COMPREHENSIVE METABOLIC PANEL - Abnormal; Notable for the following components:   Sodium 131 (*)    Chloride 91 (*)    Glucose, Bld 353 (*)    Albumin 2.9 (*)    AST 44 (*)    ALT 51 (*)    Alkaline Phosphatase 131 (*)    All other components within normal limits  URINALYSIS, ROUTINE W REFLEX MICROSCOPIC - Abnormal; Notable for the following components:   Specific Gravity, Urine <1.005 (*)    Glucose, UA 250 (*)    Hgb urine dipstick MODERATE (*)    Ketones, ur 15 (*)    Protein, ur 100 (*)    All other components within normal limits  URINALYSIS, MICROSCOPIC (REFLEX) - Abnormal; Notable for the following components:   Bacteria, UA MANY (*)    All other components within normal limits  RESP PANEL BY RT-PCR (FLU A&B, COVID) ARPGX2  URINE CULTURE  CULTURE, BLOOD (ROUTINE X 2)  CULTURE, BLOOD (ROUTINE X 2)  BRAIN NATRIURETIC PEPTIDE  LIPASE, BLOOD  LACTIC ACID, PLASMA  LACTIC ACID, PLASMA  HEMOGLOBIN A1C  TROPONIN I (HIGH SENSITIVITY)  TROPONIN I (HIGH SENSITIVITY)    EKG EKG Interpretation  Date/Time:  Friday May 31 2021 18:54:55 EST Ventricular Rate:  110 PR Interval:  177 QRS Duration: 77 QT Interval:  300 QTC Calculation: 406 R Axis:   29 Text Interpretation: Sinus tachycardia LAE, consider biatrial enlargement Abnormal R-wave progression, early transition Confirmed by Nanda Quinton [phone]) on 05/31/2021 7:00:16 PM  Radiology DG Chest 2 View  Result Date: 05/31/2021 CLINICAL DATA:  Cough, congestion, shortness of breath, positive for flu at the end of November, history asthma EXAM: CHEST - 2 VIEW COMPARISON:  05/05/2021 FINDINGS: Normal heart size, mediastinal contours, and pulmonary vascularity. Atherosclerotic calcification aorta. Lungs clear. No infiltrate, pleural effusion, or pneumothorax. Minimal endplate spur formation thoracic spine. IMPRESSION: No acute abnormalities. Aortic Atherosclerosis  (ICD10-I70.0). Electronically Signed   By: Lavonia Dana M.D.   On: 05/31/2021 18:44   CT Abdomen Pelvis W Contrast  Result Date: 05/31/2021 CLINICAL DATA:  RIGHT-sided abdominal pain.  Nausea and constipation EXAM: CT ABDOMEN AND PELVIS WITH CONTRAST TECHNIQUE: Multidetector CT imaging of the abdomen and pelvis was performed using the standard protocol following bolus administration of intravenous contrast. CONTRAST:  158mL OMNIPAQUE IOHEXOL 300 MG/ML  SOLN COMPARISON:  None. FINDINGS: Lower chest: Lung bases are clear. Hepatobiliary: No focal hepatic lesion. Postcholecystectomy. No biliary dilatation. Pancreas: Pancreas is normal. No ductal dilatation. No pancreatic inflammation. Spleen: Normal spleen Adrenals/urinary tract: Thickened adrenal glands is favored adrenal hyperplasia. There is heterogeneous enhancement of the lower pole of the RIGHT kidney (image 43/series 2). This involves approximately 4 cm by 2 cm focus of cortex in  lower pole. There is perinephric stranding surrounding the lower pole . There is a small subcapsular fluid collection at the lower RIGHT pole (image 70/series 5). Findings are most suggestive of pyelonephritis lower pole RIGHT kidney. There is additional subtle hypoperfusion in the cortex of the upper pole as seen on coronal image 74/series 5. Ureters normal.  Bladder normal. Stomach/Bowel: Stomach, small bowel, appendix, and cecum are normal. The colon and rectosigmoid colon are normal. Vascular/Lymphatic: Abdominal aorta is normal caliber with atherosclerotic calcification. There is no retroperitoneal or periportal lymphadenopathy. No pelvic lymphadenopathy. Reproductive: Lobular in enlargement of the uterine fundus suggest underlying leiomyomas. Other: Midline ventral hernia just superior to the umbilicus contains peritoneal fat Musculoskeletal: No aggressive osseous lesion. IMPRESSION: Findings most consistent with focal pyelonephritis in the lower pole of the RIGHT kidney. More  subtle generalized pyelonephritis of the upper pole the RIGHT kidney. Electronically Signed   By: Suzy Bouchard M.D.   On: 05/31/2021 18:36    Procedures Procedures   Medications Ordered in ED Medications  acetaminophen (TYLENOL) tablet 650 mg (650 mg Oral Given 05/31/21 1810)  insulin aspart (novoLOG) injection 0-5 Units (has no administration in time range)  insulin aspart (novoLOG) injection 0-15 Units (has no administration in time range)  cefTRIAXone (ROCEPHIN) 2 g in sodium chloride 0.9 % 100 mL IVPB (has no administration in time range)  cefTRIAXone (ROCEPHIN) 1 g in sodium chloride 0.9 % 100 mL IVPB (has no administration in time range)  0.9 %  sodium chloride infusion (has no administration in time range)  insulin glargine-yfgn (SEMGLEE) injection 90 Units (has no administration in time range)  sodium chloride 0.9 % bolus 1,000 mL (0 mLs Intravenous Stopped 05/31/21 2000)  ondansetron (ZOFRAN) injection 4 mg (4 mg Intravenous Given 05/31/21 1810)  iohexol (OMNIPAQUE) 300 MG/ML solution 100 mL (100 mLs Intravenous Contrast Given 05/31/21 1804)  cefTRIAXone (ROCEPHIN) 1 g in sodium chloride 0.9 % 100 mL IVPB (0 g Intravenous Stopped 05/31/21 2105)    ED Course  I have reviewed the triage vital signs and the nursing notes.  Pertinent labs & imaging results that were available during my care of the patient were reviewed by me and considered in my medical decision making (see chart for details).  Clinical Course as of 05/31/21 2233  Fri May 31, 2021  1846 CT concerning for pyelo [GL]    Clinical Course User Index [GL] Sherre Poot Adora Fridge, PA-C   MDM Rules/Calculators/A&P                           This is a 60 y.o. female with a PMH of hypertension, asthma, morbid [place] obesity, diabetes mellitus, umbilical hernia, hypertension, COPD, status postcholecystectomy in the 1990s Who presents to the ED with multiple complaints.  She had the flu about 3 weeks ago and is slowly gotten  worse.  She developed worsening productive cough, fevers, shortness of breath, chest tightness over the past couple of days.  She also states that she has not had a bowel movement in 9 days and has had worsening abdominal pain.  She is started developing nausea and vomiting whenever she eats over the past 3 days.  She has been unable to tolerate p.o. intake.  Review of Past Records: It appears that patient was seen in the ED on November 27 with flulike symptoms.  She was found to be flu positive.  Other work-up was unrevealing.  She was discharged home with supportive treatment.  Vitals: Febrile to 100.5.  Tachycardic to 113.  Tachycardia has been persistent.  Vitals otherwise stable.  Oxygenating well on room air.  Exam: Lung sounds are diminished.  No increased work of breathing.  Productive cough with sputum evaluated that is white and thick.  Patient has moderate abdominal tenderness to palpation that is located mostly in the right upper quadrant.  She is wincing when I pressed on this.  Initial Impression:  Concern for developing pneumonia.   There is also concern that she could have a bowel obstruction given no bowel movements, worsening abdominal pain, and worsening nausea and vomiting.  Patient is likely dehydrated or has some underlying infection with a fever which is probably causing her tachycardia.  I personally reviewed all laboratory work and imaging. Abnormal results outlined below. Leukocytosis to 14.9.  She does have minimal LFT elevation.  Blood sugars elevated to 553, however no anion gap or acidosis.  She is mildly hyponatremic but this should be corrected to normal with the elevated glucose.  CT is concerning for developing pyelonephritis on the right side.  Urine notable for many bacteria.  Her lactate is normal.  Troponins are normal.  Respiratory panel negative.  Chest x-ray without signs of pneumonia.  BNP normal.   Impression: Concern for pyelonephritis.  Patient's right  upper quadrant pain could be resulting from this as she has a larger habitus.  Patient continues to be tachycardic even after Tylenol and fluid administration.  I discussed with patient possibility of being admitted versus discharging home.  I think it would be a good idea if she stayed here for an IV antibiotics given persistent tachycardia and comorbidity burden.  Events/Interventions: IVF, Rocephin  Dr. [facility] will admit patient to service  Dispo Plan: admit for IV abx  I have seen and evaluated this patient in conjunction with my attending physician who agrees and has made changes to the plan accordingly.  Portions of this note were generated with [employer]. Dictation errors may occur despite best attempts at proofreading.   Final Clinical Impression(s) / ED Diagnoses Final diagnoses:  Pyelonephritis    Rx / DC Orders ED Discharge Orders     None        Adolphus Birchwood, PA-C 05/31/21 2233    Margette Fast, MD 06/04/21 [phone]

## 2021-06-01 ENCOUNTER — Encounter (HOSPITAL_COMMUNITY): Payer: Self-pay | Admitting: Internal Medicine

## 2021-06-01 DIAGNOSIS — N10 Acute tubulo-interstitial nephritis: Secondary | ICD-10-CM | POA: Diagnosis not present

## 2021-06-01 DIAGNOSIS — E782 Mixed hyperlipidemia: Secondary | ICD-10-CM

## 2021-06-01 DIAGNOSIS — E1165 Type 2 diabetes mellitus with hyperglycemia: Secondary | ICD-10-CM | POA: Diagnosis not present

## 2021-06-01 DIAGNOSIS — I10 Essential (primary) hypertension: Secondary | ICD-10-CM

## 2021-06-01 LAB — BASIC METABOLIC PANEL
Anion gap: 9 (ref 5–15)
BUN: 14 mg/dL (ref 6–20)
CO2: 29 mmol/L (ref 22–32)
Calcium: 8.8 mg/dL — ABNORMAL LOW (ref 8.9–10.3)
Chloride: 96 mmol/L — ABNORMAL LOW (ref 98–111)
Creatinine, Ser: 0.76 mg/dL (ref 0.44–1.00)
GFR, Estimated: >60 mL/min (ref >60)
Glucose, Bld: 295 mg/dL — ABNORMAL HIGH (ref 70–99)
Potassium: 3.9 mmol/L (ref 3.5–5.1)
Sodium: 134 mmol/L — ABNORMAL LOW (ref 135–145)

## 2021-06-01 LAB — HEMOGLOBIN A1C
Hgb A1c MFr Bld: 13.5 % — ABNORMAL HIGH (ref 4.8–5.6)
Mean Plasma Glucose: 340.75 mg/dL

## 2021-06-01 LAB — CBC
HCT: 34.8 % — ABNORMAL LOW (ref 36.0–46.0)
Hemoglobin: 11.3 g/dL — ABNORMAL LOW (ref 12.0–15.0)
MCH: 26.5 pg (ref 26.0–34.0)
MCHC: 32.5 g/dL (ref 30.0–36.0)
MCV: 81.5 fL (ref 80.0–100.0)
Platelets: 282 10*3/uL (ref 150–400)
RBC: 4.27 MIL/uL (ref 3.87–5.11)
RDW: 14.2 % (ref 11.5–15.5)
WBC: 11.9 10*3/uL — ABNORMAL HIGH (ref 4.0–10.5)
nRBC: 0 % (ref 0.0–0.2)

## 2021-06-01 LAB — GLUCOSE, CAPILLARY
Glucose-Capillary: 274 mg/dL — ABNORMAL HIGH (ref 70–99)
Glucose-Capillary: 236 mg/dL — ABNORMAL HIGH (ref 70–99)
Glucose-Capillary: 247 mg/dL — ABNORMAL HIGH (ref 70–99)
Glucose-Capillary: 95 mg/dL (ref 70–99)
Glucose-Capillary: 158 mg/dL — ABNORMAL HIGH (ref 70–99)

## 2021-06-01 LAB — HIV ANTIBODY (ROUTINE TESTING W REFLEX): HIV Screen 4th Generation wRfx: NONREACTIVE

## 2021-06-01 MED ORDER — AMLODIPINE BESYLATE 5 MG PO TABS
5.0000 mg | ORAL_TABLET | Freq: Every day | ORAL | Status: DC
  Administered 2021-06-02 – 2021-06-03 (×2): 5 mg via ORAL
  Filled 2021-06-01 (×2): qty 1

## 2021-06-01 MED ORDER — INSULIN ASPART 100 UNIT/ML IJ SOLN
0.0000 [IU] | Freq: Three times a day (TID) | INTRAMUSCULAR | Status: DC
  Administered 2021-06-01 – 2021-06-02 (×3): 7 [IU] via SUBCUTANEOUS
  Administered 2021-06-02: 17:00:00 4 [IU] via SUBCUTANEOUS

## 2021-06-01 MED ORDER — INSULIN ASPART 100 UNIT/ML IJ SOLN: 15.0000 [IU] | Freq: Three times a day (TID) | INTRAMUSCULAR | Status: DC

## 2021-06-01 MED ORDER — INSULIN ASPART 100 UNIT/ML IJ SOLN: 0.0000 [IU] | Freq: Every day | INTRAMUSCULAR | Status: DC

## 2021-06-01 MED ORDER — GUAIFENESIN-DM 100-10 MG/5ML PO SYRP
5.0000 mL | ORAL_SOLUTION | ORAL | Status: DC | PRN
  Administered 2021-06-01: 22:00:00 5 mL via ORAL
  Filled 2021-06-01: qty 5

## 2021-06-01 MED ORDER — INSULIN ASPART 100 UNIT/ML IJ SOLN
15.0000 [IU] | Freq: Three times a day (TID) | INTRAMUSCULAR | Status: DC
  Administered 2021-06-01 (×2): 15 [IU] via SUBCUTANEOUS

## 2021-06-01 MED ORDER — ADULT MULTIVITAMIN W/MINERALS CH
1.0000 | ORAL_TABLET | Freq: Every day | ORAL | Status: DC
  Administered 2021-06-02 – 2021-06-03 (×2): 1 via ORAL
  Filled 2021-06-01 (×2): qty 1

## 2021-06-01 MED ORDER — INSULIN ASPART 100 UNIT/ML IJ SOLN: 20.0000 [IU] | Freq: Three times a day (TID) | INTRAMUSCULAR | Status: DC

## 2021-06-01 MED ORDER — INSULIN GLARGINE-YFGN 100 UNIT/ML ~~LOC~~ SOLN
75.0000 [IU] | Freq: Every day | SUBCUTANEOUS | Status: DC
Start: 2021-06-01 — End: 2021-06-02
  Administered 2021-06-01: 20:00:00 75 [IU] via SUBCUTANEOUS
  Filled 2021-06-01 (×2): qty 0.75

## 2021-06-01 MED ORDER — FUROSEMIDE 20 MG PO TABS
20.0000 mg | ORAL_TABLET | Freq: Every day | ORAL | Status: DC
  Administered 2021-06-01 – 2021-06-03 (×3): 20 mg via ORAL
  Filled 2021-06-01 (×3): qty 1

## 2021-06-01 NOTE — Progress Notes (Signed)
PROGRESS NOTE   [name]  YSA:[id number] DOB: [birth year] DOA: 05/31/2021 PCP: Rosita Fire, MD   Chief Complaint  Patient presents with   Chest Pain   Level of care: Telemetry  Brief Admission History:   60 y.o. female, medical history of asthma, COPD, diabetes mellitus, insulin-dependent, hypertension, morbid obesity, patient presents to ED secondary to multiple complaints, fever, chills, change in color of the urine, as well she does endorse some dyspnea as well, though she does report right flank pain, does report fever and chills for the last couple of days, she reports history of influenza 3 weeks ago, but reports she has not improved much since, denies any history of renal stone, hematuria, as well she does report constipation.  She reports nausea and vomiting.  In ED patient was noted to be febrile 100.5, she was noted to have right CVA tenderness, CT abdomen pelvis significant for right pyelonephritis, work-up significant for low sodium at 131, glucose of 353, and white count of 14.9, lactic acid within normal limit at 1.5, she had positive UA as well.  She is started on IV Rocephin and [facility] hospitalist consulted to admit.  Assessment & Plan:   Principal Problem:   Acute pyelonephritis Active Problems:   Essential hypertension, benign   Diabetes mellitus, type II ([place])   Asthma   Morbid obesity ([place])   Uncontrolled type 2 diabetes mellitus with hyperglycemia ([facility])   Mixed hyperlipidemia  Right pyelonephritis  - as noted on CT abdomen significant findings of right pyelonephritis - continues to have fever and flank/abdominal pain - follow blood cultures and urine culture for organism identification - continue high dose ceftriaxone 2 gm every 24 hours - working on improving glycemic control   Leukocytosis  - WBC is trending down with treatment which is reassuring   Uncontrolled type 2 diabetes mellitus with glucose toxicity - as evidenced by an A1c >13%  - continue  lantus 90 units plus added novolog 20 units TID with meals plus resistant SSI coverage - continue to monitor CBG 5 times per day - carb modified diet   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 - Beta-2 transferrin positive - left posterior table dehiscence noted on maxillofacial CT  - She is followed by Dr. Wendie Chess with ENT and Dr. Myrtie Neither with neurosurgery.  - monitor for signs of meningitis, fever, worsening headache, lethargy, sensitivity to light or neck stiffness - resume the lasix that they have her on  - She had an LP in September with normal opening pressure 18 cm H2O  - Neurosurgery is evaluating her for possible IIH.  Idiopathic Intracranial Hypertension - at this time patient has no symptoms suggesting meningitis   Atypical chest pain - symptoms have resolved with supportive measures  Hyperlipidemia  - working on improving glycemic control which is the most potent way to improve lipids - resumed home statin   Asthma/COPD by history - stable on home PRN bronchodilators   DVT prophylaxis: enoxaparin  Code Status: full  Family Communication: discussed with patient at bedside  Disposition: home  Status is: Inpatient  Remains inpatient appropriate because: Requires IV antibiotics for significant infection    Consultants:    Procedures:    Antimicrobials:  Ceftriaxone 12/23>>   Subjective: Pt continues to have right flank pain and fever   Objective: Vitals:   06/01/21 0505 06/01/21 0839 06/01/21 0934 06/01/21 1308  BP: (!) 119/44  (!) 107/57 (!) 115/42  Pulse: 99  99 99  Resp: 18  18 18   Temp: 98 F (36.7 C)  99.3 F (37.4 C) 98.9 F (37.2 C)  TempSrc:   Oral   SpO2: 93% 94% 97% 98%  Weight:      Height:        Intake/Output Summary (Last 24 hours) at 06/01/2021 1516 Last data filed at 06/01/2021 0900 Gross per 24 hour  Intake 1948.32 ml   Output --  Net 1948.32 ml   Filed Weights   05/31/21 2356  Weight: 97.5 kg   Examination:  General exam: awake, alert, cooperative, pleasant, well spoken, Appears calm and comfortable  Neck: supple exam no JVD, no thyromegaly. No thyroid nodules palpated.  Respiratory system: Clear to auscultation. Respiratory effort normal. Cardiovascular system: normal S1 & S2 heard. No JVD, murmurs, rubs, gallops or clicks. No pedal edema. Gastrointestinal system: Abdomen is nondistended, soft and nontender. No organomegaly or masses felt. Normal bowel sounds heard. Right CVA tenderness.  Central nervous system: no meningismus signs, Alert and oriented. No focal neurological deficits. Extremities: Symmetric 5 x 5 power. Skin: No rashes, lesions or ulcers Psychiatry: Judgement and insight appear normal. Mood & affect appropriate.   Data Reviewed: I have personally reviewed following labs and imaging studies  CBC: Recent Labs  Lab 05/31/21 1702 06/01/21 0421  WBC 14.9* 11.9*  NEUTROABS 12.6*  --   HGB 12.8 11.3*  HCT 39.0 34.8*  MCV 81.6 81.5  PLT 327 633    Basic Metabolic Panel: Recent Labs  Lab 05/31/21 1702 06/01/21 0421  NA 131* 134*  K 3.7 3.9  CL 91* 96*  CO2 27 29  GLUCOSE 353* 295*  BUN 14 14  CREATININE 0.90 0.76  CALCIUM 9.6 8.8*    GFR: Estimated Creatinine Clearance: 79.9 mL/min (by C-G formula based on SCr of 0.76 mg/dL).  Liver Function Tests: Recent Labs  Lab 05/31/21 1702  AST 44*  ALT 51*  ALKPHOS 131*  BILITOT 0.6  PROT 7.7  ALBUMIN 2.9*    CBG: Recent Labs  Lab 05/31/21 2253 05/31/21 2255 06/01/21 0508 06/01/21 0731 06/01/21 1122  GLUCAP 408* 439* 274* 236* 247*    Recent Results (from the past 240 hour(s))  Resp Panel by RT-PCR (Flu A&B, Covid) Nasopharyngeal Swab     Status: None   Collection Time: 05/31/21  5:07 PM   Specimen: Nasopharyngeal Swab; Nasopharyngeal(NP) swabs in vial transport medium  Result Value Ref Range Status    SARS Coronavirus 2 by RT PCR NEGATIVE NEGATIVE Final    Comment: (NOTE) SARS-CoV-2 target nucleic acids are NOT DETECTED.  The SARS-CoV-2 RNA is generally detectable in upper respiratory specimens during the acute phase of infection. The lowest concentration of SARS-CoV-2 viral copies this assay can detect is 138 copies/mL. A negative result does not preclude SARS-Cov-2 infection and should not be used as the sole basis for treatment or other patient management decisions. A negative result may occur with  improper specimen collection/handling, submission of specimen other than nasopharyngeal swab, presence of viral mutation(s) within the areas targeted by this assay, and inadequate number of viral copies(<138 copies/mL). A negative result must be combined with clinical observations, patient history, and epidemiological information. The expected result is Negative.  Fact Sheet for Patients:  EntrepreneurPulse.com.au  Fact Sheet for Healthcare Providers:  IncredibleEmployment.be  This test is no t yet approved or cleared by the [place] and  has been authorized for  detection and/or diagnosis of SARS-CoV-2 by FDA under an Emergency Use Authorization (EUA). This EUA will remain  in effect (meaning this test can be used) for the duration of the COVID-19 declaration under Section 564(b)(1) of the Act, 21 U.S.C.section 360bbb-3(b)(1), unless the authorization is terminated  or revoked sooner.       Influenza A by PCR NEGATIVE NEGATIVE Final   Influenza B by PCR NEGATIVE NEGATIVE Final    Comment: (NOTE) The Xpert Xpress SARS-CoV-2/FLU/RSV plus assay is intended as an aid in the diagnosis of influenza from Nasopharyngeal swab specimens and should not be used as a sole basis for treatment. Nasal washings and aspirates are unacceptable for Xpert Xpress SARS-CoV-2/FLU/RSV testing.  Fact Sheet for  Patients: EntrepreneurPulse.com.au  Fact Sheet for Healthcare Providers: IncredibleEmployment.be  This test is not yet approved or cleared by the [place] FDA and has been authorized for detection and/or diagnosis of SARS-CoV-2 by FDA under an Emergency Use Authorization (EUA). This EUA will remain in effect (meaning this test can be used) for the duration of the COVID-19 declaration under Section 564(b)(1) of the Act, 21 U.S.C. section 360bbb-3(b)(1), unless the authorization is terminated or revoked.  Performed at [facility], [address], [place], [place] [address]   Culture, blood (routine x 2)     Status: None (Preliminary result)   Collection Time: 05/31/21  8:19 PM   Specimen: Left Antecubital; Blood  Result Value Ref Range Status   Specimen Description LEFT ANTECUBITAL  Final   Special Requests   Final    BOTTLES DRAWN AEROBIC AND ANAEROBIC Blood Culture adequate volume   Culture   Final    NO GROWTH < 12 HOURS Performed at [facility], [address], [place], [place] [address]    Report Status PENDING  Incomplete  Culture, blood (routine x 2)     Status: None (Preliminary result)   Collection Time: 05/31/21 10:16 PM   Specimen: Left Antecubital; Blood  Result Value Ref Range Status   Specimen Description LEFT ANTECUBITAL  Final   Special Requests   Final    BOTTLES DRAWN AEROBIC AND ANAEROBIC Blood Culture adequate volume   Culture   Final    NO GROWTH < 12 HOURS Performed at [facility], [address] [place], [place] [address]    Report Status PENDING  Incomplete     Radiology Studies: DG Chest 2 View  Result Date: 05/31/2021 CLINICAL DATA:  Cough, congestion, shortness of breath, positive for flu at the end of November, history asthma EXAM: CHEST - 2 VIEW COMPARISON:  05/05/2021 FINDINGS: Normal heart size, mediastinal contours, and pulmonary vascularity. Atherosclerotic calcification aorta. Lungs clear. No  infiltrate, pleural effusion, or pneumothorax. Minimal endplate spur formation thoracic spine. IMPRESSION: No acute abnormalities. Aortic Atherosclerosis (ICD10-I70.0). Electronically Signed   By: Lavonia Dana M.D.   On: 05/31/2021 18:44   CT Abdomen Pelvis W Contrast  Result Date: 05/31/2021 CLINICAL DATA:  RIGHT-sided abdominal pain.  Nausea and constipation EXAM: CT ABDOMEN AND PELVIS WITH CONTRAST TECHNIQUE: Multidetector CT imaging of the abdomen and pelvis was performed using the standard protocol following bolus administration of intravenous contrast. CONTRAST:  141mL OMNIPAQUE IOHEXOL 300 MG/ML  SOLN COMPARISON:  None. FINDINGS: Lower chest: Lung bases are clear. Hepatobiliary: No focal hepatic lesion. Postcholecystectomy. No biliary dilatation. Pancreas: Pancreas is normal. No ductal dilatation. No pancreatic inflammation. Spleen: Normal spleen Adrenals/urinary tract: Thickened adrenal glands is favored adrenal hyperplasia. There is heterogeneous enhancement of the lower pole of the RIGHT  kidney (image 43/series 2). This involves approximately 4 cm by 2 cm focus of cortex in lower pole. There is perinephric stranding surrounding the lower pole . There is a small subcapsular fluid collection at the lower RIGHT pole (image 70/series 5). Findings are most suggestive of pyelonephritis lower pole RIGHT kidney. There is additional subtle hypoperfusion in the cortex of the upper pole as seen on coronal image 74/series 5. Ureters normal.  Bladder normal. Stomach/Bowel: Stomach, small bowel, appendix, and cecum are normal. The colon and rectosigmoid colon are normal. Vascular/Lymphatic: Abdominal aorta is normal caliber with atherosclerotic calcification. There is no retroperitoneal or periportal lymphadenopathy. No pelvic lymphadenopathy. Reproductive: Lobular in enlargement of the uterine fundus suggest underlying leiomyomas. Other: Midline ventral hernia just superior to the umbilicus contains peritoneal fat  Musculoskeletal: No aggressive osseous lesion. IMPRESSION: Findings most consistent with focal pyelonephritis in the lower pole of the RIGHT kidney. More subtle generalized pyelonephritis of the upper pole the RIGHT kidney. Electronically Signed   By: Suzy Bouchard M.D.   On: 05/31/2021 18:36    Scheduled Meds:  [START ON 06/02/2021] amLODipine  5 mg Oral Daily   aspirin EC  81 mg Oral Daily   enoxaparin (LOVENOX) injection  40 mg Subcutaneous Q24H   fluticasone  2 spray Each Nare Daily   furosemide  20 mg Oral Daily   gabapentin  100 mg Oral BID   insulin aspart  0-20 Units Subcutaneous TID WC   insulin aspart  0-5 Units Subcutaneous QHS   insulin aspart  20 Units Subcutaneous TID WC   insulin glargine-yfgn  90 Units Subcutaneous QHS   mometasone-formoterol  2 puff Inhalation BID   [START ON 06/02/2021] multivitamin with minerals  1 tablet Oral Daily   rosuvastatin  20 mg Oral Daily   thiamine  100 mg Oral Daily   Continuous Infusions:  sodium chloride 50 mL/hr at 06/01/21 1124   cefTRIAXone (ROCEPHIN)  IV      LOS: 1 day   Time spent: 40 minutes   Aislinn Feliz Wynetta Emery, MD How to contact the [facility] Attending or Consulting provider [place] or covering provider during after hours [place], for this patient?  Check the care team in [facility] and look for a) attending/consulting [facility] provider listed and b) the [facility] team listed Log into [email or web address] and use [place]'s universal password to access. If you do not have the password, please contact the hospital operator. Locate the [facility] provider you are looking for under [facility] and page to a number that you can be directly reached. If you still have difficulty reaching the provider, please page the [facility] (Director on Call) for the Hospitalists listed on amion for assistance.  06/01/2021, 3:16 PM

## 2021-06-02 DIAGNOSIS — Z794 Long term (current) use of insulin: Secondary | ICD-10-CM

## 2021-06-02 DIAGNOSIS — I10 Essential (primary) hypertension: Secondary | ICD-10-CM | POA: Diagnosis not present

## 2021-06-02 DIAGNOSIS — N10 Acute tubulo-interstitial nephritis: Secondary | ICD-10-CM | POA: Diagnosis not present

## 2021-06-02 DIAGNOSIS — E782 Mixed hyperlipidemia: Secondary | ICD-10-CM | POA: Diagnosis not present

## 2021-06-02 DIAGNOSIS — E1165 Type 2 diabetes mellitus with hyperglycemia: Secondary | ICD-10-CM | POA: Diagnosis not present

## 2021-06-02 LAB — GLUCOSE, CAPILLARY
Glucose-Capillary: 103 mg/dL — ABNORMAL HIGH (ref 70–99)
Glucose-Capillary: 73 mg/dL (ref 70–99)
Glucose-Capillary: 244 mg/dL — ABNORMAL HIGH (ref 70–99)
Glucose-Capillary: 194 mg/dL — ABNORMAL HIGH (ref 70–99)
Glucose-Capillary: 127 mg/dL — ABNORMAL HIGH (ref 70–99)

## 2021-06-02 LAB — CBC WITH DIFFERENTIAL/PLATELET
Abs Immature Granulocytes: 0.06 10*3/uL (ref 0.00–0.07)
Basophils Absolute: 0 10*3/uL (ref 0.0–0.1)
Basophils Relative: 0 %
Eosinophils Absolute: 0.2 10*3/uL (ref 0.0–0.5)
Eosinophils Relative: 1 %
HCT: 33.7 % — ABNORMAL LOW (ref 36.0–46.0)
Hemoglobin: 11 g/dL — ABNORMAL LOW (ref 12.0–15.0)
Immature Granulocytes: 1 %
Lymphocytes Relative: 11 %
Lymphs Abs: 1.1 10*3/uL (ref 0.7–4.0)
MCH: 27 pg (ref 26.0–34.0)
MCHC: 32.6 g/dL (ref 30.0–36.0)
MCV: 82.8 fL (ref 80.0–100.0)
Monocytes Absolute: 0.9 10*3/uL (ref 0.1–1.0)
Monocytes Relative: 9 %
Neutro Abs: 8.3 10*3/uL — ABNORMAL HIGH (ref 1.7–7.7)
Neutrophils Relative %: 78 %
Platelets: 294 10*3/uL (ref 150–400)
RBC: 4.07 MIL/uL (ref 3.87–5.11)
RDW: 14.5 % (ref 11.5–15.5)
WBC: 10.6 10*3/uL — ABNORMAL HIGH (ref 4.0–10.5)
nRBC: 0 % (ref 0.0–0.2)

## 2021-06-02 LAB — COMPREHENSIVE METABOLIC PANEL
ALT: 179 U/L — ABNORMAL HIGH (ref 0–44)
AST: 325 U/L — ABNORMAL HIGH (ref 15–41)
Albumin: 2.3 g/dL — ABNORMAL LOW (ref 3.5–5.0)
Alkaline Phosphatase: 331 U/L — ABNORMAL HIGH (ref 38–126)
Anion gap: 12 (ref 5–15)
BUN: 12 mg/dL (ref 6–20)
CO2: 25 mmol/L (ref 22–32)
Calcium: 8.8 mg/dL — ABNORMAL LOW (ref 8.9–10.3)
Chloride: 99 mmol/L (ref 98–111)
Creatinine, Ser: 0.72 mg/dL (ref 0.44–1.00)
GFR, Estimated: >60 mL/min (ref >60)
Glucose, Bld: 101 mg/dL — ABNORMAL HIGH (ref 70–99)
Potassium: 3.6 mmol/L (ref 3.5–5.1)
Sodium: 136 mmol/L (ref 135–145)
Total Bilirubin: 0.2 mg/dL — ABNORMAL LOW (ref 0.3–1.2)
Total Protein: 6.3 g/dL — ABNORMAL LOW (ref 6.5–8.1)

## 2021-06-02 MED ORDER — ROSUVASTATIN CALCIUM 10 MG PO TABS: 10.0000 mg | ORAL_TABLET | Freq: Every evening | ORAL | Status: DC

## 2021-06-02 MED ORDER — INSULIN GLARGINE-YFGN 100 UNIT/ML ~~LOC~~ SOLN
60.0000 [IU] | Freq: Every day | SUBCUTANEOUS | Status: DC
Start: 2021-06-02 — End: 2021-06-03
  Administered 2021-06-02: 22:00:00 60 [IU] via SUBCUTANEOUS
  Filled 2021-06-02 (×2): qty 0.6

## 2021-06-02 MED ORDER — INSULIN ASPART 100 UNIT/ML IJ SOLN
12.0000 [IU] | Freq: Three times a day (TID) | INTRAMUSCULAR | Status: DC
  Administered 2021-06-02 (×2): 12 [IU] via SUBCUTANEOUS

## 2021-06-02 NOTE — Progress Notes (Signed)
PROGRESS NOTE   [name]  VWU:[id number] DOB: 1960/08/03 DOA: 05/31/2021 PCP: Rosita Fire, MD   Chief Complaint  Patient presents with   Chest Pain   Level of care: Telemetry  Brief Admission History:   60 y.o. female, medical history of asthma, COPD, diabetes mellitus, insulin-dependent, hypertension, morbid obesity, patient presents to ED secondary to multiple complaints, fever, chills, change in color of the urine, as well she does endorse some dyspnea as well, though she does report right flank pain, does report fever and chills for the last couple of days, she reports history of influenza 3 weeks ago, but reports she has not improved much since, denies any history of renal stone, hematuria, as well she does report constipation.  She reports nausea and vomiting.  In ED patient was noted to be febrile 100.5, she was noted to have right CVA tenderness, CT abdomen pelvis significant for right pyelonephritis, work-up significant for low sodium at 131, glucose of 353, and white count of 14.9, lactic acid within normal limit at 1.5, she had positive UA as well.  She is started on IV Rocephin and [facility] hospitalist consulted to admit.  Assessment & Plan:   Principal Problem:   Acute pyelonephritis Active Problems:   Essential hypertension, benign   Diabetes mellitus, type II ([place])   Asthma   Morbid obesity ([place])   Uncontrolled type 2 diabetes mellitus with hyperglycemia ([place])   Mixed hyperlipidemia  Right pyelonephritis / Klebsiella pneumoniae UTI  - as noted on CT abdomen significant findings of right pyelonephritis - continues to have fever and flank/abdominal pain - follow blood cultures and urine culture for organism identification - continue high dose ceftriaxone 2 gm every 24 hours - working on improving glycemic control   Leukocytosis  - WBC is trending down with treatment which is reassuring   Uncontrolled type 2 diabetes mellitus with glucose toxicity - as evidenced  by an A1c >13%  - continue lantus 60 units plus added novolog 12 units TID with meals plus resistant SSI coverage - continue to monitor CBG 5 times per day - carb modified diet   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 - Beta-2 transferrin positive - left posterior table dehiscence noted on maxillofacial CT  - She is followed by Dr. Wendie Chess with ENT and Dr. Myrtie Neither with neurosurgery.  - monitor for signs of meningitis, fever, worsening headache, lethargy, sensitivity to light or neck stiffness - resume the lasix that they have her on  - She had an LP in September with normal opening pressure 18 cm H2O  - Neurosurgery is evaluating her for possible IIH.  Idiopathic Intracranial Hypertension - at this time patient has no symptoms suggesting meningitis   Atypical chest pain - symptoms have resolved with supportive measures  Hyperlipidemia  - working on improving glycemic control which is the most potent way to improve lipids - resumed home statin   Asthma/COPD by history - stable on home PRN bronchodilators   DVT prophylaxis: enoxaparin  Code Status: full  Family Communication: discussed with patient at bedside  Disposition: home tomorrow if C&S data available for transition to oral antibiotics Status is: Inpatient  Remains inpatient appropriate because: Requires IV antibiotics for significant infection    Consultants:    Procedures:    Antimicrobials:  Ceftriaxone 12/23>>   Subjective: Pt reports that the flank pain is getting much better  with treatments.  No neck pain or headaches at this time.   Objective: Vitals:   06/01/21 2205 06/02/21 0224 06/02/21 0531 06/02/21 0834  BP:   (!) 142/75   Pulse:   96   Resp:   20   Temp: 99.9 F (37.7 C) 98.7 F (37.1 C) 99.6 F (37.6 C)   TempSrc: Axillary Oral Oral   SpO2:   99% 94%  Weight:      Height:         Intake/Output Summary (Last 24 hours) at 06/02/2021 1136 Last data filed at 06/02/2021 0900 Gross per 24 hour  Intake 1036.89 ml  Output --  Net 1036.89 ml   Filed Weights   05/31/21 2356  Weight: 97.5 kg   Examination:  General exam: awake, alert, cooperative, pleasant, well spoken, Appears calm and comfortable  Neck: supple exam no JVD, no thyromegaly. No thyroid nodules palpated.  Respiratory system: Clear to auscultation. Respiratory effort normal. Cardiovascular system: normal S1 & S2 heard. No JVD, murmurs, rubs, gallops or clicks. No pedal edema. Gastrointestinal system: Abdomen is nondistended, soft and nontender. No organomegaly or masses felt. Normal bowel sounds heard. Right CVA tenderness.  Central nervous system: no meningismus signs, Alert and oriented. No focal neurological deficits. Extremities: Symmetric 5 x 5 power. Skin: No rashes, lesions or ulcers Psychiatry: Judgement and insight appear normal. Mood & affect appropriate.   Data Reviewed: I have personally reviewed following labs and imaging studies  CBC: Recent Labs  Lab 05/31/21 1702 06/01/21 0421 06/02/21 0453  WBC 14.9* 11.9* 10.6*  NEUTROABS 12.6*  --  8.3*  HGB 12.8 11.3* 11.0*  HCT 39.0 34.8* 33.7*  MCV 81.6 81.5 82.8  PLT 327 282 628    Basic Metabolic Panel: Recent Labs  Lab 05/31/21 1702 06/01/21 0421 06/02/21 0453  NA 131* 134* 136  K 3.7 3.9 3.6  CL 91* 96* 99  CO2 27 29 25   GLUCOSE 353* 295* 101*  BUN 14 14 12   CREATININE 0.90 0.76 0.72  CALCIUM 9.6 8.8* 8.8*    GFR: Estimated Creatinine Clearance: 79.9 mL/min (by C-G formula based on SCr of 0.72 mg/dL).  Liver Function Tests: Recent Labs  Lab 05/31/21 1702 06/02/21 0453  AST 44* 325*  ALT 51* 179*  ALKPHOS 131* 331*  BILITOT 0.6 0.2*  PROT 7.7 6.3*  ALBUMIN 2.9* 2.3*    CBG: Recent Labs  Lab 06/01/21 1617 06/01/21 1949 06/02/21 0222 06/02/21 0714 06/02/21 1110  GLUCAP 95 158* 103* 73 244*     Recent Results (from the past 240 hour(s))  Resp Panel by RT-PCR (Flu A&B, Covid) Nasopharyngeal Swab     Status: None   Collection Time: 05/31/21  5:07 PM   Specimen: Nasopharyngeal Swab; Nasopharyngeal(NP) swabs in vial transport medium  Result Value Ref Range Status   SARS Coronavirus 2 by RT PCR NEGATIVE NEGATIVE Final    Comment: (NOTE) SARS-CoV-2 target nucleic acids are NOT DETECTED.  The SARS-CoV-2 RNA is generally detectable in upper respiratory specimens during the acute phase of infection. The lowest concentration of SARS-CoV-2 viral copies this assay can detect is 138 copies/mL. A negative result does not preclude SARS-Cov-2 infection and should not be used as the sole basis for treatment or other patient management decisions. A negative result may occur with  improper specimen collection/handling, submission of specimen other than nasopharyngeal swab, presence of viral mutation(s) within the areas targeted by this assay, and inadequate number of viral copies(<138 copies/mL). A negative result must be  combined with clinical observations, patient history, and epidemiological information. The expected result is Negative.  Fact Sheet for Patients:  EntrepreneurPulse.com.au  Fact Sheet for Healthcare Providers:  IncredibleEmployment.be  This test is no t yet approved or cleared by the [place] FDA and  has been authorized for detection and/or diagnosis of SARS-CoV-2 by FDA under an Emergency Use Authorization (EUA). This EUA will remain  in effect (meaning this test can be used) for the duration of the COVID-19 declaration under Section 564(b)(1) of the Act, 21 U.S.C.section 360bbb-3(b)(1), unless the authorization is terminated  or revoked sooner.       Influenza A by PCR NEGATIVE NEGATIVE Final   Influenza B by PCR NEGATIVE NEGATIVE Final    Comment: (NOTE) The Xpert Xpress SARS-CoV-2/FLU/RSV plus assay is intended as an  aid in the diagnosis of influenza from Nasopharyngeal swab specimens and should not be used as a sole basis for treatment. Nasal washings and aspirates are unacceptable for Xpert Xpress SARS-CoV-2/FLU/RSV testing.  Fact Sheet for Patients: EntrepreneurPulse.com.au  Fact Sheet for Healthcare Providers: IncredibleEmployment.be  This test is not yet approved or cleared by the [place] FDA and has been authorized for detection and/or diagnosis of SARS-CoV-2 by FDA under an Emergency Use Authorization (EUA). This EUA will remain in effect (meaning this test can be used) for the duration of the COVID-19 declaration under Section 564(b)(1) of the Act, 21 U.S.C. section 360bbb-3(b)(1), unless the authorization is terminated or revoked.  Performed at [facility], [address], [place], [place] [address]   Urine Culture     Status: Abnormal (Preliminary result)   Collection Time: 05/31/21  6:48 PM   Specimen: Urine, Clean Catch  Result Value Ref Range Status   Specimen Description   Final    URINE, CLEAN CATCH Performed at [facility], [address] [place], [place] [address]    Special Requests   Final    NONE Performed at [facility], [address], [place], [place] [address]    Culture (A)  Final    >=100,000 COLONIES/mL KLEBSIELLA PNEUMONIAE SUSCEPTIBILITIES TO FOLLOW Performed at [facility], [place] [address], [place], [place] [address]    Report Status PENDING  Incomplete  Culture, blood (routine x 2)     Status: None (Preliminary result)   Collection Time: 05/31/21  8:19 PM   Specimen: Left Antecubital; Blood  Result Value Ref Range Status   Specimen Description LEFT ANTECUBITAL  Final   Special Requests   Final    BOTTLES DRAWN AEROBIC AND ANAEROBIC Blood Culture adequate volume   Culture   Final    NO GROWTH < 12 HOURS Performed at [facility], [address] [place], [place] [address]    Report Status PENDING   Incomplete  Culture, blood (routine x 2)     Status: None (Preliminary result)   Collection Time: 05/31/21 10:16 PM   Specimen: Left Antecubital; Blood  Result Value Ref Range Status   Specimen Description LEFT ANTECUBITAL  Final   Special Requests   Final    BOTTLES DRAWN AEROBIC AND ANAEROBIC Blood Culture adequate volume   Culture   Final    NO GROWTH < 12 HOURS Performed at [facility], [address] [place], [place] [address]    Report Status PENDING  Incomplete     Radiology Studies: DG Chest 2 View  Result Date: 05/31/2021 CLINICAL DATA:  Cough, congestion, shortness of breath, positive for flu at the end of November, history  asthma EXAM: CHEST - 2 VIEW COMPARISON:  05/05/2021 FINDINGS: Normal heart size, mediastinal contours, and pulmonary vascularity. Atherosclerotic calcification aorta. Lungs clear. No infiltrate, pleural effusion, or pneumothorax. Minimal endplate spur formation thoracic spine. IMPRESSION: No acute abnormalities. Aortic Atherosclerosis (ICD10-I70.0). Electronically Signed   By: Lavonia Dana M.D.   On: 05/31/2021 18:44   CT Abdomen Pelvis W Contrast  Result Date: 05/31/2021 CLINICAL DATA:  RIGHT-sided abdominal pain.  Nausea and constipation EXAM: CT ABDOMEN AND PELVIS WITH CONTRAST TECHNIQUE: Multidetector CT imaging of the abdomen and pelvis was performed using the standard protocol following bolus administration of intravenous contrast. CONTRAST:  151mL OMNIPAQUE IOHEXOL 300 MG/ML  SOLN COMPARISON:  None. FINDINGS: Lower chest: Lung bases are clear. Hepatobiliary: No focal hepatic lesion. Postcholecystectomy. No biliary dilatation. Pancreas: Pancreas is normal. No ductal dilatation. No pancreatic inflammation. Spleen: Normal spleen Adrenals/urinary tract: Thickened adrenal glands is favored adrenal hyperplasia. There is heterogeneous enhancement of the lower pole of the RIGHT kidney (image 43/series 2). This involves approximately 4 cm by 2 cm focus of cortex in  lower pole. There is perinephric stranding surrounding the lower pole . There is a small subcapsular fluid collection at the lower RIGHT pole (image 70/series 5). Findings are most suggestive of pyelonephritis lower pole RIGHT kidney. There is additional subtle hypoperfusion in the cortex of the upper pole as seen on coronal image 74/series 5. Ureters normal.  Bladder normal. Stomach/Bowel: Stomach, small bowel, appendix, and cecum are normal. The colon and rectosigmoid colon are normal. Vascular/Lymphatic: Abdominal aorta is normal caliber with atherosclerotic calcification. There is no retroperitoneal or periportal lymphadenopathy. No pelvic lymphadenopathy. Reproductive: Lobular in enlargement of the uterine fundus suggest underlying leiomyomas. Other: Midline ventral hernia just superior to the umbilicus contains peritoneal fat Musculoskeletal: No aggressive osseous lesion. IMPRESSION: Findings most consistent with focal pyelonephritis in the lower pole of the RIGHT kidney. More subtle generalized pyelonephritis of the upper pole the RIGHT kidney. Electronically Signed   By: Suzy Bouchard M.D.   On: 05/31/2021 18:36    Scheduled Meds:  amLODipine  5 mg Oral Daily   aspirin EC  81 mg Oral Daily   enoxaparin (LOVENOX) injection  40 mg Subcutaneous Q24H   fluticasone  2 spray Each Nare Daily   furosemide  20 mg Oral Daily   gabapentin  100 mg Oral BID   insulin aspart  0-20 Units Subcutaneous TID WC   insulin aspart  0-5 Units Subcutaneous QHS   insulin aspart  12 Units Subcutaneous TID WC   insulin glargine-yfgn  60 Units Subcutaneous QHS   mometasone-formoterol  2 puff Inhalation BID   multivitamin with minerals  1 tablet Oral Daily   [START ON 06/05/2021] rosuvastatin  10 mg Oral QPM   thiamine  100 mg Oral Daily   Continuous Infusions:  cefTRIAXone (ROCEPHIN)  IV 2 g (06/01/21 2002)    LOS: 2 days   Time spent: 35 minutes   Carle Fenech Wynetta Emery, MD How to contact the [facility] Attending or  Consulting provider [place] or covering provider during after hours [place], for this patient?  Check the care team in [facility] and look for a) attending/consulting [facility] provider listed and b) the [facility] team listed Log into [email or web address] and use [place]'s universal password to access. If you do not have the password, please contact the hospital operator. Locate the [facility] provider you are looking for under [facility] and page to a number that you can be directly reached. If you still  have difficulty reaching the provider, please page the [facility] (Director on Call) for the Hospitalists listed on amion for assistance.  06/02/2021, 11:36 AM

## 2021-06-02 NOTE — Progress Notes (Signed)
°  Transition of Care (TOC) Screening Note   Patient Details  Name: [name] Date of Birth: Aug 31, 1960   Transition of Care [facility]) CM/SW Contact:    Shade Flood, LCSW Phone Number: 06/02/2021, 1:52 PM    Transition of Care Department [facility]) has reviewed patient and no TOC needs have been identified at this time. Will continue to monitor patient advancement through interdisciplinary progression rounds. If new patient transition needs arise, please place a TOC consult.

## 2021-06-03 DIAGNOSIS — N10 Acute tubulo-interstitial nephritis: Secondary | ICD-10-CM | POA: Diagnosis not present

## 2021-06-03 DIAGNOSIS — E782 Mixed hyperlipidemia: Secondary | ICD-10-CM | POA: Diagnosis not present

## 2021-06-03 DIAGNOSIS — E1165 Type 2 diabetes mellitus with hyperglycemia: Secondary | ICD-10-CM | POA: Diagnosis not present

## 2021-06-03 DIAGNOSIS — I10 Essential (primary) hypertension: Secondary | ICD-10-CM | POA: Diagnosis not present

## 2021-06-03 LAB — URINE CULTURE: Culture: >=100000 — AB

## 2021-06-03 LAB — BASIC METABOLIC PANEL
Anion gap: 13 (ref 5–15)
BUN: 14 mg/dL (ref 6–20)
CO2: 23 mmol/L (ref 22–32)
Calcium: 9.1 mg/dL (ref 8.9–10.3)
Chloride: 99 mmol/L (ref 98–111)
Creatinine, Ser: 0.72 mg/dL (ref 0.44–1.00)
GFR, Estimated: >60 mL/min (ref >60)
Glucose, Bld: 127 mg/dL — ABNORMAL HIGH (ref 70–99)
Potassium: 3.5 mmol/L (ref 3.5–5.1)
Sodium: 135 mmol/L (ref 135–145)

## 2021-06-03 LAB — GLUCOSE, CAPILLARY
Glucose-Capillary: 141 mg/dL — ABNORMAL HIGH (ref 70–99)
Glucose-Capillary: 97 mg/dL (ref 70–99)
Glucose-Capillary: 227 mg/dL — ABNORMAL HIGH (ref 70–99)

## 2021-06-03 MED ORDER — ROSUVASTATIN CALCIUM 20 MG PO TABS: 10.0000 mg | ORAL_TABLET | Freq: Every day | ORAL | Status: AC

## 2021-06-03 MED ORDER — INSULIN GLARGINE-YFGN 100 UNIT/ML ~~LOC~~ SOLN
50.0000 [IU] | Freq: Every day | SUBCUTANEOUS | Status: DC
  Filled 2021-06-03: qty 0.5

## 2021-06-03 MED ORDER — METFORMIN HCL 500 MG PO TABS: 1000.0000 mg | ORAL_TABLET | Freq: Two times a day (BID) | ORAL | 0 refills | Status: DC

## 2021-06-03 MED ORDER — TOUJEO MAX SOLOSTAR 300 UNIT/ML ~~LOC~~ SOPN: 50.0000 [IU] | PEN_INJECTOR | Freq: Every day | SUBCUTANEOUS | 2 refills | Status: DC

## 2021-06-03 MED ORDER — CEFDINIR 300 MG PO CAPS: 300.0000 mg | ORAL_CAPSULE | Freq: Two times a day (BID) | ORAL | 0 refills | Status: AC

## 2021-06-03 NOTE — Discharge Instructions (Signed)
IMPORTANT INFORMATION: PAY CLOSE ATTENTION  ° °PHYSICIAN DISCHARGE INSTRUCTIONS ° °Follow with Primary care provider  Fanta, Tesfaye, MD  and other consultants as instructed by your Hospitalist Physician ° °SEEK MEDICAL CARE OR RETURN TO EMERGENCY ROOM IF SYMPTOMS COME BACK, WORSEN OR NEW PROBLEM DEVELOPS  ° °Please note: °You were cared for by a hospitalist during your hospital stay. Every effort will be made to forward records to your primary care provider.  You can request that your primary care provider send for your hospital records if they have not received them.  Once you are discharged, your primary care physician will handle any further medical issues. Please note that NO REFILLS for any discharge medications will be authorized once you are discharged, as it is imperative that you return to your primary care physician (or establish a relationship with a primary care physician if you do not have one) for your post hospital discharge needs so that they can reassess your need for medications and monitor your lab values. ° °Please get a complete blood count and chemistry panel checked by your Primary MD at your next visit, and again as instructed by your Primary MD. ° °Get Medicines reviewed and adjusted: °Please take all your medications with you for your next visit with your Primary MD ° °Laboratory/radiological data: °Please request your Primary MD to go over all hospital tests and procedure/radiological results at the follow up, please ask your primary care provider to get all Hospital records sent to his/her office. ° °In some cases, they will be blood work, cultures and biopsy results pending at the time of your discharge. Please request that your primary care provider follow up on these results. ° °If you are diabetic, please bring your blood sugar readings with you to your follow up appointment with primary care.   ° °Please call and make your follow up appointments as soon as possible.   ° °Also Note  the following: °If you experience worsening of your admission symptoms, develop shortness of breath, life threatening emergency, suicidal or homicidal thoughts you must seek medical attention immediately by calling 911 or calling your MD immediately  if symptoms less severe. ° °You must read complete instructions/literature along with all the possible adverse reactions/side effects for all the Medicines you take and that have been prescribed to you. Take any new Medicines after you have completely understood and accpet all the possible adverse reactions/side effects.  ° °Do not drive when taking Pain medications or sleeping medications (Benzodiazepines) ° °Do not take more than prescribed Pain, Sleep and Anxiety Medications. It is not advisable to combine anxiety,sleep and pain medications without talking with your primary care practitioner ° °Special Instructions: If you have smoked or chewed Tobacco  in the last 2 yrs please stop smoking, stop any regular Alcohol  and or any Recreational drug use. ° °Wear Seat belts while driving.  Do not drive if taking any narcotic, mind altering or controlled substances or recreational drugs or alcohol.  ° ° ° ° ° ° °

## 2021-06-03 NOTE — Discharge Summary (Signed)
Physician Discharge Summary  [name] GNF:[id number] DOB: 03-05-1961 DOA: 05/31/2021  PCP: Rosita Fire, MD Endocrinology: Dr. Dorris Fetch ENT: Dr. Wendie Chess Neurosurgery: Dr. Elliot Gault  Admit date: 05/31/2021 Discharge date: 06/03/2021  Admitted From:  HOME  Disposition: HOME   Recommendations for Outpatient Follow-up:  Follow up with PCP in 1 weeks Follow up with neurosurgeon and ENT in 1-2 weeks Follow up with endocrinologist in 2 weeks  Please obtain BMP/CBC in 1-2 weeks  Discharge Condition: STABLE   CODE STATUS: FULL DIET: Heart healthy/Carb Modified    Brief Hospitalization Summary: Please see all hospital notes, images, labs for full details of the hospitalization. Brief Admission History:   60 y.o. female, medical history of asthma, COPD, diabetes mellitus, insulin-dependent, hypertension, morbid obesity, patient presents to ED secondary to multiple complaints, fever, chills, change in color of the urine, as well she does endorse some dyspnea as well, though she does report right flank pain, does report fever and chills for the last couple of days, she reports history of influenza 3 weeks ago, but reports she has not improved much since, denies any history of renal stone, hematuria, as well she does report constipation.  She reports nausea and vomiting.  In ED patient was noted to be febrile 100.5, she was noted to have right CVA tenderness, CT abdomen pelvis significant for right pyelonephritis, work-up significant for low sodium at 131, glucose of 353, and white count of 14.9, lactic acid within normal limit at 1.5, she had positive UA as well.  She is started on IV Rocephin and [facility] hospitalist consulted to admit.   HOSPITAL COURSE BY PROBLEM   Right pyelonephritis / Klebsiella pneumoniae UTI  - as noted on CT abdomen significant findings of right pyelonephritis - fever and flank/abdominal pain RESOLVED - blood cultures NGTD and urine culture positive for klebsiella  pneumonia sensitive to ceftriaxone - Pt was treated with high dose ceftriaxone 2 gm every 24 hours - working on improving glycemic control, blood sugars now less than 150  - Pt will discharge home and complete full 14 days of antibiotics: cefdinir 300 mg BID x 11 days.     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 - Beta-2 transferrin positive - left posterior table dehiscence noted on maxillofacial CT  - She is followed by Dr. Wendie Chess with ENT and Dr. Myrtie Neither with neurosurgery.  - monitor for signs of meningitis, fever, worsening headache, lethargy, sensitivity to light or neck stiffness - resume the lasix that they have her on  - She had an LP in September with normal opening pressure 18 cm H2O  - Neurosurgery is evaluating her for possible IIH.  Idiopathic Intracranial Hypertension - at this time patient has no symptoms suggesting meningitis  - outpatient follow up with ENT and neurosurgery strongly recommended  to patient    Atypical chest pain - symptoms have resolved with supportive measures   Hyperlipidemia / diabetic dyslipidemia - working on improving glycemic control which is the most potent way to improve lipids - I reduced her crestor dose by 50% to 10 mg daily because her LFTs bumped after 1 dose of the 20 mg tablet   Asthma/COPD by history - stable  on home PRN bronchodilators    DVT prophylaxis: enoxaparin  Code Status: full  Family Communication: discussed with patient at bedside  Disposition: home with close outpatient follow up Status is: Inpatient   Remains inpatient appropriate because: Requires IV antibiotics for significant infection    Consultants:      Procedures:      Antimicrobials:  Ceftriaxone 12/23-12/25   Discharge Diagnoses:  Principal Problem:   Acute pyelonephritis Active Problems:   Essential hypertension, benign   Diabetes mellitus, type II ([place])   Asthma   Morbid obesity ([place])   Uncontrolled type 2 diabetes mellitus with hyperglycemia ([place])   Mixed hyperlipidemia   Discharge Instructions:  Allergies as of 06/03/2021   No Known Allergies      Medication List     TAKE these medications    amLODipine 5 MG tablet Commonly known as: NORVASC Take 1 tablet by mouth daily.   aspirin EC 81 MG tablet Take 81 mg by mouth daily.   B-D UF III MINI PEN NEEDLES 31G X 5 MM Misc Generic drug: Insulin Pen Needle USE ONCE DAILY   cefdinir 300 MG capsule Commonly known as: OMNICEF Take 1 capsule (300 mg total) by mouth 2 (two) times daily for 11 days.   diclofenac sodium 1 % Gel Commonly known as: VOLTAREN 4 (four) times daily as needed.   fluticasone 50 MCG/ACT nasal spray Commonly known as: FLONASE Place 2 sprays into both nostrils daily.   Fluticasone-Salmeterol 250-50 MCG/DOSE Aepb Commonly known as: ADVAIR Inhale 1 puff into the lungs 2 (two) times daily.   furosemide 20 MG tablet Commonly known as: LASIX Take 20 mg by mouth daily.   gabapentin 100 MG capsule Commonly known as: NEURONTIN Take 1 capsule by mouth 2 (two) times daily.   glipiZIDE 5 MG 24 hr tablet Commonly known as: GLUCOTROL XL TAKE 1 TABLET BY MOUTH EVERY DAY WITH BREAKFAST   ipratropium-albuterol 0.5-2.5 (3) MG/3ML Soln Commonly known as: DUONEB Take 3 mLs by nebulization every 4 (four) hours as needed  (Shortness of Breath).   COMBIVENT RESPIMAT IN Inhale 1 puff into the lungs daily as needed (shortness of breath).   losartan-hydrochlorothiazide 100-25 MG tablet Commonly known as: HYZAAR Take 1 tablet by mouth daily.   metFORMIN 500 MG tablet Commonly known as: GLUCOPHAGE Take 2 tablets (1,000 mg total) by mouth 2 (two) times daily with a meal. What changed: how much to take   multivitamin with minerals Tabs tablet Take 1 tablet by mouth daily.   rosuvastatin 20 MG tablet Commonly known as: CRESTOR Take 0.5 tablets (10 mg total) by mouth daily. What changed: how much to take   thiamine 100 MG tablet Take 1 tablet by mouth daily.   Toujeo Max SoloStar 300 UNIT/ML Solostar Pen Generic drug: insulin glargine (2 Unit Dial) Inject 50 Units into the skin at bedtime. What changed: how much to take   traMADol 50 MG tablet Commonly known as: ULTRAM Take 50 mg by mouth 2 (two) times daily.        Follow-up Information     Rosita Fire, MD.  Schedule an appointment as soon as possible for a visit.   Specialty: Internal Medicine Why: Hospital Follow Up Contact information: [place] [address] [phone]         Cassandria Anger, MD. Schedule an appointment as soon as possible for a visit in 2 week(s).   Specialty: Endocrinology Why: Hospital Follow Up Contact information: [place] [address] [phone]         Garen Grams, MD. Schedule an appointment as soon as possible for a visit in 2 week(s).   Specialty: Otolaryngology Why: Hospital Follow Up Contact information: [place] [address] [phone]         Elliot Gault, MD. Schedule an appointment as soon as possible for a visit in 2 week(s).   Specialty: Neurosurgery Why: Hospital Follow Up Contact information: [facility] [place] [address] [phone]                No Known  Allergies Allergies as of 06/03/2021   No Known Allergies      Medication List     TAKE these medications    amLODipine 5 MG tablet Commonly known as: NORVASC Take 1 tablet by mouth daily.   aspirin EC 81 MG tablet Take 81 mg by mouth daily.   B-D UF III MINI PEN NEEDLES 31G X 5 MM Misc Generic drug: Insulin Pen Needle USE ONCE DAILY   cefdinir 300 MG capsule Commonly known as: OMNICEF Take 1 capsule (300 mg total) by mouth 2 (two) times daily for 11 days.   diclofenac sodium 1 % Gel Commonly known as: VOLTAREN 4 (four) times daily as needed.   fluticasone 50 MCG/ACT nasal spray Commonly known as: FLONASE Place 2 sprays into both nostrils daily.   Fluticasone-Salmeterol 250-50 MCG/DOSE Aepb Commonly known as: ADVAIR Inhale 1 puff into the lungs 2 (two) times daily.   furosemide 20 MG tablet Commonly known as: LASIX Take 20 mg by mouth daily.   gabapentin 100 MG capsule Commonly known as: NEURONTIN Take 1 capsule by mouth 2 (two) times daily.   glipiZIDE 5 MG 24 hr tablet Commonly known as: GLUCOTROL XL TAKE 1 TABLET BY MOUTH EVERY DAY WITH BREAKFAST   ipratropium-albuterol 0.5-2.5 (3) MG/3ML Soln Commonly known as: DUONEB Take 3 mLs by nebulization every 4 (four) hours as needed (Shortness of Breath).   COMBIVENT RESPIMAT IN Inhale 1 puff into the lungs daily as needed (shortness of breath).   losartan-hydrochlorothiazide 100-25 MG tablet Commonly known as: HYZAAR Take 1 tablet by mouth daily.   metFORMIN 500 MG tablet Commonly known as: GLUCOPHAGE Take 2 tablets (1,000 mg total) by mouth 2 (two) times daily with a meal. What changed: how much to take   multivitamin with minerals Tabs tablet Take 1 tablet by mouth daily.   rosuvastatin 20 MG tablet Commonly known as: CRESTOR Take 0.5 tablets (10 mg total) by mouth daily. What changed: how much to take   thiamine 100 MG tablet Take 1 tablet by mouth daily.   Toujeo Max SoloStar 300 UNIT/ML  Solostar Pen Generic drug: insulin glargine (2 Unit Dial) Inject 50 Units into the skin at bedtime. What changed: how much to take   traMADol 50 MG tablet Commonly known as: ULTRAM Take 50 mg by mouth 2 (two) times daily.        Procedures/Studies: DG Chest 2 View  Result Date: 05/31/2021 CLINICAL DATA:  Cough, congestion, shortness of  breath, positive for flu at the end of November, history asthma EXAM: CHEST - 2 VIEW COMPARISON:  05/05/2021 FINDINGS: Normal heart size, mediastinal contours, and pulmonary vascularity. Atherosclerotic calcification aorta. Lungs clear. No infiltrate, pleural effusion, or pneumothorax. Minimal endplate spur formation thoracic spine. IMPRESSION: No acute abnormalities. Aortic Atherosclerosis (ICD10-I70.0). Electronically Signed   By: Lavonia Dana M.D.   On: 05/31/2021 18:44   CT Abdomen Pelvis W Contrast  Result Date: 05/31/2021 CLINICAL DATA:  RIGHT-sided abdominal pain.  Nausea and constipation EXAM: CT ABDOMEN AND PELVIS WITH CONTRAST TECHNIQUE: Multidetector CT imaging of the abdomen and pelvis was performed using the standard protocol following bolus administration of intravenous contrast. CONTRAST:  166mL OMNIPAQUE IOHEXOL 300 MG/ML  SOLN COMPARISON:  None. FINDINGS: Lower chest: Lung bases are clear. Hepatobiliary: No focal hepatic lesion. Postcholecystectomy. No biliary dilatation. Pancreas: Pancreas is normal. No ductal dilatation. No pancreatic inflammation. Spleen: Normal spleen Adrenals/urinary tract: Thickened adrenal glands is favored adrenal hyperplasia. There is heterogeneous enhancement of the lower pole of the RIGHT kidney (image 43/series 2). This involves approximately 4 cm by 2 cm focus of cortex in lower pole. There is perinephric stranding surrounding the lower pole . There is a small subcapsular fluid collection at the lower RIGHT pole (image 70/series 5). Findings are most suggestive of pyelonephritis lower pole RIGHT kidney. There is  additional subtle hypoperfusion in the cortex of the upper pole as seen on coronal image 74/series 5. Ureters normal.  Bladder normal. Stomach/Bowel: Stomach, small bowel, appendix, and cecum are normal. The colon and rectosigmoid colon are normal. Vascular/Lymphatic: Abdominal aorta is normal caliber with atherosclerotic calcification. There is no retroperitoneal or periportal lymphadenopathy. No pelvic lymphadenopathy. Reproductive: Lobular in enlargement of the uterine fundus suggest underlying leiomyomas. Other: Midline ventral hernia just superior to the umbilicus contains peritoneal fat Musculoskeletal: No aggressive osseous lesion. IMPRESSION: Findings most consistent with focal pyelonephritis in the lower pole of the RIGHT kidney. More subtle generalized pyelonephritis of the upper pole the RIGHT kidney. Electronically Signed   By: Suzy Bouchard M.D.   On: 05/31/2021 18:36   DG Chest Portable 1 View  Result Date: 05/05/2021 CLINICAL DATA:  Shortness of breath and chest pain for 2 days, cough, history COPD, asthma EXAM: PORTABLE CHEST 1 VIEW COMPARISON:  08/22/2020 FINDINGS: Normal heart size, mediastinal contours, and pulmonary vascularity. Atherosclerotic calcification aorta. Lungs clear. No pulmonary infiltrate, pleural effusion, or pneumothorax. Osseous structures unremarkable. IMPRESSION: No acute abnormalities. Aortic Atherosclerosis (ICD10-I70.0). Electronically Signed   By: Lavonia Dana M.D.   On: 05/05/2021 11:55     Subjective: Pt reports that her flank pain has resolved and she is feeling much better.  Pt is eating and drinking well.    Discharge Exam: Vitals:   06/03/21 0309 06/03/21 0736  BP: 140/72   Pulse: 90   Resp: 20   Temp: 98.5 F (36.9 C)   SpO2: 100% 100%   Vitals:   06/02/21 2022 06/02/21 2116 06/03/21 0309 06/03/21 0736  BP:  129/60 140/72   Pulse:  (!) 101 90   Resp:  20 20   Temp:  98.8 F (37.1 C) 98.5 F (36.9 C)   TempSrc:  Oral Oral   SpO2: 97% 96%  100% 100%  Weight:      Height:       General exam: awake, alert, cooperative, pleasant, well spoken, Appears calm and comfortable  Neck: supple exam no JVD, no thyromegaly. No thyroid nodules palpated.  Respiratory system: Clear to auscultation. Respiratory effort  normal. Cardiovascular system: normal S1 & S2 heard. No JVD, murmurs, rubs, gallops or clicks. No pedal edema. Gastrointestinal system: Abdomen is nondistended, soft and nontender. No organomegaly or masses felt. Normal bowel sounds heard. NO CVA tenderness.  Central nervous system: no meningismus signs, Alert and oriented. No focal neurological deficits. Extremities: Symmetric 5 x 5 power. Skin: No rashes, lesions or ulcers Psychiatry: Judgement and insight appear normal. Mood & affect appropriate.    The results of significant diagnostics from this hospitalization (including imaging, microbiology, ancillary and laboratory) are listed below for reference.     Microbiology: Recent Results (from the past 240 hour(s))  Resp Panel by RT-PCR (Flu A&B, Covid) Nasopharyngeal Swab     Status: None   Collection Time: 05/31/21  5:07 PM   Specimen: Nasopharyngeal Swab; Nasopharyngeal(NP) swabs in vial transport medium  Result Value Ref Range Status   SARS Coronavirus 2 by RT PCR NEGATIVE NEGATIVE Final    Comment: (NOTE) SARS-CoV-2 target nucleic acids are NOT DETECTED.  The SARS-CoV-2 RNA is generally detectable in upper respiratory specimens during the acute phase of infection. The lowest concentration of SARS-CoV-2 viral copies this assay can detect is 138 copies/mL. A negative result does not preclude SARS-Cov-2 infection and should not be used as the sole basis for treatment or other patient management decisions. A negative result may occur with  improper specimen collection/handling, submission of specimen other than nasopharyngeal swab, presence of viral mutation(s) within the areas targeted by this assay, and inadequate  number of viral copies(<138 copies/mL). A negative result must be combined with clinical observations, patient history, and epidemiological information. The expected result is Negative.  Fact Sheet for Patients:  EntrepreneurPulse.com.au  Fact Sheet for Healthcare Providers:  IncredibleEmployment.be  This test is no t yet approved or cleared by the [place] FDA and  has been authorized for detection and/or diagnosis of SARS-CoV-2 by FDA under an Emergency Use Authorization (EUA). This EUA will remain  in effect (meaning this test can be used) for the duration of the COVID-19 declaration under Section 564(b)(1) of the Act, 21 U.S.C.section 360bbb-3(b)(1), unless the authorization is terminated  or revoked sooner.       Influenza A by PCR NEGATIVE NEGATIVE Final   Influenza B by PCR NEGATIVE NEGATIVE Final    Comment: (NOTE) The Xpert Xpress SARS-CoV-2/FLU/RSV plus assay is intended as an aid in the diagnosis of influenza from Nasopharyngeal swab specimens and should not be used as a sole basis for treatment. Nasal washings and aspirates are unacceptable for Xpert Xpress SARS-CoV-2/FLU/RSV testing.  Fact Sheet for Patients: EntrepreneurPulse.com.au  Fact Sheet for Healthcare Providers: IncredibleEmployment.be  This test is not yet approved or cleared by the [place] FDA and has been authorized for detection and/or diagnosis of SARS-CoV-2 by FDA under an Emergency Use Authorization (EUA). This EUA will remain in effect (meaning this test can be used) for the duration of the COVID-19 declaration under Section 564(b)(1) of the Act, 21 U.S.C. section 360bbb-3(b)(1), unless the authorization is terminated or revoked.  Performed at [facility], [address] [place], [place] [address]   Urine Culture     Status: Abnormal   Collection Time: 05/31/21  6:48 PM   Specimen: Urine, Clean Catch  Result  Value Ref Range Status   Specimen Description   Final    URINE, CLEAN CATCH Performed at [facility], [address] [place], [place] [address]    Special Requests   Final    NONE Performed at  [facility], [address], [place], [place] [address]    Culture >=100,000 COLONIES/mL KLEBSIELLA PNEUMONIAE (A)  Final   Report Status 06/03/2021 FINAL  Final   Organism ID, Bacteria KLEBSIELLA PNEUMONIAE (A)  Final      Susceptibility   Klebsiella pneumoniae - MIC*    AMPICILLIN RESISTANT Resistant     CEFAZOLIN <=4 SENSITIVE Sensitive     CEFEPIME <=0.12 SENSITIVE Sensitive     CEFTRIAXONE <=0.25 SENSITIVE Sensitive     CIPROFLOXACIN <=0.25 SENSITIVE Sensitive     GENTAMICIN <=1 SENSITIVE Sensitive     IMIPENEM <=0.25 SENSITIVE Sensitive     NITROFURANTOIN <=16 SENSITIVE Sensitive     TRIMETH/SULFA <=20 SENSITIVE Sensitive     AMPICILLIN/SULBACTAM 4 SENSITIVE Sensitive     PIP/TAZO <=4 SENSITIVE Sensitive     * >=100,000 COLONIES/mL KLEBSIELLA PNEUMONIAE  Culture, blood (routine x 2)     Status: None (Preliminary result)   Collection Time: 05/31/21  8:19 PM   Specimen: Left Antecubital; Blood  Result Value Ref Range Status   Specimen Description LEFT ANTECUBITAL  Final   Special Requests   Final    BOTTLES DRAWN AEROBIC AND ANAEROBIC Blood Culture adequate volume   Culture   Final    NO GROWTH 3 DAYS Performed at [facility], [address] [place], [place] [address]    Report Status PENDING  Incomplete  Culture, blood (routine x 2)     Status: None (Preliminary result)   Collection Time: 05/31/21 10:16 PM   Specimen: Left Antecubital; Blood  Result Value Ref Range Status   Specimen Description LEFT ANTECUBITAL  Final   Special Requests   Final    BOTTLES DRAWN AEROBIC AND ANAEROBIC Blood Culture adequate volume   Culture   Final    NO GROWTH 3 DAYS Performed at [facility], [address], [place],  [address]    Report Status PENDING  Incomplete     Labs: BNP  (last 3 results) Recent Labs    05/31/21 1702  BNP 81.8   Basic Metabolic Panel: Recent Labs  Lab 05/31/21 1702 06/01/21 0421 06/02/21 0453 06/03/21 0454  NA 131* 134* 136 135  K 3.7 3.9 3.6 3.5  CL 91* 96* 99 99  CO2 27 29 25 23   GLUCOSE 353* 295* 101* 127*  BUN 14 14 12 14   CREATININE 0.90 0.76 0.72 0.72  CALCIUM 9.6 8.8* 8.8* 9.1   Liver Function Tests: Recent Labs  Lab 05/31/21 1702 06/02/21 0453  AST 44* 325*  ALT 51* 179*  ALKPHOS 131* 331*  BILITOT 0.6 0.2*  PROT 7.7 6.3*  ALBUMIN 2.9* 2.3*   Recent Labs  Lab 05/31/21 1702  LIPASE 20   No results for input(s): AMMONIA in the last 168 hours. CBC: Recent Labs  Lab 05/31/21 1702 06/01/21 0421 06/02/21 0453  WBC 14.9* 11.9* 10.6*  NEUTROABS 12.6*  --  8.3*  HGB 12.8 11.3* 11.0*  HCT 39.0 34.8* 33.7*  MCV 81.6 81.5 82.8  PLT 327 282 294   Cardiac Enzymes: No results for input(s): CKTOTAL, CKMB, CKMBINDEX, TROPONINI in the last 168 hours. BNP: Invalid input(s): POCBNP CBG: Recent Labs  Lab 06/02/21 1601 06/02/21 2117 06/03/21 0306 06/03/21 0732 06/03/21 1130  GLUCAP 194* 127* 141* 97 227*   D-Dimer No results for input(s): DDIMER in the last 72 hours. Hgb A1c Recent Labs    05/31/21 2217  HGBA1C 13.5*   Lipid Profile No results for input(s): CHOL, HDL, LDLCALC, TRIG, CHOLHDL, LDLDIRECT  in the last 72 hours. Thyroid function studies No results for input(s): TSH, T4TOTAL, T3FREE, THYROIDAB in the last 72 hours.  Invalid input(s): FREET3 Anemia work up No results for input(s): VITAMINB12, FOLATE, FERRITIN, TIBC, IRON, RETICCTPCT in the last 72 hours. Urinalysis    Component Value Date/Time   COLORURINE YELLOW 05/31/2021 1848   APPEARANCEUR CLEAR 05/31/2021 1848   LABSPEC <1.005 (L) 05/31/2021 1848   PHURINE 6.0 05/31/2021 1848   GLUCOSEU 250 (A) 05/31/2021 1848   HGBUR MODERATE (A) 05/31/2021 [place] 05/31/2021 1848   BILIRUBINUR negative 12/28/2020 1732    KETONESUR 15 (A) 05/31/2021 1848   PROTEINUR 100 (A) 05/31/2021 1848   UROBILINOGEN 1.0 12/28/2020 1732   UROBILINOGEN 0.2 03/25/2013 1445   NITRITE NEGATIVE 05/31/2021 1848   LEUKOCYTESUR NEGATIVE 05/31/2021 1848   Sepsis Labs Invalid input(s): PROCALCITONIN,  WBC,  LACTICIDVEN Microbiology Recent Results (from the past 240 hour(s))  Resp Panel by RT-PCR (Flu A&B, Covid) Nasopharyngeal Swab     Status: None   Collection Time: 05/31/21  5:07 PM   Specimen: Nasopharyngeal Swab; Nasopharyngeal(NP) swabs in vial transport medium  Result Value Ref Range Status   SARS Coronavirus 2 by RT PCR NEGATIVE NEGATIVE Final    Comment: (NOTE) SARS-CoV-2 target nucleic acids are NOT DETECTED.  The SARS-CoV-2 RNA is generally detectable in upper respiratory specimens during the acute phase of infection. The lowest concentration of SARS-CoV-2 viral copies this assay can detect is 138 copies/mL. A negative result does not preclude SARS-Cov-2 infection and should not be used as the sole basis for treatment or other patient management decisions. A negative result may occur with  improper specimen collection/handling, submission of specimen other than nasopharyngeal swab, presence of viral mutation(s) within the areas targeted by this assay, and inadequate number of viral copies(<138 copies/mL). A negative result must be combined with clinical observations, patient history, and epidemiological information. The expected result is Negative.  Fact Sheet for Patients:  EntrepreneurPulse.com.au  Fact Sheet for Healthcare Providers:  IncredibleEmployment.be  This test is no t yet approved or cleared by the [place] FDA and  has been authorized for detection and/or diagnosis of SARS-CoV-2 by FDA under an Emergency Use Authorization (EUA). This EUA will remain  in effect (meaning this test can be used) for the duration of the COVID-19 declaration under Section  564(b)(1) of the Act, 21 U.S.C.section 360bbb-3(b)(1), unless the authorization is terminated  or revoked sooner.       Influenza A by PCR NEGATIVE NEGATIVE Final   Influenza B by PCR NEGATIVE NEGATIVE Final    Comment: (NOTE) The Xpert Xpress SARS-CoV-2/FLU/RSV plus assay is intended as an aid in the diagnosis of influenza from Nasopharyngeal swab specimens and should not be used as a sole basis for treatment. Nasal washings and aspirates are unacceptable for Xpert Xpress SARS-CoV-2/FLU/RSV testing.  Fact Sheet for Patients: EntrepreneurPulse.com.au  Fact Sheet for Healthcare Providers: IncredibleEmployment.be  This test is not yet approved or cleared by the [place] FDA and has been authorized for detection and/or diagnosis of SARS-CoV-2 by FDA under an Emergency Use Authorization (EUA). This EUA will remain in effect (meaning this test can be used) for the duration of the COVID-19 declaration under Section 564(b)(1) of the Act, 21 U.S.C. section 360bbb-3(b)(1), unless the authorization is terminated or revoked.  Performed at [facility], [address], [place], [place] [address]   Urine Culture     Status: Abnormal   Collection Time: 05/31/21  6:48 PM  Specimen: Urine, Clean Catch  Result Value Ref Range Status   Specimen Description   Final    URINE, CLEAN CATCH Performed at [facility], [address] [place], [place] [address]    Special Requests   Final    NONE Performed at [facility], [address] [place], [place] [address]    Culture >=100,000 COLONIES/mL KLEBSIELLA PNEUMONIAE (A)  Final   Report Status 06/03/2021 FINAL  Final   Organism ID, Bacteria KLEBSIELLA PNEUMONIAE (A)  Final      Susceptibility   Klebsiella pneumoniae - MIC*    AMPICILLIN RESISTANT Resistant     CEFAZOLIN <=4 SENSITIVE Sensitive     CEFEPIME <=0.12 SENSITIVE Sensitive     CEFTRIAXONE <=0.25 SENSITIVE Sensitive     CIPROFLOXACIN <=0.25  SENSITIVE Sensitive     GENTAMICIN <=1 SENSITIVE Sensitive     IMIPENEM <=0.25 SENSITIVE Sensitive     NITROFURANTOIN <=16 SENSITIVE Sensitive     TRIMETH/SULFA <=20 SENSITIVE Sensitive     AMPICILLIN/SULBACTAM 4 SENSITIVE Sensitive     PIP/TAZO <=4 SENSITIVE Sensitive     * >=100,000 COLONIES/mL KLEBSIELLA PNEUMONIAE  Culture, blood (routine x 2)     Status: None (Preliminary result)   Collection Time: 05/31/21  8:19 PM   Specimen: Left Antecubital; Blood  Result Value Ref Range Status   Specimen Description LEFT ANTECUBITAL  Final   Special Requests   Final    BOTTLES DRAWN AEROBIC AND ANAEROBIC Blood Culture adequate volume   Culture   Final    NO GROWTH 3 DAYS Performed at [facility] [address], [place], [place] [address]    Report Status PENDING  Incomplete  Culture, blood (routine x 2)     Status: None (Preliminary result)   Collection Time: 05/31/21 10:16 PM   Specimen: Left Antecubital; Blood  Result Value Ref Range Status   Specimen Description LEFT ANTECUBITAL  Final   Special Requests   Final    BOTTLES DRAWN AEROBIC AND ANAEROBIC Blood Culture adequate volume   Culture   Final    NO GROWTH 3 DAYS Performed at [facility], [address], [place], [place] [address]    Report Status PENDING  Incomplete    Time coordinating discharge: 37 mins   SIGNED:  Irwin Brakeman, MD  [facility] 06/03/2021, 11:34 AM How to contact the [facility] Attending or Consulting provider 7A - 7P or covering provider during after hours 7P -7A, for this patient?  Check the care team in [facility] and look for a) attending/consulting TRH provider listed and b) the [facility] team listed Log into [email or web address] and use [place] universal password to access. If you do not have the password, please contact the hospital operator. Locate the [facility] provider you are looking for under [facility] and page to a number that you can be directly reached. If you still have difficulty reaching the  provider, please page the [facility] (Director on Call) for the Hospitalists listed on amion for assistance.

## 2021-06-05 LAB — CULTURE, BLOOD (ROUTINE X 2)
Culture: NO GROWTH
Culture: NO GROWTH
Special Requests: ADEQUATE
Special Requests: ADEQUATE

## 2021-06-11 DIAGNOSIS — N10 Acute tubulo-interstitial nephritis: Secondary | ICD-10-CM | POA: Diagnosis not present

## 2021-06-11 DIAGNOSIS — I10 Essential (primary) hypertension: Secondary | ICD-10-CM | POA: Diagnosis not present

## 2021-06-11 DIAGNOSIS — E1165 Type 2 diabetes mellitus with hyperglycemia: Secondary | ICD-10-CM | POA: Diagnosis not present

## 2021-06-18 DIAGNOSIS — I10 Essential (primary) hypertension: Secondary | ICD-10-CM | POA: Diagnosis not present

## 2021-06-18 DIAGNOSIS — E1165 Type 2 diabetes mellitus with hyperglycemia: Secondary | ICD-10-CM | POA: Diagnosis not present

## 2021-06-20 ENCOUNTER — Encounter: Payer: Self-pay | Admitting: "Endocrinology

## 2021-06-20 ENCOUNTER — Ambulatory Visit (INDEPENDENT_AMBULATORY_CARE_PROVIDER_SITE_OTHER): Payer: BC Managed Care – PPO | Admitting: "Endocrinology

## 2021-06-20 ENCOUNTER — Other Ambulatory Visit: Payer: Self-pay

## 2021-06-20 VITALS — BP 124/74 | HR 88 | Ht 61.0 in | Wt 223.4 lb

## 2021-06-20 DIAGNOSIS — E782 Mixed hyperlipidemia: Secondary | ICD-10-CM

## 2021-06-20 DIAGNOSIS — E1165 Type 2 diabetes mellitus with hyperglycemia: Secondary | ICD-10-CM

## 2021-06-20 DIAGNOSIS — I10 Essential (primary) hypertension: Secondary | ICD-10-CM

## 2021-06-20 NOTE — Progress Notes (Signed)
06/20/2021, 4:59 PM   Endocrinology follow-up note   Subjective:    Patient ID: [name], female    DOB: 07/14/1960.  [name] is being seen in follow-up in the management of her currently uncontrolled type 2 diabetes, hypertension. VQQ:VZDGL, Tesfaye, MD.   Past Medical History:  Diagnosis Date   Allergy    Asthma    COPD (chronic obstructive pulmonary disease) ([place])    Diabetes mellitus without complication ([place])    Hypertension    Morbid obesity due to excess calories ([place])    Umbilical hernia     Past Surgical History:  Procedure Laterality Date   CARPAL TUNNEL RELEASE Right    CHOLECYSTECTOMY     COLONOSCOPY N/A 01/12/2015   Procedure: COLONOSCOPY;  Surgeon: Danie Binder, MD;  Location: [facility] END[facility] SU[facility];  Service: Endoscopy;  Laterality: N/A;  0830   HYSTEROSCOPY WITH D & C N/A 04/01/2013   Procedure: DILATATION AND CURETTAGE /HYSTEROSCOPY;  Surgeon: Florian Buff, MD;  Location: [facility] ORS;  Service: Gynecology;  Laterality: N/A;   NASAL POLYP SURGERY      Social History   Socioeconomic History   Marital status: Married    Spouse name: Not on file   Number of children: 2   Years of education: Not on file   Highest education level: Not on file  Occupational History   Not on file  Tobacco Use   Smoking status: Former    Packs/day: 0.50    Years: 20.00    Pack years: 10.00    Types: Cigarettes    Quit date: 03/25/1997    Years since quitting: 24.2   Smokeless tobacco: Never  Vaping Use   Vaping Use: Never used  Substance and Sexual Activity   Alcohol use: Yes    Alcohol/week: 0.0 standard drinks    Comment: Ocassional   Drug use: No   Sexual activity: Yes    Birth control/protection: None, Post-menopausal  Other Topics Concern   Not on file  Social History Narrative   Not on file   Social Determinants of Health   Financial Resource Strain: Not on file  Food  Insecurity: Not on file  Transportation Needs: Not on file  Physical Activity: Not on file  Stress: Not on file  Social Connections: Not on file    Family History  Problem Relation Age of Onset   Diabetes Mother    Kidney disease Mother    Other Mother        renal failure   Hypertension Mother    Cancer Mother        ?uterine cancer    Diabetes Father    Cancer Father        pancreatic   Diabetes Sister    Kidney disease Sister    Diabetes Maternal Grandmother    Asthma Maternal Grandmother    COPD Maternal Grandmother        bronchitis   Hypertension Maternal Grandmother    Thyroid disease Maternal Grandmother    Hypertension Daughter    Colon cancer Neg Hx     Outpatient Encounter Medications as of 06/20/2021  Medication Sig   Insulin  Glargine (TOUJEO MAX SOLOSTAR [facility] [place]) Inject 100 Units into the skin at bedtime.   acetaZOLAMIDE (DIAMOX) 250 MG tablet Take 250 mg by mouth 2 (two) times daily.   amLODipine (NORVASC) 5 MG tablet Take 1 tablet by mouth daily.   aspirin EC 81 MG tablet Take 81 mg by mouth daily.   B-D UF III MINI PEN NEEDLES 31G X 5 MM MISC USE ONCE DAILY   diclofenac sodium (VOLTAREN) 1 % GEL 4 (four) times daily as needed.   fluticasone (FLONASE) 50 MCG/ACT nasal spray Place 2 sprays into both nostrils daily.   Fluticasone-Salmeterol (ADVAIR) 250-50 MCG/DOSE AEPB Inhale 1 puff into the lungs 2 (two) times daily.    furosemide (LASIX) 20 MG tablet Take 20 mg by mouth daily. (Patient not taking: Reported on 06/20/2021)   gabapentin (NEURONTIN) 100 MG capsule Take 1 capsule by mouth 2 (two) times daily.   glipiZIDE (GLUCOTROL XL) 5 MG 24 hr tablet TAKE 1 TABLET BY MOUTH EVERY DAY WITH BREAKFAST   Ipratropium-Albuterol (COMBIVENT RESPIMAT IN) Inhale 1 puff into the lungs daily as needed (shortness of breath).   ipratropium-albuterol (DUONEB) 0.5-2.5 (3) MG/3ML SOLN Take 3 mLs by nebulization every 4 (four) hours as needed (Shortness of Breath).     losartan-hydrochlorothiazide (HYZAAR) 100-25 MG tablet Take 1 tablet by mouth daily.   metFORMIN (GLUCOPHAGE) 500 MG tablet Take 2 tablets (1,000 mg total) by mouth 2 (two) times daily with a meal.   Multiple Vitamin (MULTIVITAMIN WITH MINERALS) TABS tablet Take 1 tablet by mouth daily.   rosuvastatin (CRESTOR) 20 MG tablet Take 0.5 tablets (10 mg total) by mouth daily.   traMADol (ULTRAM) 50 MG tablet Take 50 mg by mouth 2 (two) times daily.   [DISCONTINUED] insulin glargine, 2 Unit Dial, (TOUJEO MAX SOLOSTAR) 300 UNIT/ML Solostar Pen Inject 50 Units into the skin at bedtime.   No facility-administered encounter medications on file as of 06/20/2021.    ALLERGIES: No Known Allergies  VACCINATION STATUS: Immunization History  Administered Date(s) Administered   Influenza-Unspecified 06/09/2010, 06/09/2013    Diabetes She presents for her follow-up diabetic visit. She has type 2 diabetes mellitus. Onset time: She was diagnosed at approximate age of 61 years. Her disease course has been worsening. There are no hypoglycemic associated symptoms. Pertinent negatives for hypoglycemia include no confusion, headaches, pallor or seizures. Associated symptoms include polydipsia and polyuria. Pertinent negatives for diabetes include no chest pain, no fatigue and no polyphagia. There are no hypoglycemic complications. Symptoms are worsening. There are no diabetic complications. Risk factors for coronary artery disease include diabetes mellitus, dyslipidemia, family history, obesity, tobacco exposure, sedentary lifestyle, post-menopausal and hypertension. Current diabetic treatment includes insulin injections (She is currently on Basaglar 60 units twice daily,  Metformin 1000 mg p.o. twice daily, could not afford Januvia any longer, she remains on glipizide 5 mg XL p.o. daily breakfast.  ). Her weight is stable. She is following a generally unhealthy diet. When asked about meal planning, she reported none. She  has not had a previous visit with a dietitian. She rarely participates in exercise. Her home blood glucose trend is decreasing steadily. (She presents with incomplete glycemic profile.  Her recent A1c was 13.5%.  She denies hypoglycemia.  She does not monitor blood glucose as recommended.     ) An ACE inhibitor/angiotensin II receptor blocker is being taken.  Hyperlipidemia This is a chronic problem. The current episode started more than 1 year ago. The problem is controlled. Exacerbating diseases include diabetes  and obesity. Pertinent negatives include no chest pain, myalgias or shortness of breath. Current antihyperlipidemic treatment includes statins. Risk factors for coronary artery disease include diabetes mellitus, dyslipidemia, family history, hypertension, obesity, a sedentary lifestyle and post-menopausal.  Hypertension This is a chronic problem. The current episode started more than 1 year ago. Pertinent negatives include no chest pain, headaches, palpitations or shortness of breath. Risk factors for coronary artery disease include diabetes mellitus, dyslipidemia, obesity, post-menopausal state, smoking/tobacco exposure, sedentary lifestyle and family history. Past treatments include ACE inhibitors.   Review of systems  Constitutional: + Minimally fluctuating body weight,  current  Body mass index is 42.21 kg/m. , no fatigue, no subjective hyperthermia, no subjective hypothermia    Objective:    BP 124/74    Pulse 88    Ht 5\' 1"  (1.549 m)    Wt 223 lb 6.4 oz (101.3 kg)    LMP 02/25/2019    BMI 42.21 kg/m   Wt Readings from Last 3 Encounters:  06/20/21 223 lb 6.4 oz (101.3 kg)  05/31/21 215 lb (97.5 kg)  05/05/21 228 lb (103.4 kg)    Physical Exam- Limited  Constitutional:  Body mass index is 42.21 kg/m. , not in acute distress, normal state of mind    CMP     Component Value Date/Time   NA 135 06/03/2021 0454   NA 138 01/24/2021 0000   K 3.5 06/03/2021 0454   CL 99  06/03/2021 0454   CO2 23 06/03/2021 0454   GLUCOSE 127 (H) 06/03/2021 0454   BUN 14 06/03/2021 0454   BUN 14 01/24/2021 0000   CREATININE 0.72 06/03/2021 0454   CALCIUM 9.1 06/03/2021 0454   PROT 6.3 (L) 06/02/2021 0453   PROT 6.9 11/07/2020 0735   ALBUMIN 2.3 (L) 06/02/2021 0453   ALBUMIN 4.2 11/07/2020 0735   AST 325 (H) 06/02/2021 0453   ALT 179 (H) 06/02/2021 0453   ALKPHOS 331 (H) 06/02/2021 0453   BILITOT 0.2 (L) 06/02/2021 0453   BILITOT <0.2 11/07/2020 0735   GFRNONAA >60 06/03/2021 0454   GFRAA >60 04/21/2019 0854     Diabetic Labs (most recent): Lab Results  Component Value Date   HGBA1C 13.5 (H) 05/31/2021   HGBA1C 11.3 04/26/2021   HGBA1C 11.8 01/24/2021     Lipid Panel ( most recent) Lipid Panel     Component Value Date/Time   CHOL 113 01/24/2021 0000   CHOL 128 11/07/2020 0735   TRIG 102 01/24/2021 0000   HDL 35 01/24/2021 0000   HDL 38 (L) 11/07/2020 0735   CHOLHDL 3.4 11/07/2020 0735   CHOLHDL 3.5 04/21/2019 0855   VLDL 28 04/21/2019 0855   LDLCALC 59 01/24/2021 0000   LDLCALC 68 11/07/2020 0735   LABVLDL 22 11/07/2020 0735      Lab Results  Component Value Date   TSH 1.210 11/07/2020   TSH 2.110 01/16/2020   TSH 3.399 03/07/2013   FREET4 1.13 11/07/2020   FREET4 1.06 01/16/2020     Assessment & Plan:   1. Uncontrolled type 2 diabetes mellitus with hyperglycemia ([place])  - [name] has currently uncontrolled symptomatic type 2 DM since 61 years of age.  She presents with incomplete glycemic profile.  Her recent A1c was 13.5%.  She denies hypoglycemia.  She does not monitor blood glucose as recommended.    - I had a long discussion with her about the progressive nature of diabetes and the pathology behind its complications. -her diabetes is complicated by  obesity, non- engagement for management plan,  history of smoking and she remains at a high risk for more acute and chronic complications which include CAD, CVA, CKD, retinopathy,  and neuropathy. These are all discussed in detail with her.  - I have counseled her on diet  and weight management  by adopting a carbohydrate restricted/protein rich diet. Patient is encouraged to switch to  unprocessed or minimally processed     complex starch and increased protein intake (animal or plant source), fruits, and vegetables. -  she is advised to stick to a routine mealtimes to eat 3 meals  a day and avoid unnecessary snacks ( to snack only to correct hypoglycemia).   - she acknowledges that there is a room for improvement in her food and drink choices. - Suggestion is made for her to avoid simple carbohydrates  from her diet including Cakes, Sweet Desserts, Ice Cream, Soda (diet and regular), Sweet Tea, Candies, Chips, Cookies, Store Bought Juices, Alcohol , Artificial Sweeteners,  Coffee Creamer, and "Sugar-free" Products, Lemonade. This will help patient to have more stable blood glucose profile and potentially avoid unintended weight gain.  The following Lifestyle Medicine recommendations according to [facility]) were discussed and and offered to patient and she  agrees to start the journey:  A. Whole Foods, Plant-Based Nutrition comprising of fruits and vegetables, plant-based proteins, whole-grain carbohydrates was discussed in detail with the patient.   A list for source of those nutrients were also provided to the patient.  Patient will use only water or unsweetened tea for hydration. B.  The need to stay away from risky substances including alcohol, smoking; obtaining 7 to 9 hours of restorative sleep, at least 150 minutes of moderate intensity exercise weekly, the importance of healthy social connections,  and stress management techniques were discussed.   - she has been scheduled with Jearld Fenton, RDN, CDE for diabetes education.  - I have approached her with the following individualized plan to manage  her diabetes and patient agrees:     -In light of her presentation with severe glycemic burden, she will need intensive treatment with basal/bolus insulin, however she has difficulty executing.  She will be considered for premixed insulin twice a day after she finishes her current supplies of Toujeo. It appears that she has enough for 6 weeks.  She is advised to lower her Toujeo to 100 units nightly, continue to monitor blood glucose at least twice a day-daily before breakfast and at bedtime. -This patient will benefit from a CGM.  She will be considered for the freestyle libre device next visit. - she is encouraged to call clinic for blood glucose levels less than 70 or above 200 mg /dl. - she is advised to continue Metformin 500 mg p.o. twice daily, glipizide 5 mg XL p.o. daily at breakfast.    - Specific targets for  A1c;  LDL, HDL,  and Triglycerides were discussed with the patient.  2) Blood Pressure /Hypertension:   -Her blood pressure is controlled to target.  Amlodipine 5 mg was recently added to her Hyzaar.  She is advised to continue  losartan/HCTZ 100/25 milligrams p.o. daily.   3) Lipids/Hyperlipidemia:   Review of her recent lipid panel showed  controlled  LDL at 68 .  she  is advised to continue Crestor 20 mg p.o. daily at bedtime.   Side effects and precautions discussed with her.    4)  Weight/Diet: Her BMI is 42.2---  clearly complicating her diabetes care.   she is  a candidate for modest weight loss. I discussed with her the fact that loss of 5 - 10% of her  current body weight will have the most impact on her diabetes management.  Exercise, and detailed carbohydrates information provided  -  detailed on discharge instructions.  5) Chronic Care/Health Maintenance:  -she  is on ACEI/ARB and Statin medications and  is encouraged to initiate and continue to follow up with Ophthalmology, Dentist,  Podiatrist at least yearly or according to recommendations, and advised to  stay away from smoking. I have  recommended yearly flu vaccine and pneumonia vaccine at least every 5 years; moderate intensity exercise for up to 150 minutes weekly; and  sleep for at least 7 hours a day.  Her ABI was recently normal on April 26, 2020.  - she is  advised to maintain close follow up with Rosita Fire, MD for primary care needs, as well as her other providers for optimal and coordinated care.   I spent 41 minutes in the care of the patient today including review of labs from [place], Lipids, Thyroid Function, Hematology (current and previous including abstractions from other facilities); face-to-face time discussing  her blood glucose readings/logs, discussing hypoglycemia and hyperglycemia episodes and symptoms, medications doses, her options of short and long term treatment based on the latest standards of care / guidelines;  discussion about incorporating lifestyle medicine;  and documenting the encounter.    Please refer to Patient Instructions for Blood Glucose Monitoring and Insulin/Medications Dosing Guide"  in media tab for additional information. Please  also refer to " Patient Self Inventory" in the Media  tab for reviewed elements of pertinent patient history.  [name] participated in the discussions, expressed understanding, and voiced agreement with the above plans.  All questions were answered to her satisfaction. she is encouraged to contact clinic should she have any questions or concerns prior to her return visit.  Follow up plan: - Return in about 6 weeks (around 08/01/2021) for F/U with Meter and Logs Only - no Labs.  Glade Lloyd, MD [facility] [address] [place], [place] [address] Phone: [phone]  Fax: [phone]    06/20/2021, 4:59 PM  This note was partially dictated with voice recognition software. Similar sounding words can be transcribed inadequately or may not  be corrected upon review.

## 2021-06-20 NOTE — Patient Instructions (Signed)

## 2021-07-04 DIAGNOSIS — G9601 Cranial cerebrospinal fluid leak, spontaneous: Secondary | ICD-10-CM | POA: Diagnosis not present

## 2021-07-04 DIAGNOSIS — G932 Benign intracranial hypertension: Secondary | ICD-10-CM | POA: Diagnosis not present

## 2021-07-04 DIAGNOSIS — E1165 Type 2 diabetes mellitus with hyperglycemia: Secondary | ICD-10-CM | POA: Diagnosis not present

## 2021-07-04 DIAGNOSIS — Q010 Frontal encephalocele: Secondary | ICD-10-CM | POA: Diagnosis not present

## 2021-07-11 ENCOUNTER — Other Ambulatory Visit: Payer: Self-pay | Admitting: "Endocrinology

## 2021-07-18 ENCOUNTER — Emergency Department (HOSPITAL_COMMUNITY): Payer: BC Managed Care – PPO

## 2021-07-18 ENCOUNTER — Emergency Department (HOSPITAL_COMMUNITY)
Admission: EM | Admit: 2021-07-18 | Discharge: 2021-07-18 | Disposition: A | Discharge status: Home / Self Care | Payer: BC Managed Care – PPO | Attending: Emergency Medicine | Admitting: Emergency Medicine

## 2021-07-18 ENCOUNTER — Encounter (HOSPITAL_COMMUNITY): Payer: Self-pay | Admitting: *Deleted

## 2021-07-18 DIAGNOSIS — R519 Headache, unspecified: Secondary | ICD-10-CM | POA: Diagnosis not present

## 2021-07-18 DIAGNOSIS — Z7984 Long term (current) use of oral hypoglycemic drugs: Secondary | ICD-10-CM | POA: Insufficient documentation

## 2021-07-18 DIAGNOSIS — I440 Atrioventricular block, first degree: Secondary | ICD-10-CM | POA: Diagnosis not present

## 2021-07-18 DIAGNOSIS — R1010 Upper abdominal pain, unspecified: Secondary | ICD-10-CM | POA: Insufficient documentation

## 2021-07-18 DIAGNOSIS — R55 Syncope and collapse: Secondary | ICD-10-CM | POA: Diagnosis not present

## 2021-07-18 DIAGNOSIS — Z7982 Long term (current) use of aspirin: Secondary | ICD-10-CM | POA: Insufficient documentation

## 2021-07-18 DIAGNOSIS — Z794 Long term (current) use of insulin: Secondary | ICD-10-CM | POA: Insufficient documentation

## 2021-07-18 DIAGNOSIS — E119 Type 2 diabetes mellitus without complications: Secondary | ICD-10-CM | POA: Diagnosis not present

## 2021-07-18 DIAGNOSIS — Z79899 Other long term (current) drug therapy: Secondary | ICD-10-CM | POA: Diagnosis not present

## 2021-07-18 DIAGNOSIS — R112 Nausea with vomiting, unspecified: Secondary | ICD-10-CM | POA: Insufficient documentation

## 2021-07-18 DIAGNOSIS — I10 Essential (primary) hypertension: Secondary | ICD-10-CM | POA: Diagnosis not present

## 2021-07-18 DIAGNOSIS — R059 Cough, unspecified: Secondary | ICD-10-CM | POA: Diagnosis not present

## 2021-07-18 LAB — CBG MONITORING, ED: Glucose-Capillary: 134 mg/dL — ABNORMAL HIGH (ref 70–99)

## 2021-07-18 LAB — LIPASE, BLOOD: Lipase: 27 U/L (ref 11–51)

## 2021-07-18 LAB — COMPREHENSIVE METABOLIC PANEL
ALT: 30 U/L (ref 0–44)
AST: 29 U/L (ref 15–41)
Albumin: 4.1 g/dL (ref 3.5–5.0)
Alkaline Phosphatase: 69 U/L (ref 38–126)
Anion gap: 9 (ref 5–15)
BUN: 16 mg/dL (ref 6–20)
CO2: 25 mmol/L (ref 22–32)
Calcium: 10 mg/dL (ref 8.9–10.3)
Chloride: 103 mmol/L (ref 98–111)
Creatinine, Ser: 0.77 mg/dL (ref 0.44–1.00)
GFR, Estimated: >60 mL/min (ref >60)
Glucose, Bld: 140 mg/dL — ABNORMAL HIGH (ref 70–99)
Potassium: 4.1 mmol/L (ref 3.5–5.1)
Sodium: 137 mmol/L (ref 135–145)
Total Bilirubin: 0.5 mg/dL (ref 0.3–1.2)
Total Protein: 7.4 g/dL (ref 6.5–8.1)

## 2021-07-18 LAB — CBC
HCT: 42.4 % (ref 36.0–46.0)
Hemoglobin: 13.7 g/dL (ref 12.0–15.0)
MCH: 27.5 pg (ref 26.0–34.0)
MCHC: 32.3 g/dL (ref 30.0–36.0)
MCV: 85.1 fL (ref 80.0–100.0)
Platelets: 320 10*3/uL (ref 150–400)
RBC: 4.98 MIL/uL (ref 3.87–5.11)
RDW: 14.7 % (ref 11.5–15.5)
WBC: 11.5 10*3/uL — ABNORMAL HIGH (ref 4.0–10.5)
nRBC: 0 % (ref 0.0–0.2)

## 2021-07-18 LAB — URINALYSIS, ROUTINE W REFLEX MICROSCOPIC
Bilirubin Urine: NEGATIVE
Glucose, UA: NEGATIVE mg/dL
Hgb urine dipstick: NEGATIVE
Ketones, ur: 5 mg/dL — AB
Leukocytes,Ua: NEGATIVE
Nitrite: NEGATIVE
Protein, ur: NEGATIVE mg/dL
Specific Gravity, Urine: 1.011 (ref 1.005–1.030)
pH: 5 (ref 5.0–8.0)

## 2021-07-18 MED ORDER — SODIUM CHLORIDE 0.9 % IV BOLUS
1000.0000 mL | Freq: Once | INTRAVENOUS | Status: AC
  Administered 2021-07-18: 1000 mL via INTRAVENOUS

## 2021-07-18 NOTE — Discharge Instructions (Addendum)
You came to the emergency department today to be evaluated for your syncopal episode.  Your physical exam and lab results were reassuring.  The EKG obtained showed that you were in first-degree AV block.  Due to this new EKG changes we have given you information to follow-up with a cardiologist.  Is also very importantly follow-up closely with your primary care doctor provider for repeat evaluation after syncopal episode.  Get help right away if: You faint. You hit your head or are injured after fainting. You have any of these symptoms that may indicate trouble with your heart: Fast or irregular heartbeats (palpitations). Unusual pain in your chest, abdomen, or back. Shortness of breath. You have a seizure. You have a severe headache. You are confused. You have vision problems. You have severe weakness or trouble walking. You are bleeding from your mouth or rectum, or you have black or tarry stool.

## 2021-07-18 NOTE — ED Provider Notes (Signed)
[facility] EMERGENCY DEPARTMENT Provider Note   CSN: [id number] Arrival date & time: 07/18/21  1832     History  Chief Complaint  Patient presents with   Loss of Consciousness    [name] is a 61 y.o. female with a history of intracranial idiopathic hypertension, CSF rhinorrhea, frontal sinus encephalocele, well-controlled type 2 diabetes, hypertension, hyperlipidemia.  Presents emergency department with a chief complaint of syncopal episode.  Patient reports that today at 1830 she was sitting at the table when she started having some abdominal cramping became nauseous and felt lightheaded.  Husband were reports that patient fell out of the chair she was sitting and he came into the room immediately after and patient was alert.  No preceding chest pain, shortness of breath, palpitations, sudden onset of headache.  Patient reports that she developed a frontal temporal headache after this event.  Headache onset was gradual pain progressively worse over time.  No aggravating factors.  Patient has not tried any modalities to alleviate her headache.  No associated numbness, weakness, facial asymmetry, dysarthria, visual disturbance.  Patient reports that abdominal cramping has gradually improved since her episode of passing out.  Currently rates pain 3/10 on the pain scale.  Pain is located to the upper abdomen.  Patient states that she did have some diarrhea and vomiting on Monday and Tuesday.  Emesis was described as stomach contents and bilious.  No blood in stool or melena.  No recent antibiotics, camping, or international travel.  Additionally patient reports that she has had productive cough for the last 2 days.  Denies any associated congestion, sore throat, fever, chills, shortness of breath.   Loss of Consciousness Associated symptoms: nausea and vomiting   Associated symptoms: no chest pain, no confusion, no dizziness, no fever, no headaches, no seizures, no shortness of breath  and no weakness       Home Medications Prior to Admission medications   Medication Sig Start Date End Date Taking? Authorizing Provider  acetaZOLAMIDE (DIAMOX) 250 MG tablet Take 250 mg by mouth 2 (two) times daily. 06/06/21  Yes [provider]  amLODipine (NORVASC) 5 MG tablet Take 1 tablet by mouth daily. 10/09/20  Yes [provider]  aspirin 81 MG EC tablet Take 1 tablet by mouth daily.   Yes [provider]  diclofenac sodium (VOLTAREN) 1 % GEL 4 (four) times daily as needed. 04/03/19  Yes [provider]  fluticasone (FLONASE) 50 MCG/ACT nasal spray Place 2 sprays into both nostrils daily. 05/21/21  Yes [provider]  Fluticasone-Salmeterol (ADVAIR) 250-50 MCG/DOSE AEPB Inhale 1 puff into the lungs 2 (two) times daily.    Yes [provider]  gabapentin (NEURONTIN) 100 MG capsule Take 1 capsule by mouth 2 (two) times daily. 11/21/20  Yes [provider]  glipiZIDE (GLUCOTROL XL) 5 MG 24 hr tablet TAKE 1 TABLET BY MOUTH EVERY DAY WITH BREAKFAST 03/13/21  Yes Nida, Marella Chimes, MD  Insulin Glargine (TOUJEO MAX SOLOSTAR [place]) Inject 100 Units into the skin at bedtime.   Yes [provider]  ipratropium (ATROVENT) 0.06 % nasal spray Place 2 sprays into both nostrils 2 (two) times daily. 07/11/21  Yes [provider]  Ipratropium-Albuterol (COMBIVENT RESPIMAT IN) Inhale 1 puff into the lungs daily as needed (shortness of breath).   Yes [provider]  ipratropium-albuterol (DUONEB) 0.5-2.5 (3) MG/3ML SOLN Take 3 mLs by nebulization every 4 (four) hours as needed (Shortness of Breath).    Yes [provider]  losartan-hydrochlorothiazide (HYZAAR) 100-25 MG tablet Take 1 tablet by mouth daily. 10/10/20  Yes [provider]  metFORMIN (GLUCOPHAGE) 500 MG tablet Take 1 tablet (500 mg total) by mouth 2 (two) times daily with a meal. 07/12/21  Yes Nida, Marella Chimes, MD  Multiple Vitamin  (MULTIVITAMIN WITH MINERALS) TABS tablet Take 1 tablet by mouth daily.   Yes [provider]  rosuvastatin (CRESTOR) 20 MG tablet Take 0.5 tablets (10 mg total) by mouth daily. 06/03/21  Yes Johnson, Clanford L, MD  traMADol (ULTRAM) 50 MG tablet Take 50 mg by mouth 2 (two) times daily. 08/04/15  Yes [provider]  B-D UF III MINI PEN NEEDLES 31G X 5 MM MISC USE ONCE DAILY 10/26/20   Cassandria Anger, MD      Allergies    Patient has no known allergies.    Review of Systems   Review of Systems  Constitutional:  Negative for chills and fever.  HENT:  Positive for rhinorrhea. Negative for congestion and sore throat.   Eyes:  Negative for visual disturbance.  Respiratory:  Positive for cough. Negative for shortness of breath.   Cardiovascular:  Positive for syncope. Negative for chest pain.  Gastrointestinal:  Positive for abdominal pain, diarrhea, nausea and vomiting. Negative for abdominal distention, anal bleeding, blood in stool, constipation and rectal pain.  Genitourinary:  Negative for difficulty urinating, dysuria, flank pain, frequency, genital sores, hematuria, urgency, vaginal bleeding, vaginal discharge and vaginal pain.  Musculoskeletal:  Negative for back pain and neck pain.  Skin:  Negative for color change and rash.  Neurological:  Positive for syncope. Negative for dizziness, tremors, seizures, facial asymmetry, speech difficulty, weakness, light-headedness, numbness and headaches.  Psychiatric/Behavioral:  Negative for confusion.    Physical Exam Updated Vital Signs BP 128/67    Pulse 83    Temp 97.6 F (36.4 C) (Oral)    Resp 15    LMP 02/25/2019    SpO2 97%  Physical Exam Vitals and nursing note reviewed.  Constitutional:      General: She is not in acute distress.    Appearance: She is not ill-appearing, toxic-appearing or diaphoretic.  HENT:     Head: Normocephalic. No raccoon eyes, Battle's sign, abrasion, contusion, right periorbital  erythema, left periorbital erythema or laceration.  Eyes:     General: No scleral icterus.       Right eye: No discharge.        Left eye: No discharge.  Cardiovascular:     Rate and Rhythm: Normal rate.     Pulses:          Radial pulses are 2+ on the right side and 2+ on the left side.  Pulmonary:     Effort: Pulmonary effort is normal. No tachypnea, bradypnea or respiratory distress.     Breath sounds: Normal breath sounds. No stridor.     Comments: Lungs clear to auscultation bilaterally Abdominal:     General: Bowel sounds are normal. There is no distension. There are no signs of injury.     Palpations: Abdomen is soft. There is no mass or pulsatile mass.     Tenderness: There is no abdominal tenderness. There is no guarding or rebound.     Hernia: There is no hernia in the umbilical area or ventral area.  Musculoskeletal:     Cervical back: Normal range of motion and neck supple.     Comments: No midline tenderness or deformity to cervical, thoracic, lumbar  spine.  Skin:    General: Skin is warm and dry.  Neurological:     General: No focal deficit present.     Mental Status: She is alert.     GCS: GCS eye subscore is 4. GCS verbal subscore is 5. GCS motor subscore is 6.     Comments: No facial asymmetry or dysarthria.  Able to move all limbs equally without difficulty.  Psychiatric:        Behavior: Behavior is cooperative.    ED Results / Procedures / Treatments   Labs (all labs ordered are listed, but only abnormal results are displayed) Labs Reviewed  CBC - Abnormal; Notable for the following components:      Result Value   WBC 11.5 (*)    All other components within normal limits  URINALYSIS, ROUTINE W REFLEX MICROSCOPIC - Abnormal; Notable for the following components:   Ketones, ur 5 (*)    All other components within normal limits  COMPREHENSIVE METABOLIC PANEL - Abnormal; Notable for the following components:   Glucose, Bld 140 (*)    All other components  within normal limits  CBG MONITORING, ED - Abnormal; Notable for the following components:   Glucose-Capillary 134 (*)    All other components within normal limits  LIPASE, BLOOD    EKG EKG Interpretation  Date/Time:  Thursday July 18 2021 19:04:51 EST Ventricular Rate:  79 PR Interval:  220 QRS Duration: 80 QT Interval:  364 QTC Calculation: 417 R Axis:   3 Text Interpretation: Sinus rhythm with 1st degree A-V block Nonspecific T wave abnormality Abnormal ECG When compared with ECG of 31-May-2021 18:54, PREVIOUS ECG IS PRESENT Confirmed by Noemi Chapel [phone]) on 07/18/2021 7:08:24 PM  Radiology CT Head Wo Contrast  Result Date: 07/18/2021 CLINICAL DATA:  Headache fall EXAM: CT HEAD WITHOUT CONTRAST TECHNIQUE: Contiguous axial images were obtained from the base of the skull through the vertex without intravenous contrast. RADIATION DOSE REDUCTION: This exam was performed according to the departmental dose-optimization program which includes automated exposure control, adjustment of the mA and/or kV according to patient size and/or use of iterative reconstruction technique. COMPARISON:  None. FINDINGS: Brain: No acute territorial infarction, hemorrhage or intracranial mass. The ventricles are nonenlarged. Vascular: No hyperdense vessels.  Carotid vascular calcification Skull: Normal. Negative for fracture or focal lesion. Sinuses/Orbits: Moderate mucosal disease in the maxillary and ethmoid sinuses with frothy debris in the right maxillary sinus Other: None IMPRESSION: 1. No CT evidence for acute intracranial abnormality. 2. Sinus disease Electronically Signed   By: Donavan Foil M.D.   On: 07/18/2021 20:15    Procedures Procedures    Medications Ordered in ED Medications  sodium chloride 0.9 % bolus 1,000 mL (1,000 mLs Intravenous New Bag/Given 07/18/21 2114)    ED Course/ Medical Decision Making/ A&P                           Medical Decision Making Amount and/or Complexity of  Data Reviewed Labs: ordered. Radiology: ordered.   Alert 61 year old female in no acute stress, nontoxic-appearing.  Presents to the emergency department with a chief complaint of syncopal episode and abdominal pain.  Information was obtained from patient and patient's husband at bedside.  Medical records were reviewed including previous provider notes and labs.  Patient's past medical history noted in HPI complicates her care.  The patient is headache after suffering syncopal episode in setting of intracranial hypertension and CSF rhinorrhea will  obtain noncontrast head CT.  Due to patient syncopal episode and abdominal pain episode will obtain EKG, CMP, lipase, CBC and urinalysis.  Due to patient's cough will obtain chest x-ray to evaluate for possible pneumonia.  Chest x-ray was independently reviewed by myself and shows no acute cardiopulmonary abnormality.  Noncontrast head CT was evaluated myself and shows no acute intracranial abnormality.  EKG was evaluated myself and shows sinus rhythm with first-degree AV block.    Lab work was reviewed myself.  Pertinent findings include: -Leukocytosis at 11.5 -CMP, lipase and urinalysis unremarkable.  Noncontrast CT abdomen pelvis was considered due to patient's reports of abdominal cramping however abdomen soft, nondistended, nontender with no guarding or rebound tenderness.  Lab work reassuring.  Low suspicion for cardiac cause of patient's syncopal episode with no preceding chest pain, palpitations, or shortness of breath.    Patient reports feeling better after receiving 1 L fluid bolus.  Patient able to stand and ambulate without difficulty.  No lightheadedness or syncope with change in position or ambulation.  Hemodynamically stable at this time.  Patient would benefit from discharge and follow-up with cardiology due to new onset of first-degree AV block as well as primary care provider due to syncopal episode.  Discussed results, findings,  treatment and follow up. Patient advised of return precautions. Patient verbalized understanding and agreed with plan.   Patient care discussed with attending physician Dr. Sabra Heck.        Final Clinical Impression(s) / ED Diagnoses Final diagnoses:  None    Rx / DC Orders ED Discharge Orders     None         Dyann Ruddle 07/19/21 0153    Noemi Chapel, MD 07/19/21 [phone]

## 2021-07-18 NOTE — ED Notes (Signed)
Ambulated pt to the bathroom and back. Pt stated " felt fine and no dizziness"

## 2021-07-18 NOTE — ED Provider Triage Note (Signed)
Emergency Medicine Provider Triage Evaluation Note  [name] , a 61 y.o. female  was evaluated in triage.  Pt complains of syncopal episode.  She had syncopal episode approximately 6:30 PM while sitting down.  Patient reports that she started feeling nauseous and lightheaded and had syncopal episode.  Family member at bedside reports that single episode lasted approximately "a few minutes."  Patient denies any preceding chest pain, shortness of breath, or sudden onset of headache.  Patient reports that she has had generalized abdominal pain and headache since then.  Patient reports that she has been dealing with a viral illness recently and is concerned for possible dehydration  Review of Systems  Positive: Syncope, generalized abdominal pain, headache Negative: Chest pain, shortness of breath, numbness, weakness, visual service  Physical Exam  BP 140/61 (BP Location: Left Arm)    Pulse 83    Temp 97.6 F (36.4 C) (Oral)    Resp 16    LMP 02/25/2019  Gen:   Awake, no distress   Resp:  Normal effort  MSK:   Moves extremities without difficulty  Other:  Abdomen soft, nondistended, nontender with no guarding or rebound tenderness.  Medical Decision Making  Medically screening exam initiated at 7:46 PM.  Appropriate orders placed.  [name] was informed that the remainder of the evaluation will be completed by another provider, this initial triage assessment does not replace that evaluation, and the importance of remaining in the ED until their evaluation is complete.     Loni Beckwith, [place] 07/18/21 1947

## 2021-07-18 NOTE — ED Triage Notes (Signed)
Syncopal episode, states she fell in her home on floor

## 2021-07-24 DIAGNOSIS — I10 Essential (primary) hypertension: Secondary | ICD-10-CM | POA: Diagnosis not present

## 2021-07-24 DIAGNOSIS — E1142 Type 2 diabetes mellitus with diabetic polyneuropathy: Secondary | ICD-10-CM | POA: Diagnosis not present

## 2021-08-04 ENCOUNTER — Encounter (HOSPITAL_COMMUNITY): Payer: Self-pay | Admitting: Emergency Medicine

## 2021-08-04 ENCOUNTER — Other Ambulatory Visit: Payer: Self-pay

## 2021-08-04 ENCOUNTER — Inpatient Hospital Stay (HOSPITAL_COMMUNITY)
Admission: EM | Admit: 2021-08-04 | Discharge: 2021-08-05 | DRG: 871 | Disposition: A | Discharge status: Home / Self Care | Payer: BC Managed Care – PPO | Attending: Internal Medicine | Admitting: Internal Medicine

## 2021-08-04 ENCOUNTER — Emergency Department (HOSPITAL_COMMUNITY): Payer: BC Managed Care – PPO

## 2021-08-04 DIAGNOSIS — Z23 Encounter for immunization: Secondary | ICD-10-CM

## 2021-08-04 DIAGNOSIS — Z825 Family history of asthma and other chronic lower respiratory diseases: Secondary | ICD-10-CM | POA: Diagnosis not present

## 2021-08-04 DIAGNOSIS — Z20822 Contact with and (suspected) exposure to covid-19: Secondary | ICD-10-CM | POA: Diagnosis present

## 2021-08-04 DIAGNOSIS — R0600 Dyspnea, unspecified: Secondary | ICD-10-CM | POA: Diagnosis not present

## 2021-08-04 DIAGNOSIS — Z79899 Other long term (current) drug therapy: Secondary | ICD-10-CM | POA: Diagnosis not present

## 2021-08-04 DIAGNOSIS — Z7982 Long term (current) use of aspirin: Secondary | ICD-10-CM

## 2021-08-04 DIAGNOSIS — Z8249 Family history of ischemic heart disease and other diseases of the circulatory system: Secondary | ICD-10-CM | POA: Diagnosis not present

## 2021-08-04 DIAGNOSIS — Z6841 Body Mass Index (BMI) 40.0 and over, adult: Secondary | ICD-10-CM

## 2021-08-04 DIAGNOSIS — I10 Essential (primary) hypertension: Secondary | ICD-10-CM | POA: Diagnosis present

## 2021-08-04 DIAGNOSIS — T380X5A Adverse effect of glucocorticoids and synthetic analogues, initial encounter: Secondary | ICD-10-CM | POA: Diagnosis not present

## 2021-08-04 DIAGNOSIS — Z794 Long term (current) use of insulin: Secondary | ICD-10-CM

## 2021-08-04 DIAGNOSIS — E782 Mixed hyperlipidemia: Secondary | ICD-10-CM | POA: Diagnosis not present

## 2021-08-04 DIAGNOSIS — A419 Sepsis, unspecified organism: Secondary | ICD-10-CM | POA: Diagnosis not present

## 2021-08-04 DIAGNOSIS — J45901 Unspecified asthma with (acute) exacerbation: Secondary | ICD-10-CM | POA: Diagnosis present

## 2021-08-04 DIAGNOSIS — Z87891 Personal history of nicotine dependence: Secondary | ICD-10-CM

## 2021-08-04 DIAGNOSIS — R1110 Vomiting, unspecified: Secondary | ICD-10-CM | POA: Diagnosis not present

## 2021-08-04 DIAGNOSIS — Z833 Family history of diabetes mellitus: Secondary | ICD-10-CM | POA: Diagnosis not present

## 2021-08-04 DIAGNOSIS — G932 Benign intracranial hypertension: Secondary | ICD-10-CM

## 2021-08-04 DIAGNOSIS — Z7984 Long term (current) use of oral hypoglycemic drugs: Secondary | ICD-10-CM | POA: Diagnosis not present

## 2021-08-04 DIAGNOSIS — J441 Chronic obstructive pulmonary disease with (acute) exacerbation: Secondary | ICD-10-CM | POA: Diagnosis not present

## 2021-08-04 DIAGNOSIS — J189 Pneumonia, unspecified organism: Principal | ICD-10-CM | POA: Diagnosis present

## 2021-08-04 DIAGNOSIS — E1165 Type 2 diabetes mellitus with hyperglycemia: Secondary | ICD-10-CM | POA: Diagnosis not present

## 2021-08-04 DIAGNOSIS — R000 Tachycardia, unspecified: Secondary | ICD-10-CM | POA: Diagnosis not present

## 2021-08-04 DIAGNOSIS — J449 Chronic obstructive pulmonary disease, unspecified: Secondary | ICD-10-CM

## 2021-08-04 DIAGNOSIS — R197 Diarrhea, unspecified: Secondary | ICD-10-CM | POA: Diagnosis not present

## 2021-08-04 DIAGNOSIS — J9601 Acute respiratory failure with hypoxia: Secondary | ICD-10-CM | POA: Diagnosis present

## 2021-08-04 DIAGNOSIS — J440 Chronic obstructive pulmonary disease with acute lower respiratory infection: Secondary | ICD-10-CM | POA: Diagnosis present

## 2021-08-04 LAB — COMPREHENSIVE METABOLIC PANEL
ALT: 27 U/L (ref 0–44)
AST: 28 U/L (ref 15–41)
Albumin: 3.5 g/dL (ref 3.5–5.0)
Alkaline Phosphatase: 54 U/L (ref 38–126)
Anion gap: 8 (ref 5–15)
BUN: 13 mg/dL (ref 6–20)
CO2: 26 mmol/L (ref 22–32)
Calcium: 9.2 mg/dL (ref 8.9–10.3)
Chloride: 104 mmol/L (ref 98–111)
Creatinine, Ser: 0.82 mg/dL (ref 0.44–1.00)
GFR, Estimated: >60 mL/min (ref >60)
Glucose, Bld: 258 mg/dL — ABNORMAL HIGH (ref 70–99)
Potassium: 4.4 mmol/L (ref 3.5–5.1)
Sodium: 138 mmol/L (ref 135–145)
Total Bilirubin: 0.3 mg/dL (ref 0.3–1.2)
Total Protein: 7.5 g/dL (ref 6.5–8.1)

## 2021-08-04 LAB — CBC WITH DIFFERENTIAL/PLATELET
Abs Immature Granulocytes: 0.01 10*3/uL (ref 0.00–0.07)
Basophils Absolute: 0 10*3/uL (ref 0.0–0.1)
Basophils Relative: 0 %
Eosinophils Absolute: 0 10*3/uL (ref 0.0–0.5)
Eosinophils Relative: 1 %
HCT: 42.2 % (ref 36.0–46.0)
Hemoglobin: 13.8 g/dL (ref 12.0–15.0)
Immature Granulocytes: 0 %
Lymphocytes Relative: 27 %
Lymphs Abs: 1 10*3/uL (ref 0.7–4.0)
MCH: 27.2 pg (ref 26.0–34.0)
MCHC: 32.7 g/dL (ref 30.0–36.0)
MCV: 83.2 fL (ref 80.0–100.0)
Monocytes Absolute: 0.4 10*3/uL (ref 0.1–1.0)
Monocytes Relative: 11 %
Neutro Abs: 2.3 10*3/uL (ref 1.7–7.7)
Neutrophils Relative %: 61 %
Platelets: 186 10*3/uL (ref 150–400)
RBC: 5.07 MIL/uL (ref 3.87–5.11)
RDW: 14.6 % (ref 11.5–15.5)
WBC: 3.8 10*3/uL — ABNORMAL LOW (ref 4.0–10.5)
nRBC: 0 % (ref 0.0–0.2)

## 2021-08-04 LAB — RESP PANEL BY RT-PCR (FLU A&B, COVID) ARPGX2
Influenza A by PCR: NEGATIVE
Influenza B by PCR: NEGATIVE
SARS Coronavirus 2 by RT PCR: NEGATIVE

## 2021-08-04 LAB — LIPASE, BLOOD: Lipase: 27 U/L (ref 11–51)

## 2021-08-04 LAB — GLUCOSE, CAPILLARY: Glucose-Capillary: 377 mg/dL — ABNORMAL HIGH (ref 70–99)

## 2021-08-04 MED ORDER — ENOXAPARIN SODIUM 60 MG/0.6ML IJ SOSY
50.0000 mg | PREFILLED_SYRINGE | INTRAMUSCULAR | Status: DC
  Administered 2021-08-04: 50 mg via SUBCUTANEOUS
  Filled 2021-08-04: qty 0.6

## 2021-08-04 MED ORDER — METHYLPREDNISOLONE SODIUM SUCC 40 MG IJ SOLR
40.0000 mg | Freq: Two times a day (BID) | INTRAMUSCULAR | Status: DC
  Administered 2021-08-05: 40 mg via INTRAVENOUS
  Filled 2021-08-04: qty 1

## 2021-08-04 MED ORDER — METHYLPREDNISOLONE SODIUM SUCC 125 MG IJ SOLR
125.0000 mg | Freq: Once | INTRAMUSCULAR | Status: AC
Start: 2021-08-04 — End: 2021-08-04
  Administered 2021-08-04: 125 mg via INTRAVENOUS
  Filled 2021-08-04: qty 2

## 2021-08-04 MED ORDER — IPRATROPIUM-ALBUTEROL 0.5-2.5 (3) MG/3ML IN SOLN
3.0000 mL | Freq: Once | RESPIRATORY_TRACT | Status: AC
  Administered 2021-08-04: 3 mL via RESPIRATORY_TRACT
  Filled 2021-08-04: qty 3

## 2021-08-04 MED ORDER — ONDANSETRON HCL 4 MG/2ML IJ SOLN
4.0000 mg | Freq: Four times a day (QID) | INTRAMUSCULAR | Status: DC | PRN
Start: 2021-08-04 — End: 2021-08-05

## 2021-08-04 MED ORDER — INSULIN ASPART 100 UNIT/ML IJ SOLN: 4.0000 [IU] | Freq: Three times a day (TID) | INTRAMUSCULAR | Status: DC

## 2021-08-04 MED ORDER — HYDROCHLOROTHIAZIDE 25 MG PO TABS
25.0000 mg | ORAL_TABLET | Freq: Every day | ORAL | Status: DC
  Administered 2021-08-05: 25 mg via ORAL
  Filled 2021-08-04: qty 1

## 2021-08-04 MED ORDER — SODIUM CHLORIDE 0.9 % IV SOLN
500.0000 mg | INTRAVENOUS | Status: DC
  Administered 2021-08-04: 500 mg via INTRAVENOUS
  Filled 2021-08-04: qty 5

## 2021-08-04 MED ORDER — ACETAMINOPHEN 325 MG PO TABS: 650.0000 mg | ORAL_TABLET | Freq: Four times a day (QID) | ORAL | Status: DC | PRN

## 2021-08-04 MED ORDER — ONDANSETRON HCL 4 MG/2ML IJ SOLN
4.0000 mg | Freq: Once | INTRAMUSCULAR | Status: AC
  Administered 2021-08-04: 4 mg via INTRAVENOUS
  Filled 2021-08-04: qty 2

## 2021-08-04 MED ORDER — ALBUTEROL SULFATE (2.5 MG/3ML) 0.083% IN NEBU
5.0000 mg | INHALATION_SOLUTION | RESPIRATORY_TRACT | Status: DC | PRN
  Administered 2021-08-04: 5 mg via RESPIRATORY_TRACT
  Filled 2021-08-04: qty 6

## 2021-08-04 MED ORDER — IPRATROPIUM-ALBUTEROL 0.5-2.5 (3) MG/3ML IN SOLN
3.0000 mL | RESPIRATORY_TRACT | Status: DC | PRN
Start: 2021-08-04 — End: 2021-08-05

## 2021-08-04 MED ORDER — INSULIN GLARGINE-YFGN 100 UNIT/ML ~~LOC~~ SOLN
10.0000 [IU] | Freq: Every day | SUBCUTANEOUS | Status: DC
  Administered 2021-08-04: 10 [IU] via SUBCUTANEOUS
  Filled 2021-08-04: qty 0.1

## 2021-08-04 MED ORDER — IPRATROPIUM-ALBUTEROL 0.5-2.5 (3) MG/3ML IN SOLN
3.0000 mL | Freq: Four times a day (QID) | RESPIRATORY_TRACT | Status: DC
  Administered 2021-08-05 (×2): 3 mL via RESPIRATORY_TRACT
  Filled 2021-08-04 (×3): qty 3

## 2021-08-04 MED ORDER — INSULIN ASPART 100 UNIT/ML IJ SOLN
0.0000 [IU] | Freq: Every day | INTRAMUSCULAR | Status: DC
  Administered 2021-08-04: 5 [IU] via SUBCUTANEOUS

## 2021-08-04 MED ORDER — INSULIN ASPART 100 UNIT/ML IJ SOLN: 0.0000 [IU] | Freq: Three times a day (TID) | INTRAMUSCULAR | Status: DC

## 2021-08-04 MED ORDER — SODIUM CHLORIDE 0.9 % IV BOLUS (SEPSIS)
500.0000 mL | Freq: Once | INTRAVENOUS | Status: AC
  Administered 2021-08-04: 500 mL via INTRAVENOUS

## 2021-08-04 MED ORDER — PREDNISONE 20 MG PO TABS: 40.0000 mg | ORAL_TABLET | Freq: Every day | ORAL | Status: DC

## 2021-08-04 MED ORDER — AMLODIPINE BESYLATE 5 MG PO TABS
5.0000 mg | ORAL_TABLET | Freq: Every day | ORAL | Status: DC
  Administered 2021-08-05: 5 mg via ORAL
  Filled 2021-08-04: qty 1

## 2021-08-04 MED ORDER — IPRATROPIUM-ALBUTEROL 0.5-2.5 (3) MG/3ML IN SOLN
3.0000 mL | Freq: Four times a day (QID) | RESPIRATORY_TRACT | Status: DC
  Administered 2021-08-04: 3 mL via RESPIRATORY_TRACT
  Filled 2021-08-04: qty 3

## 2021-08-04 MED ORDER — METHYLPREDNISOLONE SODIUM SUCC 40 MG IJ SOLR
40.0000 mg | Freq: Two times a day (BID) | INTRAMUSCULAR | Status: DC
  Administered 2021-08-04: 40 mg via INTRAVENOUS
  Filled 2021-08-04 (×2): qty 1

## 2021-08-04 MED ORDER — ONDANSETRON HCL 4 MG PO TABS: 4.0000 mg | ORAL_TABLET | Freq: Four times a day (QID) | ORAL | Status: DC | PRN

## 2021-08-04 MED ORDER — PNEUMOCOCCAL VAC POLYVALENT 25 MCG/0.5ML IJ INJ
0.5000 mL | INJECTION | INTRAMUSCULAR | Status: AC
  Administered 2021-08-05: 0.5 mL via INTRAMUSCULAR
  Filled 2021-08-04: qty 0.5

## 2021-08-04 MED ORDER — PANTOPRAZOLE SODIUM 40 MG PO TBEC
40.0000 mg | DELAYED_RELEASE_TABLET | Freq: Every day | ORAL | Status: DC
  Administered 2021-08-04 – 2021-08-05 (×2): 40 mg via ORAL
  Filled 2021-08-04 (×2): qty 1

## 2021-08-04 MED ORDER — SODIUM CHLORIDE 0.9 % IV SOLN
2.0000 g | INTRAVENOUS | Status: DC
  Administered 2021-08-04: 2 g via INTRAVENOUS
  Filled 2021-08-04: qty 20

## 2021-08-04 MED ORDER — LOSARTAN POTASSIUM 50 MG PO TABS
100.0000 mg | ORAL_TABLET | Freq: Every day | ORAL | Status: DC
Start: 2021-08-05 — End: 2021-08-05
  Administered 2021-08-05: 100 mg via ORAL
  Filled 2021-08-04: qty 2

## 2021-08-04 MED ORDER — DM-GUAIFENESIN ER 30-600 MG PO TB12
1.0000 | ORAL_TABLET | Freq: Two times a day (BID) | ORAL | Status: DC
  Administered 2021-08-04: 1 via ORAL
  Filled 2021-08-04 (×2): qty 1

## 2021-08-04 MED ORDER — SODIUM CHLORIDE 0.9 % IV SOLN
1000.0000 mL | INTRAVENOUS | Status: DC
  Administered 2021-08-04 – 2021-08-05 (×2): 1000 mL via INTRAVENOUS

## 2021-08-04 MED ORDER — AZITHROMYCIN 250 MG PO TABS
500.0000 mg | ORAL_TABLET | Freq: Once | ORAL | Status: AC
  Administered 2021-08-04: 500 mg via ORAL
  Filled 2021-08-04: qty 2

## 2021-08-04 MED ORDER — LOSARTAN POTASSIUM-HCTZ 100-25 MG PO TABS: 1.0000 | ORAL_TABLET | Freq: Every day | ORAL | Status: DC

## 2021-08-04 MED ORDER — ROSUVASTATIN CALCIUM 10 MG PO TABS
10.0000 mg | ORAL_TABLET | Freq: Every day | ORAL | Status: DC
  Administered 2021-08-05: 10 mg via ORAL
  Filled 2021-08-04: qty 1

## 2021-08-04 MED ORDER — ACETAMINOPHEN 650 MG RE SUPP: 650.0000 mg | Freq: Four times a day (QID) | RECTAL | Status: DC | PRN

## 2021-08-04 MED ORDER — SODIUM CHLORIDE 0.9 % IV SOLN
1.0000 g | Freq: Once | INTRAVENOUS | Status: AC
  Administered 2021-08-04: 1 g via INTRAVENOUS
  Filled 2021-08-04: qty 10

## 2021-08-04 NOTE — Assessment & Plan Note (Signed)
BMI 41.95, diet and lifestyle modification Patient will need outpatient follow-up with PCP for weight loss program

## 2021-08-04 NOTE — Assessment & Plan Note (Signed)
Continue Crestor 

## 2021-08-04 NOTE — ED Triage Notes (Addendum)
Patient c/o shortness of breath that started yesterday and has progressively gotten worse today. Per patient worse with exacerbation. Patient has hx of asthma and COPD in which she uses inhaler and neb treatments. Per patient last used inhaler at 1300 today and nebulizer at 1500 today with no improvement. Patient noted to have temp of 100.4. Per patient some vomiting and diarrhea. (Vomited x 2 yesterday and diarrhea x10 Friday and yesterday neither today). Patient does report sharp pain on left side of chest pain that started earlier. Patient denies any current cardiac diagnosis but is supposed to follow-up with cardiologist. March 6th. Denies any swelling.

## 2021-08-04 NOTE — Assessment & Plan Note (Signed)
This subsided since yesterday Continue Zofran as needed

## 2021-08-04 NOTE — Assessment & Plan Note (Signed)
Patient met sepsis criteria due to the being febrile, tachycardic, tachypneic and leukopenic Chest x-ray was suggestive of multifocal pneumonia Patient was started on ceftriaxone and azithromycin, we shall continue same at this time with plan to de-escalate/discontinue based on blood culture, sputum culture, urine Legionella, strep pneumo and procalcitonin Continue Tylenol as needed Continue Mucinex, incentive spirometry, flutter valve

## 2021-08-04 NOTE — Assessment & Plan Note (Signed)
Continue supplemental oxygen via [place] to maintain O2 sats > 92% with plan to wean patient off supplemental oxygen as tolerated (patient does not use oxygen at baseline) Continue treatment as well for pneumonia and COPD

## 2021-08-04 NOTE — Assessment & Plan Note (Signed)
Hemoglobin A1c about 2 months ago was 13.5 Continue ISS and hypoglycemic protocol Continue Semglee 10 units nightly and adjust dose accordingly

## 2021-08-04 NOTE — Assessment & Plan Note (Signed)
Continue duo nebs, Mucinex, Solu-Medrol, azithromycin. Continue Protonix to prevent steroid-induced ulcer Continue incentive spirometry and flutter valve Continue supplemental oxygen to maintain O2 sat > 92% with plan to wean patient off oxygen as tolerated

## 2021-08-04 NOTE — Assessment & Plan Note (Signed)
Diarrhea already resolved since yesterday

## 2021-08-04 NOTE — Assessment & Plan Note (Signed)
Stable.  Patient follows with ([facility])

## 2021-08-04 NOTE — H&P (Signed)
History and Physical    Patient: [name] HFG:[id number] DOB: 01-20-1961 DOA: 08/04/2021 DOS: the patient was seen and examined on 08/04/2021 PCP: Carrolyn Meiers, MD  Patient coming from: Home  Chief Complaint:  Chief Complaint  Patient presents with   Shortness of Breath    HPI: [name] is a 61 y.o. female with medical history significant of COPD/asthma, T2DM, idiopathic intracranial hypertension and CSF leak (follows with neurosurgery at [facility]) who complains of diarrhea and vomiting which started 2 days ago and subsided yesterday.  She also complained of productive cough of thick greenish phlegm and onset of shortness of breath which worsens with ambulation, this was associated with increased weakness.  Breathing treatment at home provided no relief, she developed fever at home today, so she decided to go to the ED for further evaluation and management.  She denies headache, blurry vision, palpitations, abdominal pain, numbness or tingling  ED course: In the emergency department, patient was febrile with a temperature of 100.4, she was tachypneic and tachycardic, BP was 155/77, O2 sat was 89% on room air.  Work-up in the ED showed normal CBC except leukopenia, BMP was normal except for hyperglycemia.  Lipase was 27.  Influenza A, B, SARS coronavirus 2 was negative. Chest x-ray was suggestive of multifocal patchy interstitial and airspace opacities bilaterally, more pronounced within the periphery of the lungs.  Findings are concerning for multifocal atypical/viral pneumonia (clinic COVID-19). Patient was treated with IV ceftriaxone and azithromycin, breathing treatment was provided, IV hydration was started.  Hospitalist was asked to admit patient for further evaluation and management.  Review of Systems: As mentioned in the history of present illness. All other systems reviewed and are negative. Past Medical History:  Diagnosis Date   Allergy    Asthma     COPD (chronic obstructive pulmonary disease) ([place])    Diabetes mellitus without complication ([place])    Hypertension    Morbid obesity due to excess calories ([place])    Umbilical hernia    Past Surgical History:  Procedure Laterality Date   CARPAL TUNNEL RELEASE Right    CHOLECYSTECTOMY     COLONOSCOPY N/A 01/12/2015   Procedure: COLONOSCOPY;  Surgeon: Danie Binder, MD;  Location: [facility] ENDO SUITE;  Service: Endoscopy;  Laterality: N/A;  0830   HYSTEROSCOPY WITH D & C N/A 04/01/2013   Procedure: DILATATION AND CURETTAGE /HYSTEROSCOPY;  Surgeon: Florian Buff, MD;  Location: [facility] ORS;  Service: Gynecology;  Laterality: N/A;   NASAL POLYP SURGERY     Social History:  reports that she quit smoking about 24 years ago. Her smoking use included cigarettes. She has a 10.00 pack-year smoking history. She has never used smokeless tobacco. She reports current alcohol use. She reports that she does not use drugs.  No Known Allergies  Family History  Problem Relation Age of Onset   Diabetes Mother    Kidney disease Mother    Other Mother        renal failure   Hypertension Mother    Cancer Mother        ?uterine cancer    Diabetes Father    Cancer Father        pancreatic   Diabetes Sister    Kidney disease Sister    Diabetes Maternal Grandmother    Asthma Maternal Grandmother    COPD Maternal Grandmother        bronchitis   Hypertension Maternal Grandmother    Thyroid disease  Maternal Grandmother    Hypertension Daughter    Colon cancer Neg Hx     Prior to Admission medications   Medication Sig Start Date End Date Taking? Authorizing Provider  acetaZOLAMIDE (DIAMOX) 250 MG tablet Take 250 mg by mouth 2 (two) times daily. 06/06/21  Yes [provider]  amLODipine (NORVASC) 5 MG tablet Take 1 tablet by mouth daily. 10/09/20  Yes [provider]  aspirin 81 MG EC tablet Take 1 tablet by mouth daily.   Yes [provider]  calcium carbonate (OS-CAL - DOSED IN MG  OF ELEMENTAL CALCIUM) 1250 (500 Ca) MG tablet Take 1 tablet by mouth.   Yes [provider]  diclofenac sodium (VOLTAREN) 1 % GEL 4 (four) times daily as needed. 04/03/19  Yes [provider]  fluticasone (FLONASE) 50 MCG/ACT nasal spray Place 2 sprays into both nostrils daily. 05/21/21  Yes [provider]  Fluticasone-Salmeterol (ADVAIR) 250-50 MCG/DOSE AEPB Inhale 1 puff into the lungs 2 (two) times daily.    Yes [provider]  gabapentin (NEURONTIN) 100 MG capsule Take 1 capsule by mouth 2 (two) times daily. 11/21/20  Yes [provider]  glipiZIDE (GLUCOTROL XL) 5 MG 24 hr tablet TAKE 1 TABLET BY MOUTH EVERY DAY WITH BREAKFAST 03/13/21  Yes Nida, Marella Chimes, MD  Insulin Glargine (TOUJEO MAX SOLOSTAR [place]) Inject 100 Units into the skin at bedtime.   Yes [provider]  ipratropium (ATROVENT) 0.06 % nasal spray Place 2 sprays into both nostrils 2 (two) times daily. 07/11/21  Yes [provider]  Ipratropium-Albuterol (COMBIVENT RESPIMAT IN) Inhale 1 puff into the lungs daily as needed (shortness of breath).   Yes [provider]  ipratropium-albuterol (DUONEB) 0.5-2.5 (3) MG/3ML SOLN Take 3 mLs by nebulization every 4 (four) hours as needed (Shortness of Breath).    Yes [provider]  losartan-hydrochlorothiazide (HYZAAR) 100-25 MG tablet Take 1 tablet by mouth daily. 10/10/20  Yes [provider]  metFORMIN (GLUCOPHAGE) 500 MG tablet Take 1 tablet (500 mg total) by mouth 2 (two) times daily with a meal. 07/12/21  Yes Nida, Marella Chimes, MD  Multiple Vitamin (MULTIVITAMIN WITH MINERALS) TABS tablet Take 1 tablet by mouth daily.   Yes [provider]  rosuvastatin (CRESTOR) 20 MG tablet Take 0.5 tablets (10 mg total) by mouth daily. 06/03/21  Yes Johnson, Clanford L, MD  traMADol (ULTRAM) 50 MG tablet Take 50 mg by mouth 2 (two) times daily. 08/04/15  Yes [provider]  B-D UF III MINI  PEN NEEDLES 31G X 5 MM MISC USE ONCE DAILY 10/26/20   Cassandria Anger, MD    Physical Exam: Vitals:   08/04/21 1830 08/04/21 1900 08/04/21 2121 08/04/21 2132  BP: (!) 155/65 139/60  132/69  Pulse: (!) 107 98  (!) 108  Resp: (!) 25 14    Temp:    99.1 F (37.3 C)  TempSrc:    Oral  SpO2: 91% 100% 97% 97%  Weight:      Height:       General: Patient was awake and alert and oriented x3. Not in any acute distress.  HEENT: NCAT.  PERRLA. EOMI. Sclerae anicteric.  Moist mucosal membranes. Neck: Neck supple without lymphadenopathy. No carotid bruits. No masses palpated.  Cardiovascular: Tachycardia.  Regular rate with normal S1-S2 sounds. No murmurs, rubs or gallops auscultated. No JVD.  Respiratory: Tachypnea.  Clear breath sounds.  No accessory muscle use. Abdomen: Soft, nontender, nondistended. Active bowel sounds.  No masses or hepatosplenomegaly  Skin: No rashes, lesions, or ulcerations.  Dry, warm to touch. Musculoskeletal:  2+ dorsalis pedis and radial pulses. Good ROM.  No contractures  Psychiatric: Intact judgment and insight.  Mood appropriate to current condition. Neurologic: No focal neurological deficits. Strength is 5/5 x 4.  CN II - XII grossly intact.   Data Reviewed: EKG personally reviewed showed sinus tachycardia at a rate of 107 bpm  Assessment and Plan: * Multifocal pneumonia- (present on admission) Patient met sepsis criteria due to the being febrile, tachycardic, tachypneic and leukopenic Chest x-ray was suggestive of multifocal pneumonia Patient was started on ceftriaxone and azithromycin, we shall continue same at this time with plan to de-escalate/discontinue based on blood culture, sputum culture, urine Legionella, strep pneumo and procalcitonin Continue Tylenol as needed Continue Mucinex, incentive spirometry, flutter valve      COPD with acute exacerbation (HCC) Continue duo nebs, Mucinex, Solu-Medrol, azithromycin. Continue Protonix to prevent  steroid-induced ulcer Continue incentive spirometry and flutter valve Continue supplemental oxygen to maintain O2 sat > 92% with plan to wean patient off oxygen as tolerated   Sepsis ([place]) Patient met sepsis criteria due to the being febrile, tachycardic, tachypneic and leukopenic Continue treatment as described for multifocal pneumonia  Acute respiratory failure with hypoxia (HCC) Continue supplemental oxygen via [place] to maintain O2 sats > 92% with plan to wean patient off supplemental oxygen as tolerated (patient does not use oxygen at baseline) Continue treatment as well for pneumonia and COPD  Vomiting This subsided since yesterday Continue Zofran as needed  Diarrhea Diarrhea already resolved since yesterday  Idiopathic intracranial hypertension Stable.  Patient follows with ([facility] [facility] neurosurgery)  Mixed hyperlipidemia- (present on admission) Continue Crestor  Uncontrolled type 2 diabetes mellitus with hyperglycemia ([place])- (present on admission) Hemoglobin A1c about 2 months ago was 13.5 Continue ISS and hypoglycemic protocol Continue Semglee 10 units nightly and adjust dose accordingly  Morbid obesity ([place])- (present on admission) BMI 41.95, diet and lifestyle modification Patient will need outpatient follow-up with PCP for weight loss program   Essential hypertension, benign- (present on admission) Continue amlodipine, Hyzaar   Advance Care Planning:   Code Status: Full Code   Consults: None  Family Communication: None at bedside  Severity of Illness: The appropriate patient status for this patient is INPATIENT. Inpatient status is judged to be reasonable and necessary in order to provide the required intensity of service to ensure the patient's safety. The patient's presenting symptoms, physical exam findings, and initial radiographic and laboratory data in the context of their chronic comorbidities is felt to place them at high risk for further  clinical deterioration. Furthermore, it is not anticipated that the patient will be medically stable for discharge from the hospital within 2 midnights of admission.   * I certify that at the point of admission it is my clinical judgment that the patient will require inpatient hospital care spanning beyond 2 midnights from the point of admission due to high intensity of service, high risk for further deterioration and high frequency of surveillance required.*  Author: Bernadette Hoit, DO 08/04/2021 11:24 PM  For on call review [email or web address].

## 2021-08-04 NOTE — Assessment & Plan Note (Signed)
Patient met sepsis criteria due to the being febrile, tachycardic, tachypneic and leukopenic Continue treatment as described for multifocal pneumonia

## 2021-08-04 NOTE — Assessment & Plan Note (Signed)
Continue amlodipine, Hyzaar

## 2021-08-04 NOTE — ED Provider Notes (Addendum)
[facility] EMERGENCY DEPARTMENT Provider Note   CSN: [id number] Arrival date & time: 08/04/21  1645     History  Chief Complaint  Patient presents with   Shortness of Breath    [name] is a 61 y.o. female.   Shortness of Breath Associated symptoms: fever    Patient has history of hypertension, COPD, diabetes as well as idiopathic intracranial hypertension and a CSF leak.  Patient is followed by neurosurgery at [facility].  Patient states that she has some chronic nasal drainage associated with her CSF leak.  Patient feels like this causes some persistent issues with nausea and vomiting for her.  Patient states over the last several days she has had some issues with vomiting and diarrhea although has not had any episodes today.  She also has been coughing a lot more than usual and has been feeling short of breath.  She has been trying her breathing treatments without relief.  Patient did develop some low-grade fevers today.    Home Medications Prior to Admission medications   Medication Sig Start Date End Date Taking? Authorizing Provider  acetaZOLAMIDE (DIAMOX) 250 MG tablet Take 250 mg by mouth 2 (two) times daily. 06/06/21  Yes [provider]  amLODipine (NORVASC) 5 MG tablet Take 1 tablet by mouth daily. 10/09/20  Yes [provider]  aspirin 81 MG EC tablet Take 1 tablet by mouth daily.   Yes [provider]  calcium carbonate (OS-CAL - DOSED IN MG OF ELEMENTAL CALCIUM) 1250 (500 Ca) MG tablet Take 1 tablet by mouth.   Yes [provider]  diclofenac sodium (VOLTAREN) 1 % GEL 4 (four) times daily as needed. 04/03/19  Yes [provider]  fluticasone (FLONASE) 50 MCG/ACT nasal spray Place 2 sprays into both nostrils daily. 05/21/21  Yes [provider]  Fluticasone-Salmeterol (ADVAIR) 250-50 MCG/DOSE AEPB Inhale 1 puff into the lungs 2 (two) times daily.    Yes [provider]  gabapentin (NEURONTIN)  100 MG capsule Take 1 capsule by mouth 2 (two) times daily. 11/21/20  Yes [provider]  glipiZIDE (GLUCOTROL XL) 5 MG 24 hr tablet TAKE 1 TABLET BY MOUTH EVERY DAY WITH BREAKFAST 03/13/21  Yes Nida, Marella Chimes, MD  Insulin Glargine (TOUJEO MAX SOLOSTAR [place]) Inject 100 Units into the skin at bedtime.   Yes [provider]  ipratropium (ATROVENT) 0.06 % nasal spray Place 2 sprays into both nostrils 2 (two) times daily. 07/11/21  Yes [provider]  Ipratropium-Albuterol (COMBIVENT RESPIMAT IN) Inhale 1 puff into the lungs daily as needed (shortness of breath).   Yes [provider]  ipratropium-albuterol (DUONEB) 0.5-2.5 (3) MG/3ML SOLN Take 3 mLs by nebulization every 4 (four) hours as needed (Shortness of Breath).    Yes [provider]  losartan-hydrochlorothiazide (HYZAAR) 100-25 MG tablet Take 1 tablet by mouth daily. 10/10/20  Yes [provider]  metFORMIN (GLUCOPHAGE) 500 MG tablet Take 1 tablet (500 mg total) by mouth 2 (two) times daily with a meal. 07/12/21  Yes Nida, Marella Chimes, MD  Multiple Vitamin (MULTIVITAMIN WITH MINERALS) TABS tablet Take 1 tablet by mouth daily.   Yes [provider]  rosuvastatin (CRESTOR) 20 MG tablet Take 0.5 tablets (10 mg total) by mouth daily. 06/03/21  Yes Johnson, Clanford L, MD  traMADol (ULTRAM) 50 MG tablet Take 50 mg by mouth 2 (two) times daily. 08/04/15  Yes [provider]  B-D UF III MINI PEN NEEDLES 31G X 5  MM MISC USE ONCE DAILY 10/26/20   Cassandria Anger, MD      Allergies    Patient has no known allergies.    Review of Systems   Review of Systems  Constitutional:  Positive for fever.  Respiratory:  Positive for shortness of breath.    Physical Exam Updated Vital Signs BP 139/60    Pulse 98    Temp (S) (!) 100.4 F (38 C) (Oral)    Resp 14    Ht 1.549 m (5\' 1" )    Wt 100.7 kg    LMP 02/25/2019    SpO2 100%    BMI 41.95 kg/m  Physical Exam Vitals and nursing  note reviewed.  Constitutional:      Appearance: She is well-developed. She is not diaphoretic.  HENT:     Head: Normocephalic and atraumatic.     Right Ear: External ear normal.     Left Ear: External ear normal.  Eyes:     General: No scleral icterus.       Right eye: No discharge.        Left eye: No discharge.     Conjunctiva/sclera: Conjunctivae normal.  Neck:     Trachea: No tracheal deviation.  Cardiovascular:     Rate and Rhythm: Normal rate and regular rhythm.  Pulmonary:     Effort: Pulmonary effort is normal. No respiratory distress.     Breath sounds: No stridor. Decreased breath sounds and wheezing present. No rales.  Abdominal:     General: Bowel sounds are normal. There is no distension.     Palpations: Abdomen is soft.     Tenderness: There is no abdominal tenderness. There is no guarding or rebound.  Musculoskeletal:        General: No tenderness or deformity.     Cervical back: Neck supple.  Skin:    General: Skin is warm and dry.     Findings: No rash.  Neurological:     General: No focal deficit present.     Mental Status: She is alert.     Cranial Nerves: No cranial nerve deficit (no facial droop, extraocular movements intact, no slurred speech).     Sensory: No sensory deficit.     Motor: No abnormal muscle tone or seizure activity.     Coordination: Coordination normal.  Psychiatric:        Mood and Affect: Mood normal.    ED Results / Procedures / Treatments   Labs (all labs ordered are listed, but only abnormal results are displayed) Labs Reviewed  CBC WITH DIFFERENTIAL/PLATELET - Abnormal; Notable for the following components:      Result Value   WBC 3.8 (*)    All other components within normal limits  COMPREHENSIVE METABOLIC PANEL - Abnormal; Notable for the following components:   Glucose, Bld 258 (*)    All other components within normal limits  RESP PANEL BY RT-PCR (FLU A&B, COVID) ARPGX2  LIPASE, BLOOD    EKG EKG  Interpretation  Date/Time:  Sunday August 04 2021 16:57:42 EST Ventricular Rate:  107 PR Interval:  191 QRS Duration: 88 QT Interval:  342 QTC Calculation: 457 R Axis:   32 Text Interpretation: Sinus tachycardia Probable anteroseptal infarct, old Since last tracing rate faster Confirmed by Dorie Rank [phone]) on 08/04/2021 5:23:15 PM  Radiology DG Chest Port 1 View  Result Date: 08/04/2021 CLINICAL DATA:  Dyspnea EXAM: PORTABLE CHEST 1 VIEW COMPARISON:  07/18/2021 FINDINGS: Normal heart size. Aortic atherosclerosis.  Interval development of multifocal patchy interstitial and airspace opacities bilaterally, more pronounced within the periphery of the lungs. No pleural effusion or pneumothorax. IMPRESSION: Multifocal patchy interstitial and airspace opacities bilaterally, more pronounced within the periphery of the lungs. Findings are concerning for multifocal atypical/viral pneumonia (including COVID-19). Electronically Signed   By: Davina Poke D.O.   On: 08/04/2021 17:38    Procedures Procedures    Medications Ordered in ED Medications  sodium chloride 0.9 % bolus 500 mL (0 mLs Intravenous Stopped 08/04/21 1900)    Followed by  0.9 %  sodium chloride infusion (1,000 mLs Intravenous New Bag/Given 08/04/21 1747)  albuterol (PROVENTIL) (2.5 MG/3ML) 0.083% nebulizer solution 5 mg (5 mg Nebulization Given 08/04/21 1853)  ondansetron (ZOFRAN) injection 4 mg (4 mg Intravenous Given 08/04/21 1739)  ipratropium-albuterol (DUONEB) 0.5-2.5 (3) MG/3ML nebulizer solution 3 mL (3 mLs Nebulization Given 08/04/21 1854)  methylPREDNISolone sodium succinate (SOLU-MEDROL) 125 mg/2 mL injection 125 mg (125 mg Intravenous Given 08/04/21 1738)  cefTRIAXone (ROCEPHIN) 1 g in sodium chloride 0.9 % 100 mL IVPB (1 g Intravenous New Bag/Given 08/04/21 1856)  azithromycin (ZITHROMAX) tablet 500 mg (500 mg Oral Given 08/04/21 1854)    ED Course/ Medical Decision Making/ A&P Clinical Course as of 08/04/21 2050  Sun  Aug 04, 2021  1837 Resp Panel by RT-PCR (Flu A&B, Covid) Nasopharyngeal Swab COVID and flu are negative [JK]  1837 CBC WITH DIFFERENTIAL(!) CBC normal except for white count slightly decreased [JK]  1837 Comprehensive metabolic panel(!) Hyperglycemia without signs of acidosis [JK]  1837 DG Chest Port 1 View Chest x-ray images and radiology report reviewed.  Multifocal pneumonia noted [JK]  2011 Patient still feels short of breath.  Oxygen saturation low 90s on room air at rest [JK]  2012 Case discussed with Dr. Josephine Cables regarding admission [JK]    Clinical Course User Index [JK] Dorie Rank, MD                           Medical Decision Making Amount and/or Complexity of Data Reviewed Labs: ordered. Decision-making details documented in ED Course. Radiology: ordered. Decision-making details documented in ED Course.  Risk Prescription drug management. Decision regarding hospitalization.   Patient presented to the ED for shortness of breath.  Patient had notable wheezing on exam.  Her oxygen saturations were low.  Patient also was noted to have a fever.  Patient was given breathing treatment steroids but continues to have some wheezing and borderline oxygen saturations.  Chest x-ray also shows multifocal pneumonia.  Reassessed patient and she still feels like she has to increased work of breathing.  Does not feel like she is safe for discharge.  Considering her multifocal pneumonia and borderline oxygen saturation I will consult with the medical service for admission and further treatment        Final Clinical Impression(s) / ED Diagnoses Final diagnoses:  Multifocal pneumonia  Asthma with acute exacerbation, unspecified asthma severity, unspecified whether persistent     Dorie Rank, MD 08/04/21 2013    Dorie Rank, MD 08/04/21 2050

## 2021-08-05 ENCOUNTER — Encounter: Payer: Self-pay | Admitting: "Endocrinology

## 2021-08-05 ENCOUNTER — Encounter (HOSPITAL_COMMUNITY): Payer: Self-pay | Admitting: Internal Medicine

## 2021-08-05 ENCOUNTER — Ambulatory Visit (INDEPENDENT_AMBULATORY_CARE_PROVIDER_SITE_OTHER): Payer: BC Managed Care – PPO | Admitting: "Endocrinology

## 2021-08-05 VITALS — BP 149/84 | HR 94 | Ht 61.0 in | Wt 220.2 lb

## 2021-08-05 DIAGNOSIS — I10 Essential (primary) hypertension: Secondary | ICD-10-CM | POA: Diagnosis not present

## 2021-08-05 DIAGNOSIS — E1165 Type 2 diabetes mellitus with hyperglycemia: Secondary | ICD-10-CM | POA: Diagnosis not present

## 2021-08-05 DIAGNOSIS — E782 Mixed hyperlipidemia: Secondary | ICD-10-CM | POA: Diagnosis not present

## 2021-08-05 DIAGNOSIS — J189 Pneumonia, unspecified organism: Secondary | ICD-10-CM | POA: Diagnosis not present

## 2021-08-05 LAB — CBC
HCT: 38.3 % (ref 36.0–46.0)
Hemoglobin: 12.2 g/dL (ref 12.0–15.0)
MCH: 26.5 pg (ref 26.0–34.0)
MCHC: 31.9 g/dL (ref 30.0–36.0)
MCV: 83.3 fL (ref 80.0–100.0)
Platelets: 194 10*3/uL (ref 150–400)
RBC: 4.6 MIL/uL (ref 3.87–5.11)
RDW: 14.4 % (ref 11.5–15.5)
WBC: 3.2 10*3/uL — ABNORMAL LOW (ref 4.0–10.5)
nRBC: 0 % (ref 0.0–0.2)

## 2021-08-05 LAB — COMPREHENSIVE METABOLIC PANEL
ALT: 26 U/L (ref 0–44)
AST: 23 U/L (ref 15–41)
Albumin: 3.2 g/dL — ABNORMAL LOW (ref 3.5–5.0)
Alkaline Phosphatase: 50 U/L (ref 38–126)
Anion gap: 10 (ref 5–15)
BUN: 13 mg/dL (ref 6–20)
CO2: 23 mmol/L (ref 22–32)
Calcium: 8.9 mg/dL (ref 8.9–10.3)
Chloride: 105 mmol/L (ref 98–111)
Creatinine, Ser: 0.9 mg/dL (ref 0.44–1.00)
GFR, Estimated: >60 mL/min (ref >60)
Glucose, Bld: 378 mg/dL — ABNORMAL HIGH (ref 70–99)
Potassium: 4.1 mmol/L (ref 3.5–5.1)
Sodium: 138 mmol/L (ref 135–145)
Total Bilirubin: 0.1 mg/dL — ABNORMAL LOW (ref 0.3–1.2)
Total Protein: 6.8 g/dL (ref 6.5–8.1)

## 2021-08-05 LAB — EXPECTORATED SPUTUM ASSESSMENT W GRAM STAIN, RFLX TO RESP C

## 2021-08-05 LAB — GLUCOSE, CAPILLARY: Glucose-Capillary: 329 mg/dL — ABNORMAL HIGH (ref 70–99)

## 2021-08-05 LAB — PROCALCITONIN: Procalcitonin: <0.1 ng/mL

## 2021-08-05 LAB — STREP PNEUMONIAE URINARY ANTIGEN: Strep Pneumo Urinary Antigen: NEGATIVE

## 2021-08-05 LAB — HEMOGLOBIN A1C
Hgb A1c MFr Bld: 8.4 % — ABNORMAL HIGH (ref 4.8–5.6)
Mean Plasma Glucose: 194.38 mg/dL

## 2021-08-05 LAB — MAGNESIUM: Magnesium: 1.7 mg/dL (ref 1.7–2.4)

## 2021-08-05 LAB — PHOSPHORUS: Phosphorus: 2.5 mg/dL (ref 2.5–4.6)

## 2021-08-05 MED ORDER — INSULIN ASPART 100 UNIT/ML IJ SOLN: 0.0000 [IU] | Freq: Every day | INTRAMUSCULAR | Status: DC

## 2021-08-05 MED ORDER — GUAIFENESIN-DM 100-10 MG/5ML PO SYRP: 5.0000 mL | ORAL_SOLUTION | ORAL | 0 refills | Status: DC | PRN

## 2021-08-05 MED ORDER — AMOXICILLIN-POT CLAVULANATE 875-125 MG PO TABS: 1.0000 | ORAL_TABLET | Freq: Two times a day (BID) | ORAL | 0 refills | Status: AC

## 2021-08-05 MED ORDER — INSULIN GLARGINE-YFGN 100 UNIT/ML ~~LOC~~ SOLN
10.0000 [IU] | Freq: Two times a day (BID) | SUBCUTANEOUS | Status: DC
  Administered 2021-08-05: 10 [IU] via SUBCUTANEOUS
  Filled 2021-08-05 (×3): qty 0.1

## 2021-08-05 MED ORDER — GUAIFENESIN-DM 100-10 MG/5ML PO SYRP
5.0000 mL | ORAL_SOLUTION | ORAL | Status: DC | PRN
  Administered 2021-08-05: 5 mL via ORAL
  Filled 2021-08-05: qty 5

## 2021-08-05 MED ORDER — INSULIN ASPART 100 UNIT/ML IJ SOLN
0.0000 [IU] | Freq: Three times a day (TID) | INTRAMUSCULAR | Status: DC
  Administered 2021-08-05: 15 [IU] via SUBCUTANEOUS

## 2021-08-05 MED ORDER — INSULIN ASPART 100 UNIT/ML IJ SOLN
6.0000 [IU] | Freq: Three times a day (TID) | INTRAMUSCULAR | Status: DC
  Administered 2021-08-05: 6 [IU] via SUBCUTANEOUS

## 2021-08-05 NOTE — Progress Notes (Signed)
08/05/2021, 8:03 PM   Endocrinology follow-up note   Subjective:    Patient ID: [name], female    DOB: [birth year].  [name] is being seen in follow-up in the management of her currently uncontrolled type 2 diabetes, hypertension. [name], Brittney Baxter, MD.   Past Medical History:  Diagnosis Date   Allergy    Asthma    COPD (chronic obstructive pulmonary disease) ([place])    Diabetes mellitus without complication ([place])    Hypertension    Morbid obesity due to excess calories ([place])    Umbilical hernia     Past Surgical History:  Procedure Laterality Date   CARPAL TUNNEL RELEASE Right    CHOLECYSTECTOMY     COLONOSCOPY N/A 01/12/2015   Procedure: COLONOSCOPY;  Surgeon: Danie Binder, MD;  Location: [facility];  Service: Endoscopy;  Laterality: N/A;  0830   HYSTEROSCOPY WITH D & C N/A 04/01/2013   Procedure: DILATATION AND CURETTAGE /HYSTEROSCOPY;  Surgeon: Florian Buff, MD;  Location: [facility] ORS;  Service: Gynecology;  Laterality: N/A;   NASAL POLYP SURGERY      Social History   Socioeconomic History   Marital status: Married    Spouse name: Not on file   Number of children: 2   Years of education: Not on file   Highest education level: Not on file  Occupational History   Not on file  Tobacco Use   Smoking status: Former    Packs/day: 0.50    Years: 20.00    Pack years: 10.00    Types: Cigarettes    Quit date: 03/25/1997    Years since quitting: 24.3   Smokeless tobacco: Never  Vaping Use   Vaping Use: Never used  Substance and Sexual Activity   Alcohol use: Yes    Alcohol/week: 0.0 standard drinks    Comment: Ocassional   Drug use: No   Sexual activity: Yes    Birth control/protection: None, Post-menopausal  Other Topics Concern   Not on file  Social History Narrative   Not on file   Social Determinants of Health   Financial Resource Strain: Not on file  Food  Insecurity: Not on file  Transportation Needs: Not on file  Physical Activity: Not on file  Stress: Not on file  Social Connections: Not on file    Family History  Problem Relation Age of Onset   Diabetes Mother    Kidney disease Mother    Other Mother        renal failure   Hypertension Mother    Cancer Mother        ?uterine cancer    Diabetes Father    Cancer Father        pancreatic   Diabetes Sister    Kidney disease Sister    Diabetes Maternal Grandmother    Asthma Maternal Grandmother    COPD Maternal Grandmother        bronchitis   Hypertension Maternal Grandmother    Thyroid disease Maternal Grandmother    Hypertension Daughter    Colon cancer Neg Hx     Outpatient Encounter Medications as of 08/05/2021  Medication Sig  AMOXICILLIN PO Take 1 tablet by mouth 2 (two) times daily.   acetaZOLAMIDE (DIAMOX) 250 MG tablet Take 250 mg by mouth 2 (two) times daily.   amLODipine (NORVASC) 5 MG tablet Take 1 tablet by mouth daily.   amoxicillin-clavulanate (AUGMENTIN) 875-125 MG tablet Take 1 tablet by mouth 2 (two) times daily for 6 days.   aspirin 81 MG EC tablet Take 1 tablet by mouth daily.   B-D UF III MINI PEN NEEDLES 31G X 5 MM MISC USE ONCE DAILY   calcium carbonate (OS-CAL - DOSED IN MG OF ELEMENTAL CALCIUM) 1250 (500 Ca) MG tablet Take 1 tablet by mouth.   diclofenac sodium (VOLTAREN) 1 % GEL 4 (four) times daily as needed.   fluticasone (FLONASE) 50 MCG/ACT nasal spray Place 2 sprays into both nostrils daily.   Fluticasone-Salmeterol (ADVAIR) 250-50 MCG/DOSE AEPB Inhale 1 puff into the lungs 2 (two) times daily.    gabapentin (NEURONTIN) 100 MG capsule Take 1 capsule by mouth 2 (two) times daily.   glipiZIDE (GLUCOTROL XL) 5 MG 24 hr tablet TAKE 1 TABLET BY MOUTH EVERY DAY WITH BREAKFAST   guaiFENesin-dextromethorphan (ROBITUSSIN DM) 100-10 MG/5ML syrup Take 5 mLs by mouth every 4 (four) hours as needed for cough.   Insulin Glargine (TOUJEO MAX SOLOSTAR )  Inject 80 Units into the skin at bedtime.   ipratropium (ATROVENT) 0.06 % nasal spray Place 2 sprays into both nostrils 2 (two) times daily.   Ipratropium-Albuterol (COMBIVENT RESPIMAT IN) Inhale 1 puff into the lungs daily as needed (shortness of breath).   ipratropium-albuterol (DUONEB) 0.5-2.5 (3) MG/3ML SOLN Take 3 mLs by nebulization every 4 (four) hours as needed (Shortness of Breath).    losartan-hydrochlorothiazide (HYZAAR) 100-25 MG tablet Take 1 tablet by mouth daily.   metFORMIN (GLUCOPHAGE) 500 MG tablet Take 1 tablet (500 mg total) by mouth 2 (two) times daily with a meal.   Multiple Vitamin (MULTIVITAMIN WITH MINERALS) TABS tablet Take 1 tablet by mouth daily.   rosuvastatin (CRESTOR) 20 MG tablet Take 0.5 tablets (10 mg total) by mouth daily.   traMADol (ULTRAM) 50 MG tablet Take 50 mg by mouth 2 (two) times daily.   [DISCONTINUED] 0.9 %  sodium chloride infusion    [DISCONTINUED] acetaminophen (TYLENOL) suppository 650 mg    [DISCONTINUED] acetaminophen (TYLENOL) tablet 650 mg    [DISCONTINUED] amLODipine (NORVASC) tablet 5 mg    [DISCONTINUED] azithromycin (ZITHROMAX) 500 mg in sodium chloride 0.9 % 250 mL IVPB    [DISCONTINUED] cefTRIAXone (ROCEPHIN) 2 g in sodium chloride 0.9 % 100 mL IVPB    [DISCONTINUED] dextromethorphan-guaiFENesin (MUCINEX DM) 30-600 MG per 12 hr tablet 1 tablet    [DISCONTINUED] enoxaparin (LOVENOX) injection 50 mg    [DISCONTINUED] guaiFENesin-dextromethorphan (ROBITUSSIN DM) 100-10 MG/5ML syrup 5 mL    [DISCONTINUED] hydrochlorothiazide (HYDRODIURIL) tablet 25 mg    [DISCONTINUED] insulin aspart (novoLOG) injection 0-20 Units    [DISCONTINUED] insulin aspart (novoLOG) injection 0-5 Units    [DISCONTINUED] insulin aspart (novoLOG) injection 6 Units    [DISCONTINUED] insulin glargine-yfgn (SEMGLEE) injection 10 Units    [DISCONTINUED] ipratropium-albuterol (DUONEB) 0.5-2.5 (3) MG/3ML nebulizer solution 3 mL    [DISCONTINUED] ipratropium-albuterol  (DUONEB) 0.5-2.5 (3) MG/3ML nebulizer solution 3 mL    [DISCONTINUED] losartan (COZAAR) tablet 100 mg    [DISCONTINUED] ondansetron (ZOFRAN) injection 4 mg    [DISCONTINUED] ondansetron (ZOFRAN) tablet 4 mg    [DISCONTINUED] pantoprazole (PROTONIX) EC tablet 40 mg    [DISCONTINUED] rosuvastatin (CRESTOR) tablet 10 mg  No facility-administered encounter medications on file as of 08/05/2021.    ALLERGIES: No Known Allergies  VACCINATION STATUS: Immunization History  Administered Date(s) Administered   Influenza-Unspecified 06/09/2010, 06/09/2013   Pneumococcal Polysaccharide-23 08/05/2021    Diabetes She presents for her follow-up diabetic visit. She has type 2 diabetes mellitus. Onset time: She was diagnosed at approximate age of 7 years. Her disease course has been improving. There are no hypoglycemic associated symptoms. Pertinent negatives for hypoglycemia include no confusion, headaches, pallor or seizures. Associated symptoms include polydipsia and polyuria. Pertinent negatives for diabetes include no chest pain, no fatigue and no polyphagia. There are no hypoglycemic complications. Symptoms are improving. There are no diabetic complications. Risk factors for coronary artery disease include diabetes mellitus, dyslipidemia, family history, obesity, tobacco exposure, sedentary lifestyle, post-menopausal and hypertension. Current diabetic treatment includes insulin injections (She is currently on Basaglar 60 units twice daily,  Metformin 1000 mg p.o. twice daily, could not afford Januvia any longer, she remains on glipizide 5 mg XL p.o. daily breakfast.  ). Her weight is stable. She is following a generally unhealthy diet. When asked about meal planning, she reported none. She has not had a previous visit with a dietitian. She rarely participates in exercise. Her home blood glucose trend is decreasing steadily. Her breakfast blood glucose range is generally 140-180 mg/dl. Her overall blood  glucose range is 140-180 mg/dl. (She presents with inadequate glycemic monitoring.  Her recent labs show A1c of 8.4% improving from 13.5%.  She denies hypoglycemia.    She does not monitor blood glucose as recommended.     ) An ACE inhibitor/angiotensin II receptor blocker is being taken.  Hyperlipidemia This is a chronic problem. The current episode started more than 1 year ago. The problem is controlled. Exacerbating diseases include diabetes and obesity. Pertinent negatives include no chest pain, myalgias or shortness of breath. Current antihyperlipidemic treatment includes statins. Risk factors for coronary artery disease include diabetes mellitus, dyslipidemia, family history, hypertension, obesity, a sedentary lifestyle and post-menopausal.  Hypertension This is a chronic problem. The current episode started more than 1 year ago. Pertinent negatives include no chest pain, headaches, palpitations or shortness of breath. Risk factors for coronary artery disease include diabetes mellitus, dyslipidemia, obesity, post-menopausal state, smoking/tobacco exposure, sedentary lifestyle and family history. Past treatments include ACE inhibitors.   Review of systems  Constitutional: + Minimally fluctuating body weight,  current  Body mass index is 41.61 kg/m. , no fatigue, no subjective hyperthermia, no subjective hypothermia    Objective:    BP (!) 149/84    Pulse 94    Ht 5\' 1"  (1.549 m)    Wt 220 lb 3.2 oz (99.9 kg)    LMP 02/25/2019    BMI 41.61 kg/m   Wt Readings from Last 3 Encounters:  08/05/21 220 lb 3.2 oz (99.9 kg)  08/04/21 222 lb (100.7 kg)  06/20/21 223 lb 6.4 oz (101.3 kg)    Physical Exam- Limited  Constitutional:  Body mass index is 41.61 kg/m. , not in acute distress, normal state of mind    CMP     Component Value Date/Time   NA 138 08/05/2021 0531   NA 138 01/24/2021 0000   K 4.1 08/05/2021 0531   CL 105 08/05/2021 0531   CO2 23 08/05/2021 0531   GLUCOSE 378 (H)  08/05/2021 0531   BUN 13 08/05/2021 0531   BUN 14 01/24/2021 0000   CREATININE 0.90 08/05/2021 0531   CALCIUM 8.9 08/05/2021 0531  PROT 6.8 08/05/2021 0531   PROT 6.9 11/07/2020 0735   ALBUMIN 3.2 (L) 08/05/2021 0531   ALBUMIN 4.2 11/07/2020 0735   AST 23 08/05/2021 0531   ALT 26 08/05/2021 0531   ALKPHOS 50 08/05/2021 0531   BILITOT 0.1 (L) 08/05/2021 0531   BILITOT <0.2 11/07/2020 0735   GFRNONAA >60 08/05/2021 0531   GFRAA >60 04/21/2019 0854     Diabetic Labs (most recent): Lab Results  Component Value Date   HGBA1C 8.4 (H) 08/04/2021   HGBA1C 13.5 (H) 05/31/2021   HGBA1C 11.3 04/26/2021     Lipid Panel ( most recent) Lipid Panel     Component Value Date/Time   CHOL 113 01/24/2021 0000   CHOL 128 11/07/2020 0735   TRIG 102 01/24/2021 0000   HDL 35 01/24/2021 0000   HDL 38 (L) 11/07/2020 0735   CHOLHDL 3.4 11/07/2020 0735   CHOLHDL 3.5 04/21/2019 0855   VLDL 28 04/21/2019 0855   LDLCALC 59 01/24/2021 0000   LDLCALC 68 11/07/2020 0735   LABVLDL 22 11/07/2020 0735      Lab Results  Component Value Date   TSH 1.210 11/07/2020   TSH 2.110 01/16/2020   TSH 3.399 03/07/2013   FREET4 1.13 11/07/2020   FREET4 1.06 01/16/2020     Assessment & Plan:   1. Uncontrolled type 2 diabetes mellitus with hyperglycemia ([place])  - [name] has currently uncontrolled symptomatic type 2 DM since 61 years of age.  She presents with inadequate glycemic monitoring.  Her recent labs show A1c of 8.4% improving from 13.5%.  She denies hypoglycemia.    She does not monitor blood glucose as recommended.     - I had a long discussion with her about the progressive nature of diabetes and the pathology behind its complications. -her diabetes is complicated by obesity, non- engagement for management plan,  history of smoking and she remains at a high risk for more acute and chronic complications which include CAD, CVA, CKD, retinopathy, and neuropathy. These are all discussed in  detail with her.  - I have counseled her on diet  and weight management  by adopting a carbohydrate restricted/protein rich diet. Patient is encouraged to switch to  unprocessed or minimally processed     complex starch and increased protein intake (animal or plant source), fruits, and vegetables. -  she is advised to stick to a routine mealtimes to eat 3 meals  a day and avoid unnecessary snacks ( to snack only to correct hypoglycemia).  - she acknowledges that there is a room for improvement in her food and drink choices. - Suggestion is made for her to avoid simple carbohydrates  from her diet including Cakes, Sweet Desserts, Ice Cream, Soda (diet and regular), Sweet Tea, Candies, Chips, Cookies, Store Bought Juices, Alcohol , Artificial Sweeteners,  Coffee Creamer, and "Sugar-free" Products, Lemonade. This will help patient to have more stable blood glucose profile and potentially avoid unintended weight gain.  The following Lifestyle Medicine recommendations according to [facility]) were discussed and and offered to patient and she  agrees to start the journey:  A. Whole Foods, Plant-Based Nutrition comprising of fruits and vegetables, plant-based proteins, whole-grain carbohydrates was discussed in detail with the patient.   A list for source of those nutrients were also provided to the patient.  Patient will use only water or unsweetened tea for hydration. B.  The need to stay away from risky substances including alcohol,  smoking; obtaining 7 to 9 hours of restorative sleep, at least 150 minutes of moderate intensity exercise weekly, the importance of healthy social connections,  and stress management techniques were discussed. C.  A full color page of  Calorie density of various food groups per pound showing examples of each food groups was provided to the patient.    - she has been scheduled with [name], [name], [name] for diabetes education.  - I have  approached her with the following individualized plan to manage  her diabetes and patient agrees:    -In light of her presentation with improved glycemic profile, and the fact that she is not monitoring blood glucose properly, she will not be considered for MDI.  She is advised to lower her Toujeo to 80 units nightly.  - she is encouraged to call clinic for blood glucose levels less than 70 or above 200 mg /dl. - she is advised to continue Metformin 500 mg p.o. twice daily, glipizide 5 mg XL p.o. daily at breakfast.    - Specific targets for  A1c;  LDL, HDL,  and Triglycerides were discussed with the patient.  2) Blood Pressure /Hypertension: Her blood pressure is not  controlled to target.  The above described WF PB diet will help with hypertension and hyperlipidemia as well.  Amlodipine 5 mg was recently added to her Hyzaar.  She is advised to continue  losartan/HCHer blood pressure is not controlled to target.TZ 100/25 milligrams p.o. daily.   3) Lipids/Hyperlipidemia:   Review of her recent lipid panel showed  controlled  LDL at 68 .  she  is advised to continue Crestor 20 mg p.o. daily at bedtime.   Side effects and precautions discussed with her.    4)  Weight/Diet: Her BMI is 41.6---   clearly complicating her diabetes care.   she is  a candidate for modest weight loss. I discussed with her the fact that loss of 5 - 10% of her  current body weight will have the most impact on her diabetes management.  Exercise, and detailed carbohydrates information provided  -  detailed on discharge instructions.  5) Chronic Care/Health Maintenance:  -she  is on ACEI/ARB and Statin medications and  is encouraged to initiate and continue to follow up with Ophthalmology, Dentist,  Podiatrist at least yearly or according to recommendations, and advised to  stay away from smoking. I have recommended yearly flu vaccine and pneumonia vaccine at least every 5 years; moderate intensity exercise for up to 150  minutes weekly; and  sleep for at least 7 hours a day.  Her ABI was recently normal on April 26, 2020.  - she is  advised to maintain close follow up with Brittney Meiers, MD for primary care needs, as well as her other providers for optimal and coordinated care.    I spent 44 minutes in the care of the patient today including review of labs from [place], Lipids, Thyroid Function, Hematology (current and previous including abstractions from other facilities); face-to-face time discussing  her blood glucose readings/logs, discussing hypoglycemia and hyperglycemia episodes and symptoms, medications doses, her options of short and long term treatment based on the latest standards of care / guidelines;  discussion about incorporating lifestyle medicine;  and documenting the encounter.    Please refer to Patient Instructions for Blood Glucose Monitoring and Insulin/Medications Dosing Guide"  in media tab for additional information. Please  also refer to " Patient Self Inventory" in the Media  tab  for reviewed elements of pertinent patient history.  [name] participated in the discussions, expressed understanding, and voiced agreement with the above plans.  All questions were answered to her satisfaction. she is encouraged to contact clinic should she have any questions or concerns prior to her return visit.   Follow up plan: - Return in about 3 months (around 11/02/2021) for Bring Meter and Logs- A1c in Office.  Glade Lloyd, MD [facility] [address] [place], [place] [address] Phone: [phone]  Fax: [phone]    08/05/2021, 8:03 PM  This note was partially dictated with voice recognition software. Similar sounding words can be transcribed inadequately or may not  be corrected upon review.

## 2021-08-05 NOTE — Progress Notes (Signed)
Discharge instructions given. Pt verbalized understanding. IV removed.

## 2021-08-05 NOTE — Progress Notes (Signed)
Inpatient Diabetes Program Recommendations  AACE/ADA: New Consensus Statement on Inpatient Glycemic Control (2015)  Target Ranges:  Prepandial:   less than 140 mg/dL      Peak postprandial:   less than 180 mg/dL (1-2 hours)      Critically ill patients:  140 - 180 mg/dL   Lab Results  Component Value Date   GLUCAP 329 (H) 08/05/2021   HGBA1C 8.4 (H) 08/04/2021    Review of Glycemic Control  Diabetes history: DM 2 Outpatient Diabetes medications: Toujeo 100 units qhs, Metformin 500 mg bid, Glipizide 5 mg daily Current orders for Inpatient glycemic control:  Semglee 10 units Novolog 0-20 units tid + hs Novolog 6 units tid meal coverage  Solumedrol 125 mg x1 dose, 40 mg x1 dose Glucose 258 on presentation prior to administration of steroids  Inpatient Diabetes Program Recommendations:    -  Increase Semglee to 20 units  Thanks,  Tama Headings RN, MSN, BC-ADM Inpatient Diabetes Coordinator Team Pager [phone] (8a-5p)

## 2021-08-05 NOTE — Patient Instructions (Signed)

## 2021-08-05 NOTE — TOC Progression Note (Signed)
Transition of Care [facility]) - Progression Note    Patient Details  Name: [name] MRN: [id number] Date of Birth: 11-26-1960  Transition of Care [facility] CM/SW Contact  Boneta Lucks, RN Phone Number: 08/05/2021, 10:39 AM  Clinical Narrative:      Transition of Care (TOC) Screening Note   Transition of Care Department [facility]) has reviewed patient and no TOC needs have been identified at this time. We will continue to monitor patient advancement through interdisciplinary progression rounds. If new patient transition needs arise, please place a TOC consult.

## 2021-08-05 NOTE — Discharge Summary (Signed)
Physician Discharge Summary  [name] WUJ:[id number] DOB: [birth year] DOA: 08/04/2021  PCP: Carrolyn Meiers, MD  Admit date: 08/04/2021  Discharge date: 08/05/2021  Admitted From:Home  Disposition:  Home  Recommendations for Outpatient Follow-up:  Follow up with PCP in 1-2 weeks Continue on home medications as prior Continue on Augmentin as prescribed for 6 more days to complete total 7-day course of treatment for pneumonia No further need for steroids noted  Home Health: None  Equipment/Devices: None  Discharge Condition:Stable  CODE STATUS: Full  Diet recommendation: Heart Healthy/carb modified  Brief/Interim Summary: Per HPI: [name] is a 61 y.o. female with medical history significant of COPD/asthma, T2DM, idiopathic intracranial hypertension and CSF leak (follows with neurosurgery at [facility]) who complains of diarrhea and vomiting which started 2 days ago and subsided yesterday.  She also complained of productive cough of thick greenish phlegm and onset of shortness of breath which worsens with ambulation, this was associated with increased weakness.  Breathing treatment at home provided no relief, she developed fever at home today, so she decided to go to the ED for further evaluation and management.  She denies headache, blurry vision, palpitations, abdominal pain, numbness or tingling.  -Patient was admitted with sepsis, ruled in POA due to multifocal pneumonia with suspected COPD exacerbation as well as acute hypoxemic respiratory failure.  She has made a dramatic improvement this morning and is eager for discharge.  She no longer requires any further oxygen and has ambulated without any demonstrating need for oxygen requirement.  She was noted to become somewhat hyperglycemic from her steroid use and these have been discontinued as well since she has no further bronchospasm or wheezing.  She was started on azithromycin and Rocephin, but will be  transition to oral Augmentin at this time and appears stable for discharge.  Her laboratory data is otherwise unremarkable.  She should have gradual resolution of her hyperglycemic state as her steroids have been discontinued and as she resumes her usual home doses of insulin.  No other acute events noted this admission.  Discharge Diagnoses:  Principal Problem:   Multifocal pneumonia Active Problems:   Essential hypertension, benign   Morbid obesity ([place])   Uncontrolled type 2 diabetes mellitus with hyperglycemia ([place])   Mixed hyperlipidemia   Acute respiratory failure with hypoxia (HCC)   Sepsis ([place])   COPD with acute exacerbation (HCC)   Idiopathic intracranial hypertension   Diarrhea   Vomiting  Principal discharge diagnosis: Sepsis-ruled in secondary to multifocal pneumonia with associated acute hypoxemic respiratory failure.  Discharge Instructions  Discharge Instructions     Diet - low sodium heart healthy   Complete by: As directed    Increase activity slowly   Complete by: As directed       Allergies as of 08/05/2021   No Known Allergies      Medication List     TAKE these medications    acetaZOLAMIDE 250 MG tablet Commonly known as: DIAMOX Take 250 mg by mouth 2 (two) times daily.   amLODipine 5 MG tablet Commonly known as: NORVASC Take 1 tablet by mouth daily.   amoxicillin-clavulanate 875-125 MG tablet Commonly known as: Augmentin Take 1 tablet by mouth 2 (two) times daily for 6 days.   aspirin 81 MG EC tablet Take 1 tablet by mouth daily.   B-D UF III MINI PEN NEEDLES 31G X 5 MM Misc Generic drug: Insulin Pen Needle USE ONCE DAILY   calcium carbonate 1250 (500  Ca) MG tablet Commonly known as: OS-CAL - dosed in mg of elemental calcium Take 1 tablet by mouth.   diclofenac sodium 1 % Gel Commonly known as: VOLTAREN 4 (four) times daily as needed.   fluticasone 50 MCG/ACT nasal spray Commonly known as: FLONASE Place 2 sprays into both  nostrils daily.   Fluticasone-Salmeterol 250-50 MCG/DOSE Aepb Commonly known as: ADVAIR Inhale 1 puff into the lungs 2 (two) times daily.   gabapentin 100 MG capsule Commonly known as: NEURONTIN Take 1 capsule by mouth 2 (two) times daily.   glipiZIDE 5 MG 24 hr tablet Commonly known as: GLUCOTROL XL TAKE 1 TABLET BY MOUTH EVERY DAY WITH BREAKFAST   guaiFENesin-dextromethorphan 100-10 MG/5ML syrup Commonly known as: ROBITUSSIN DM Take 5 mLs by mouth every 4 (four) hours as needed for cough.   ipratropium 0.06 % nasal spray Commonly known as: ATROVENT Place 2 sprays into both nostrils 2 (two) times daily.   ipratropium-albuterol 0.5-2.5 (3) MG/3ML Soln Commonly known as: DUONEB Take 3 mLs by nebulization every 4 (four) hours as needed (Shortness of Breath).   COMBIVENT RESPIMAT IN Inhale 1 puff into the lungs daily as needed (shortness of breath).   losartan-hydrochlorothiazide 100-25 MG tablet Commonly known as: HYZAAR Take 1 tablet by mouth daily.   metFORMIN 500 MG tablet Commonly known as: GLUCOPHAGE Take 1 tablet (500 mg total) by mouth 2 (two) times daily with a meal.   multivitamin with minerals Tabs tablet Take 1 tablet by mouth daily.   rosuvastatin 20 MG tablet Commonly known as: CRESTOR Take 0.5 tablets (10 mg total) by mouth daily.   TOUJEO MAX SOLOSTAR [place] Inject 100 Units into the skin at bedtime.   traMADol 50 MG tablet Commonly known as: ULTRAM Take 50 mg by mouth 2 (two) times daily.        Follow-up Information     Fanta, [name], MD. Schedule an appointment as soon as possible for a visit in 1 week(s).   Specialty: Internal Medicine Contact information: [place] [address] [phone]                No Known Allergies  Consultations: None   Procedures/Studies: CT Head Wo Contrast  Result Date: 07/18/2021 CLINICAL DATA:  Headache fall EXAM: CT HEAD WITHOUT CONTRAST TECHNIQUE: Contiguous  axial images were obtained from the base of the skull through the vertex without intravenous contrast. RADIATION DOSE REDUCTION: This exam was performed according to the departmental dose-optimization program which includes automated exposure control, adjustment of the mA and/or kV according to patient size and/or use of iterative reconstruction technique. COMPARISON:  None. FINDINGS: Brain: No acute territorial infarction, hemorrhage or intracranial mass. The ventricles are nonenlarged. Vascular: No hyperdense vessels.  Carotid vascular calcification Skull: Normal. Negative for fracture or focal lesion. Sinuses/Orbits: Moderate mucosal disease in the maxillary and ethmoid sinuses with frothy debris in the right maxillary sinus Other: None IMPRESSION: 1. No CT evidence for acute intracranial abnormality. 2. Sinus disease Electronically Signed   By: Donavan Foil M.D.   On: 07/18/2021 20:15   DG Chest Port 1 View  Result Date: 08/04/2021 CLINICAL DATA:  Dyspnea EXAM: PORTABLE CHEST 1 VIEW COMPARISON:  07/18/2021 FINDINGS: Normal heart size. Aortic atherosclerosis. Interval development of multifocal patchy interstitial and airspace opacities bilaterally, more pronounced within the periphery of the lungs. No pleural effusion or pneumothorax. IMPRESSION: Multifocal patchy interstitial and airspace opacities bilaterally, more pronounced within the periphery of the lungs. Findings are concerning for  multifocal atypical/viral pneumonia (including COVID-19). Electronically Signed   By: Davina Poke D.O.   On: 08/04/2021 17:38   DG Chest Portable 1 View  Result Date: 07/18/2021 CLINICAL DATA:  Cough, syncope EXAM: PORTABLE CHEST 1 VIEW COMPARISON:  05/31/2021 FINDINGS: Lungs are clear.  No pleural effusion or pneumothorax. The heart is normal in size. Mild degenerative changes of the thoracic spine. IMPRESSION: No evidence of acute cardiopulmonary disease. Electronically Signed   By: Julian Hy M.D.   On:  07/18/2021 23:06     Discharge Exam: Vitals:   08/05/21 1032 08/05/21 1047  BP: (!) 147/71   Pulse: 95   Resp: 18   Temp:    SpO2: 96% 94%   Vitals:   08/05/21 0213 08/05/21 0716 08/05/21 1032 08/05/21 1047  BP:   (!) 147/71   Pulse:   95   Resp:   18   Temp:      TempSrc:      SpO2: 96% 96% 96% 94%  Weight:      Height:        General: Pt is alert, awake, not in acute distress, obese Cardiovascular: RRR, S1/S2 +, no rubs, no gallops Respiratory: CTA bilaterally, no wheezing, no rhonchi Abdominal: Soft, NT, ND, bowel sounds + Extremities: no edema, no cyanosis    The results of significant diagnostics from this hospitalization (including imaging, microbiology, ancillary and laboratory) are listed below for reference.     Microbiology: Recent Results (from the past 240 hour(s))  Expectorated Sputum Assessment w Gram Stain, Rflx to Resp Cult     Status: None   Collection Time: 08/04/21  4:35 AM   Specimen: Sputum  Result Value Ref Range Status   Specimen Description SPUTUM  Final   Special Requests NONE  Final   Sputum evaluation   Final    Sputum specimen not acceptable for testing.  Please recollect.   Performed at [facility], [address] [place], [place] [address]    Report Status 08/05/2021 FINAL  Final  Resp Panel by RT-PCR (Flu A&B, Covid) Nasopharyngeal Swab     Status: None   Collection Time: 08/04/21  5:23 PM   Specimen: Nasopharyngeal Swab; Nasopharyngeal(NP) swabs in vial transport medium  Result Value Ref Range Status   SARS Coronavirus 2 by RT PCR NEGATIVE NEGATIVE Final    Comment: (NOTE) SARS-CoV-2 target nucleic acids are NOT DETECTED.  The SARS-CoV-2 RNA is generally detectable in upper respiratory specimens during the acute phase of infection. The lowest concentration of SARS-CoV-2 viral copies this assay can detect is 138 copies/mL. A negative result does not preclude SARS-Cov-2 infection and should not be used as the sole basis for  treatment or other patient management decisions. A negative result may occur with  improper specimen collection/handling, submission of specimen other than nasopharyngeal swab, presence of viral mutation(s) within the areas targeted by this assay, and inadequate number of viral copies(<138 copies/mL). A negative result must be combined with clinical observations, patient history, and epidemiological information. The expected result is Negative.  Fact Sheet for Patients:  EntrepreneurPulse.com.au  Fact Sheet for Healthcare Providers:  IncredibleEmployment.be  This test is no t yet approved or cleared by the [place] FDA and  has been authorized for detection and/or diagnosis of SARS-CoV-2 by FDA under an Emergency Use Authorization (EUA). This EUA will remain  in effect (meaning this test can be used) for the duration of the COVID-19 declaration under Section 564(b)(1) of the Act, 21 U.S.C.section 360bbb-3(b)(1),  unless the authorization is terminated  or revoked sooner.       Influenza A by PCR NEGATIVE NEGATIVE Final   Influenza B by PCR NEGATIVE NEGATIVE Final    Comment: (NOTE) The Xpert Xpress SARS-CoV-2/FLU/RSV plus assay is intended as an aid in the diagnosis of influenza from Nasopharyngeal swab specimens and should not be used as a sole basis for treatment. Nasal washings and aspirates are unacceptable for Xpert Xpress SARS-CoV-2/FLU/RSV testing.  Fact Sheet for Patients: EntrepreneurPulse.com.au  Fact Sheet for Healthcare Providers: IncredibleEmployment.be  This test is not yet approved or cleared by the [place] FDA and has been authorized for detection and/or diagnosis of SARS-CoV-2 by FDA under an Emergency Use Authorization (EUA). This EUA will remain in effect (meaning this test can be used) for the duration of the COVID-19 declaration under Section 564(b)(1) of the Act, 21  U.S.C. section 360bbb-3(b)(1), unless the authorization is terminated or revoked.  Performed at [facility], [address] [place], [place] [address]      Labs: BNP (last 3 results) Recent Labs    05/31/21 1702  BNP 33.2   Basic Metabolic Panel: Recent Labs  Lab 08/04/21 1722 08/05/21 0531  NA 138 138  K 4.4 4.1  CL 104 105  CO2 26 23  GLUCOSE 258* 378*  BUN 13 13  CREATININE 0.82 0.90  CALCIUM 9.2 8.9  MG  --  1.7  PHOS  --  2.5   Liver Function Tests: Recent Labs  Lab 08/04/21 1722 08/05/21 0531  AST 28 23  ALT 27 26  ALKPHOS 54 50  BILITOT 0.3 0.1*  PROT 7.5 6.8  ALBUMIN 3.5 3.2*   Recent Labs  Lab 08/04/21 1722  LIPASE 27   No results for input(s): AMMONIA in the last 168 hours. CBC: Recent Labs  Lab 08/04/21 1722 08/05/21 0531  WBC 3.8* 3.2*  NEUTROABS 2.3  --   HGB 13.8 12.2  HCT 42.2 38.3  MCV 83.2 83.3  PLT 186 194   Cardiac Enzymes: No results for input(s): CKTOTAL, CKMB, CKMBINDEX, TROPONINI in the last 168 hours. BNP: Invalid input(s): POCBNP CBG: Recent Labs  Lab 08/04/21 2153 08/05/21 0719  GLUCAP 377* 329*   D-Dimer No results for input(s): DDIMER in the last 72 hours. Hgb A1c Recent Labs    08/04/21 1722  HGBA1C 8.4*   Lipid Profile No results for input(s): CHOL, HDL, LDLCALC, TRIG, CHOLHDL, LDLDIRECT in the last 72 hours. Thyroid function studies No results for input(s): TSH, T4TOTAL, T3FREE, THYROIDAB in the last 72 hours.  Invalid input(s): FREET3 Anemia work up No results for input(s): VITAMINB12, FOLATE, FERRITIN, TIBC, IRON, RETICCTPCT in the last 72 hours. Urinalysis    Component Value Date/Time   COLORURINE YELLOW 07/18/2021 2236   APPEARANCEUR CLEAR 07/18/2021 2236   LABSPEC 1.011 07/18/2021 2236   PHURINE 5.0 07/18/2021 2236   GLUCOSEU NEGATIVE 07/18/2021 2236   HGBUR NEGATIVE 07/18/2021 2236   BILIRUBINUR NEGATIVE 07/18/2021 2236   BILIRUBINUR negative 12/28/2020 1732   KETONESUR 5 (A)  07/18/2021 2236   PROTEINUR NEGATIVE 07/18/2021 2236   UROBILINOGEN 1.0 12/28/2020 1732   UROBILINOGEN 0.2 03/25/2013 1445   NITRITE NEGATIVE 07/18/2021 2236   LEUKOCYTESUR NEGATIVE 07/18/2021 2236   Sepsis Labs Invalid input(s): PROCALCITONIN,  WBC,  LACTICIDVEN Microbiology Recent Results (from the past 240 hour(s))  Expectorated Sputum Assessment w Gram Stain, Rflx to Resp Cult     Status: None   Collection Time: 08/04/21  4:35 AM   Specimen:  Sputum  Result Value Ref Range Status   Specimen Description SPUTUM  Final   Special Requests NONE  Final   Sputum evaluation   Final    Sputum specimen not acceptable for testing.  Please recollect.   Performed at [facility], [address], [place], [place] [address]    Report Status 08/05/2021 FINAL  Final  Resp Panel by RT-PCR (Flu A&B, Covid) Nasopharyngeal Swab     Status: None   Collection Time: 08/04/21  5:23 PM   Specimen: Nasopharyngeal Swab; Nasopharyngeal(NP) swabs in vial transport medium  Result Value Ref Range Status   SARS Coronavirus 2 by RT PCR NEGATIVE NEGATIVE Final    Comment: (NOTE) SARS-CoV-2 target nucleic acids are NOT DETECTED.  The SARS-CoV-2 RNA is generally detectable in upper respiratory specimens during the acute phase of infection. The lowest concentration of SARS-CoV-2 viral copies this assay can detect is 138 copies/mL. A negative result does not preclude SARS-Cov-2 infection and should not be used as the sole basis for treatment or other patient management decisions. A negative result may occur with  improper specimen collection/handling, submission of specimen other than nasopharyngeal swab, presence of viral mutation(s) within the areas targeted by this assay, and inadequate number of viral copies(<138 copies/mL). A negative result must be combined with clinical observations, patient history, and epidemiological information. The expected result is Negative.  Fact Sheet for Patients:   EntrepreneurPulse.com.au  Fact Sheet for Healthcare Providers:  IncredibleEmployment.be  This test is no t yet approved or cleared by the [place] FDA and  has been authorized for detection and/or diagnosis of SARS-CoV-2 by FDA under an Emergency Use Authorization (EUA). This EUA will remain  in effect (meaning this test can be used) for the duration of the COVID-19 declaration under Section 564(b)(1) of the Act, 21 U.S.C.section 360bbb-3(b)(1), unless the authorization is terminated  or revoked sooner.       Influenza A by PCR NEGATIVE NEGATIVE Final   Influenza B by PCR NEGATIVE NEGATIVE Final    Comment: (NOTE) The Xpert Xpress SARS-CoV-2/FLU/RSV plus assay is intended as an aid in the diagnosis of influenza from Nasopharyngeal swab specimens and should not be used as a sole basis for treatment. Nasal washings and aspirates are unacceptable for Xpert Xpress SARS-CoV-2/FLU/RSV testing.  Fact Sheet for Patients: EntrepreneurPulse.com.au  Fact Sheet for Healthcare Providers: IncredibleEmployment.be  This test is not yet approved or cleared by the [place] FDA and has been authorized for detection and/or diagnosis of SARS-CoV-2 by FDA under an Emergency Use Authorization (EUA). This EUA will remain in effect (meaning this test can be used) for the duration of the COVID-19 declaration under Section 564(b)(1) of the Act, 21 U.S.C. section 360bbb-3(b)(1), unless the authorization is terminated or revoked.  Performed at [facility], [address], [place], [place] [address]      Time coordinating discharge: 35 minutes  SIGNED:   Rodena Goldmann, DO [facility] 08/05/2021, 10:54 AM  If 7PM-7AM, please contact night-coverage [email or web address]

## 2021-08-06 LAB — LEGIONELLA PNEUMOPHILA SEROGP 1 UR AG: L. pneumophila Serogp 1 Ur Ag: NEGATIVE

## 2021-08-12 ENCOUNTER — Encounter: Payer: Self-pay | Admitting: Interventional Cardiology

## 2021-08-12 ENCOUNTER — Other Ambulatory Visit: Payer: Self-pay | Admitting: "Endocrinology

## 2021-08-12 ENCOUNTER — Ambulatory Visit (INDEPENDENT_AMBULATORY_CARE_PROVIDER_SITE_OTHER): Payer: BC Managed Care – PPO | Admitting: Interventional Cardiology

## 2021-08-12 ENCOUNTER — Other Ambulatory Visit: Payer: Self-pay

## 2021-08-12 VITALS — BP 128/82 | HR 101 | Ht 61.0 in | Wt 213.4 lb

## 2021-08-12 DIAGNOSIS — R55 Syncope and collapse: Secondary | ICD-10-CM | POA: Diagnosis not present

## 2021-08-12 DIAGNOSIS — E782 Mixed hyperlipidemia: Secondary | ICD-10-CM

## 2021-08-12 DIAGNOSIS — I10 Essential (primary) hypertension: Secondary | ICD-10-CM

## 2021-08-12 DIAGNOSIS — Z794 Long term (current) use of insulin: Secondary | ICD-10-CM

## 2021-08-12 DIAGNOSIS — E1159 Type 2 diabetes mellitus with other circulatory complications: Secondary | ICD-10-CM | POA: Diagnosis not present

## 2021-08-12 DIAGNOSIS — I700 Atherosclerosis of aorta: Secondary | ICD-10-CM

## 2021-08-12 NOTE — Patient Instructions (Signed)
Medication Instructions:  ?Your physician recommends that you continue on your current medications as directed. Please refer to the Current Medication list given to you today. ? ?*If you need a refill on your cardiac medications before your next appointment, please call your pharmacy* ? ? ?Lab Work: ?none ?If you have labs (blood work) drawn today and your tests are completely normal, you will receive your results only by: ?MyChart Message (if you have MyChart) OR ?A paper copy in the mail ?If you have any lab test that is abnormal or we need to change your treatment, we will call you to review the results. ? ? ?Testing/Procedures: ?Your physician has requested that you have an echocardiogram. Echocardiography is a painless test that uses sound waves to create images of your heart. It provides your doctor with information about the size and shape of your heart and how well your heart?s chambers and valves are working. This procedure takes approximately one hour. There are no restrictions for this procedure. ? ? ? ?Follow-Up: ?At [facility], you and your health needs are our priority.  As part of our continuing mission to provide you with exceptional heart care, we have created designated Provider Care Teams.  These Care Teams include your primary Cardiologist (physician) and Advanced Practice Providers (APPs -  Physician Assistants and Nurse Practitioners) who all work together to provide you with the care you need, when you need it. ? ?We recommend signing up for the patient portal called "MyChart".  Sign up information is provided on this After Visit Summary.  MyChart is used to connect with patients for Virtual Visits (Telemedicine).  Patients are able to view lab/test results, encounter notes, upcoming appointments, etc.  Non-urgent messages can be sent to your provider as well.   ?To learn more about what you can do with MyChart, go to [email or web address].   ? ?Your next appointment:   ?As needed ? ?The  format for your next appointment:   ?In Person ? ?Provider:   ?Larae Grooms, MD   ? ? ?Other Instructions ? ? ?

## 2021-08-12 NOTE — Progress Notes (Signed)
?  ?Cardiology Office Note ? ? ?Date:  08/12/2021  ? ?ID:  [name], DOB Jul 02, 1960, MRN [id number] ? ?PCP:  Brittney Meiers, MD  ? ? ?No chief complaint on file. ? ?syncope ? ?Wt Readings from Last 3 Encounters:  ?08/12/21 213 lb 6.4 oz (96.8 kg)  ?08/05/21 220 lb 3.2 oz (99.9 kg)  ?08/04/21 222 lb (100.7 kg)  ?  ? ?  ?History of Present Illness: ?[name] is a 61 y.o. female who is being seen today for the evaluation of syncope at the request of Brittney Chapel, MD.  ? ?medical history significant of COPD/asthma, T2DM, idiopathic intracranial hypertension and CSF leak (follows with neurosurgery at [facility]). ? ?She was hospitalized in February 2023 with diarrhea and vomiting along with a productive cough.  Records show: "Patient was admitted with sepsis, ruled in POA due to multifocal pneumonia with suspected COPD exacerbation as well as acute hypoxemic respiratory failure.  She has made a dramatic improvement." ? ?She was sent home within a couple of days. ? ?Prior to the hospital stay, she had passed out.  The diarrhea had started. She thinks she was dehydrated.  SHe feels much better now.  ? 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  ? ?Denies : Chest pain. Dizziness. Leg edema. Nitroglycerin use. Orthopnea. Palpitations. Paroxysmal nocturnal dyspnea. Shortness of breath. Syncope.   ? ?Past Medical History:  ?Diagnosis Date  ? Allergy   ? Asthma   ? COPD (chronic obstructive pulmonary disease) ([place])   ? Diabetes mellitus without complication ([place])   ? Hypertension   ? Morbid obesity due to excess calories ([place])   ? Umbilical hernia   ? ? ?Past Surgical History:  ?Procedure Laterality Date  ? CARPAL TUNNEL RELEASE Right   ? CHOLECYSTECTOMY    ? COLONOSCOPY N/A 01/12/2015  ? Procedure: COLONOSCOPY;  Surgeon: Danie Binder, MD;  Location: [facility] SU[facility];  Service: Endoscopy;  Laterality: N/A;  0830  ? HYSTEROSCOPY WITH D & C N/A 04/01/2013  ? Procedure: DILATATION AND  CURETTAGE /HYSTEROSCOPY;  Surgeon: Florian Buff, MD;  Location: [facility] ORS;  Service: Gynecology;  Laterality: N/A;  ? NASAL POLYP SURGERY    ? ? ? ?Current Outpatient Medications  ?Medication Sig Dispense Refill  ? acetaZOLAMIDE (DIAMOX) 250 MG tablet Take 250 mg by mouth 2 (two) times daily.    ? amLODipine (NORVASC) 5 MG tablet Take 1 tablet by mouth daily.    ? AMOXICILLIN PO Take 1 tablet by mouth 2 (two) times daily.    ? aspirin 81 MG EC tablet Take 1 tablet by mouth daily.    ? B-D UF III MINI PEN NEEDLES 31G X 5 MM MISC USE ONCE DAILY AS DIRECTED 100 each 3  ? calcium carbonate (OS-CAL - DOSED IN MG OF ELEMENTAL CALCIUM) 1250 (500 Ca) MG tablet Take 1 tablet by mouth.    ? diclofenac sodium (VOLTAREN) 1 % GEL 4 (four) times daily as needed.    ? fluticasone (FLONASE) 50 MCG/ACT nasal spray Place 2 sprays into both nostrils daily.    ? Fluticasone-Salmeterol (ADVAIR) 250-50 MCG/DOSE AEPB Inhale 1 puff into the lungs 2 (two) times daily.     ? gabapentin (NEURONTIN) 100 MG capsule Take 1 capsule by mouth 2 (two) times daily.    ? glipiZIDE (GLUCOTROL XL) 5 MG 24 hr tablet TAKE 1 TABLET BY MOUTH EVERY DAY WITH BREAKFAST 90 tablet 1  ? guaiFENesin-dextromethorphan (ROBITUSSIN  DM) 100-10 MG/5ML syrup Take 5 mLs by mouth every 4 (four) hours as needed for cough. 118 mL 0  ? Insulin Glargine (TOUJEO MAX SOLOSTAR [place]) Inject 80 Units into the skin at bedtime.    ? ipratropium (ATROVENT) 0.06 % nasal spray Place 2 sprays into both nostrils 2 (two) times daily.    ? Ipratropium-Albuterol (COMBIVENT RESPIMAT IN) Inhale 1 puff into the lungs daily as needed (shortness of breath).    ? ipratropium-albuterol (DUONEB) 0.5-2.5 (3) MG/3ML SOLN Take 3 mLs by nebulization every 4 (four) hours as needed (Shortness of Breath).     ? losartan-hydrochlorothiazide (HYZAAR) 100-25 MG tablet Take 1 tablet by mouth daily.    ? metFORMIN (GLUCOPHAGE) 500 MG tablet Take 1 tablet (500 mg total) by mouth 2 (two) times daily with a meal. 180  tablet 0  ? Multiple Vitamin (MULTIVITAMIN WITH MINERALS) TABS tablet Take 1 tablet by mouth daily.    ? rosuvastatin (CRESTOR) 20 MG tablet Take 0.5 tablets (10 mg total) by mouth daily.    ? traMADol (ULTRAM) 50 MG tablet Take 50 mg by mouth 2 (two) times daily.  0  ? ?No current facility-administered medications for this visit.  ? ? ?Allergies:   Patient has no known allergies.  ? ? ?Social History:  The patient  reports that she quit smoking about 24 years ago. Her smoking use included cigarettes. She has a 10.00 pack-year smoking history. She has never used smokeless tobacco. She reports current alcohol use. She reports that she does not use drugs.  ? ?Family History:  The patient's family history includes Asthma in her maternal grandmother; COPD in her maternal grandmother; Cancer in her father and mother; Diabetes in her father, maternal grandmother, mother, and sister; Hypertension in her daughter, maternal grandmother, and mother; Kidney disease in her mother and sister; Other in her mother; Thyroid disease in her maternal grandmother.  ? ? ?ROS:  Please see the history of present illness.   Otherwise, review of systems are positive for recent illness as above.   All other systems are reviewed and negative.  ? ? ?PHYSICAL EXAM: ?VS:  BP 128/82   Pulse (!) 101   Ht '5\' 1"'$  (1.549 m)   Wt 213 lb 6.4 oz (96.8 kg)   LMP 02/25/2019   SpO2 95%   BMI 40.32 kg/m?  , BMI Body mass index is 40.32 kg/m?. ?GEN: Well nourished, well developed, in no acute distress ?HEENT: normal ?Neck: no JVD, carotid bruits, or masses ?Cardiac: RRR; no murmurs, rubs, or gallops,no edema  ?Respiratory:  clear to auscultation bilaterally, normal work of breathing ?GI: soft, nontender, nondistended, + BS ?MS: no deformity or atrophy ?Skin: warm and dry, no rash ?Neuro:  Strength and sensation are intact ?Psych: euthymic mood, full affect ? ? ?EKG:   ?The ekg ordered 07/2021 demonstrates sinus tach ? ? ?Recent Labs: ?11/07/2020: TSH  1.210 ?05/31/2021: B Natriuretic Peptide 66.0 ?08/05/2021: ALT 26; BUN 13; Creatinine, Ser 0.90; Hemoglobin 12.2; Magnesium 1.7; Platelets 194; Potassium 4.1; Sodium 138  ? ?Lipid Panel ?   ?Component Value Date/Time  ? CHOL 113 01/24/2021 0000  ? CHOL 128 11/07/2020 0735  ? TRIG 102 01/24/2021 0000  ? HDL 35 01/24/2021 0000  ? HDL 38 (L) 11/07/2020 0735  ? CHOLHDL 3.4 11/07/2020 0735  ? CHOLHDL 3.5 04/21/2019 0855  ? VLDL 28 04/21/2019 0855  ? [place] 59 01/24/2021 0000  ? [place] 68 11/07/2020 0735  ? ?  ?Other studies Reviewed: ?Additional  studies/ records that were reviewed today with results demonstrating: labs reviewed. ? ? ?ASSESSMENT AND PLAN: ? ?Syncope: Occurred in the setting of being dehydrated.  Plan for echocardiogram to make sure there is no significant structural heart disease. ?Type 2 DM: Whole food, plant based diet.  Lots of fiber, leafy greens.  Avoid processed foods.  A1C down to 8.4 from 13.2. ?Morbid obesity: She drink a lot of water. ?Hyperlipidemia/aortic atherosclerosis: LDL 59. Continue rosuvastatin.  ?HTN: The current medical regimen is effective;  continue present plan and medications. ? ? ?Current medicines are reviewed at length with the patient today.  The patient concerns regarding her medicines were addressed. ? ?The following changes have been made:  No change ? ?Labs/ tests ordered today include: echo ?No orders of the defined types were placed in this encounter. ? ? ?Recommend 150 minutes/week of aerobic exercise ?Low fat, low carb, high fiber diet recommended ? ?Disposition:   FU in as needed, based on echo ? ? ?Signed, ?Larae Grooms, MD  ?08/12/2021 11:22 AM    ?[facility] ?[address], [place], [place]  [address] ?Phone: [phone]; Fax: [phone]  ? ?

## 2021-08-15 DIAGNOSIS — J329 Chronic sinusitis, unspecified: Secondary | ICD-10-CM | POA: Diagnosis not present

## 2021-08-15 DIAGNOSIS — E1142 Type 2 diabetes mellitus with diabetic polyneuropathy: Secondary | ICD-10-CM | POA: Diagnosis not present

## 2021-08-15 DIAGNOSIS — J4530 Mild persistent asthma, uncomplicated: Secondary | ICD-10-CM | POA: Diagnosis not present

## 2021-08-15 DIAGNOSIS — J8489 Other specified interstitial pulmonary diseases: Secondary | ICD-10-CM | POA: Diagnosis not present

## 2021-08-20 ENCOUNTER — Other Ambulatory Visit: Payer: Self-pay

## 2021-08-20 ENCOUNTER — Ambulatory Visit (HOSPITAL_COMMUNITY): Payer: BC Managed Care – PPO | Attending: Cardiovascular Disease

## 2021-08-20 DIAGNOSIS — R55 Syncope and collapse: Secondary | ICD-10-CM | POA: Diagnosis not present

## 2021-08-20 LAB — ECHOCARDIOGRAM COMPLETE
Area-P 1/2: 3.06 cm2
S' Lateral: 2.5 cm

## 2021-08-26 ENCOUNTER — Telehealth: Payer: Self-pay | Admitting: *Deleted

## 2021-08-26 NOTE — Telephone Encounter (Signed)
-----   Message from Jettie Booze, MD sent at 08/26/2021  2:45 PM EDT ----- ?Thanks [employer].   ? ?Fraser Din, ?OK to inform that patient that heart fundction is ok and hydration is important.  If she has recurrent sx, plan for Zio.  ? ?JV ?----- Message ----- ?From: Werner Lean, MD ?Sent: 08/26/2021  10:36 AM EDT ?To: Jettie Booze, MD, # ? ?I think that sounds reasonable.  ?Normal LV thickness, no significant intracavitary gradient < 30 mm Hg. ?If she has recurrent symptoms I would be happy to get a ZioPatch and see if in follow up. ? ?Thanks, ?MAC ?----- Message ----- ?From: Jettie Booze, MD ?Sent: 08/23/2021   8:28 AM EDT ?To: Thompson Grayer, RN, # ? ?Normal LV function.  Mild intracavitary gradient.  Normal valvular function.  Given that her presenting symptom was syncope, will get an opinion from Dr. Glenford Bayley as to whether or not further medication therapy is needed.  I suspect her syncope was more related to dehydration.  She needs to stay well-hydrated.  Given her LV anatomy, she would be more prone to issues with hypovolemia. ? ?Thanks Mahesh. ? ? ?

## 2021-08-26 NOTE — Telephone Encounter (Signed)
Patient notified.  She will follow up as needed but let us know if she develops symptoms again ?

## 2021-11-07 ENCOUNTER — Ambulatory Visit: Payer: BC Managed Care – PPO | Admitting: "Endocrinology

## 2021-11-14 ENCOUNTER — Other Ambulatory Visit: Payer: Self-pay | Admitting: "Endocrinology

## 2021-12-21 ENCOUNTER — Other Ambulatory Visit: Payer: Self-pay | Admitting: "Endocrinology

## 2021-12-23 ENCOUNTER — Encounter: Payer: Self-pay | Admitting: "Endocrinology

## 2021-12-23 ENCOUNTER — Ambulatory Visit (INDEPENDENT_AMBULATORY_CARE_PROVIDER_SITE_OTHER): Payer: BC Managed Care – PPO | Admitting: "Endocrinology

## 2021-12-23 VITALS — BP 142/86 | HR 92 | Ht 61.0 in | Wt 223.2 lb

## 2021-12-23 DIAGNOSIS — E1165 Type 2 diabetes mellitus with hyperglycemia: Secondary | ICD-10-CM | POA: Diagnosis not present

## 2021-12-23 DIAGNOSIS — I10 Essential (primary) hypertension: Secondary | ICD-10-CM

## 2021-12-23 DIAGNOSIS — Z91199 Patient's noncompliance with other medical treatment and regimen due to unspecified reason: Secondary | ICD-10-CM

## 2021-12-23 DIAGNOSIS — E782 Mixed hyperlipidemia: Secondary | ICD-10-CM | POA: Diagnosis not present

## 2021-12-23 MED ORDER — FREESTYLE LIBRE 2 SENSOR MISC
1.0000 | 3 refills | Status: DC
Start: 2021-12-23 — End: 2022-03-31

## 2021-12-23 MED ORDER — FREESTYLE LIBRE 2 READER DEVI: 0 refills | Status: DC

## 2021-12-23 NOTE — Progress Notes (Signed)
12/23/2021, 6:45 PM   Endocrinology follow-up note   Subjective:    Patient ID: [name], female    DOB: November 19, 1960.  [name] is being seen in follow-up in the management of her currently uncontrolled type 2 diabetes, hypertension. [id number], Brittney Baxter, MD.   Past Medical History:  Diagnosis Date   Allergy    Asthma    COPD (chronic obstructive pulmonary disease) ([place])    Diabetes mellitus without complication ([place])    Hypertension    Morbid obesity due to excess calories ([place])    Umbilical hernia     Past Surgical History:  Procedure Laterality Date   CARPAL TUNNEL RELEASE Right    CHOLECYSTECTOMY     COLONOSCOPY N/A 01/12/2015   Procedure: COLONOSCOPY;  Surgeon: Danie Binder, MD;  Location: [facility];  Service: Endoscopy;  Laterality: N/A;  0830   HYSTEROSCOPY WITH D & C N/A 04/01/2013   Procedure: DILATATION AND CURETTAGE /HYSTEROSCOPY;  Surgeon: Florian Buff, MD;  Location: [facility];  Service: Gynecology;  Laterality: N/A;   NASAL POLYP SURGERY      Social History   Socioeconomic History   Marital status: Married    Spouse name: Not on file   Number of children: 2   Years of education: Not on file   Highest education level: Not on file  Occupational History   Not on file  Tobacco Use   Smoking status: Former    Packs/day: 0.50    Years: 20.00    Total pack years: 10.00    Types: Cigarettes    Quit date: 03/25/1997    Years since quitting: 24.7   Smokeless tobacco: Never  Vaping Use   Vaping Use: Never used  Substance and Sexual Activity   Alcohol use: Yes    Alcohol/week: 0.0 standard drinks of alcohol    Comment: Ocassional   Drug use: No   Sexual activity: Yes    Birth control/protection: None, Post-menopausal  Other Topics Concern   Not on file  Social History Narrative   Not on file   Social Determinants of Health   Financial Resource Strain:  Not on file  Food Insecurity: Not on file  Transportation Needs: Not on file  Physical Activity: Not on file  Stress: Not on file  Social Connections: Not on file    Family History  Problem Relation Age of Onset   Diabetes Mother    Kidney disease Mother    Other Mother        renal failure   Hypertension Mother    Cancer Mother        ?uterine cancer    Diabetes Father    Cancer Father        pancreatic   Diabetes Sister    Kidney disease Sister    Diabetes Maternal Grandmother    Asthma Maternal Grandmother    COPD Maternal Grandmother        bronchitis   Hypertension Maternal Grandmother    Thyroid disease Maternal Grandmother    Hypertension Daughter    Colon cancer Neg Hx     Outpatient Encounter Medications as of 12/23/2021  Medication  Sig   Continuous Blood Gluc Receiver (FREESTYLE LIBRE 2 READER) DEVI As directed   Continuous Blood Gluc Sensor (FREESTYLE LIBRE 2 SENSOR) MISC 1 Piece by Does not apply route every 14 (fourteen) days.   acetaZOLAMIDE (DIAMOX) 250 MG tablet Take 250 mg by mouth 2 (two) times daily.   amLODipine (NORVASC) 5 MG tablet Take 1 tablet by mouth daily.   aspirin 81 MG EC tablet Take 1 tablet by mouth daily.   B-D UF III MINI PEN NEEDLES 31G X 5 MM MISC USE ONCE DAILY AS DIRECTED   diclofenac sodium (VOLTAREN) 1 % GEL 4 (four) times daily as needed.   fluticasone (FLONASE) 50 MCG/ACT nasal spray Place 2 sprays into both nostrils daily.   Fluticasone-Salmeterol (ADVAIR) 250-50 MCG/DOSE AEPB Inhale 1 puff into the lungs 2 (two) times daily.    gabapentin (NEURONTIN) 100 MG capsule Take 1 capsule by mouth 2 (two) times daily.   glipiZIDE (GLUCOTROL XL) 5 MG 24 hr tablet TAKE 1 TABLET BY MOUTH EVERY DAY WITH BREAKFAST   Insulin Glargine (TOUJEO MAX SOLOSTAR [place]) Inject 80 Units into the skin at bedtime.   ipratropium (ATROVENT) 0.06 % nasal spray Place 2 sprays into both nostrils 2 (two) times daily.   Ipratropium-Albuterol (COMBIVENT RESPIMAT  IN) Inhale 1 puff into the lungs daily as needed (shortness of breath).   ipratropium-albuterol (DUONEB) 0.5-2.5 (3) MG/3ML SOLN Take 3 mLs by nebulization every 4 (four) hours as needed (Shortness of Breath).    losartan-hydrochlorothiazide (HYZAAR) 100-25 MG tablet Take 1 tablet by mouth daily.   metFORMIN (GLUCOPHAGE) 500 MG tablet TAKE 1 TABLET BY MOUTH 2 TIMES DAILY WITH A MEAL.   Multiple Vitamin (MULTIVITAMIN WITH MINERALS) TABS tablet Take 1 tablet by mouth daily.   rosuvastatin (CRESTOR) 20 MG tablet Take 0.5 tablets (10 mg total) by mouth daily.   traMADol (ULTRAM) 50 MG tablet Take 50 mg by mouth 2 (two) times daily.   [DISCONTINUED] AMOXICILLIN PO Take 1 tablet by mouth 2 (two) times daily.   [DISCONTINUED] calcium carbonate (OS-CAL - DOSED IN MG OF ELEMENTAL CALCIUM) 1250 (500 Ca) MG tablet Take 1 tablet by mouth.   [DISCONTINUED] guaiFENesin-dextromethorphan (ROBITUSSIN DM) 100-10 MG/5ML syrup Take 5 mLs by mouth every 4 (four) hours as needed for cough.   No facility-administered encounter medications on file as of 12/23/2021.    ALLERGIES: No Known Allergies  VACCINATION STATUS: Immunization History  Administered Date(s) Administered   Influenza-Unspecified 06/09/2010, 06/09/2013   Pneumococcal Polysaccharide-23 08/05/2021    Diabetes She presents for her follow-up diabetic visit. She has type 2 diabetes mellitus. Onset time: She was diagnosed at approximate age of 41 years. Her disease course has been worsening. There are no hypoglycemic associated symptoms. Pertinent negatives for hypoglycemia include no confusion, headaches, pallor or seizures. Associated symptoms include polydipsia and polyuria. Pertinent negatives for diabetes include no chest pain, no fatigue and no polyphagia. There are no hypoglycemic complications. Symptoms are worsening. There are no diabetic complications. Risk factors for coronary artery disease include diabetes mellitus, dyslipidemia, family  history, obesity, tobacco exposure, sedentary lifestyle, post-menopausal and hypertension. Current diabetic treatment includes insulin injections (She is currently on Basaglar 60 units twice daily,  Metformin 1000 mg p.o. twice daily, could not afford Januvia any longer, she remains on glipizide 5 mg XL p.o. daily breakfast.  ). Her weight is fluctuating minimally. She is following a generally unhealthy diet. When asked about meal planning, she reported none. She has not had a previous visit with  a dietitian. She rarely participates in exercise. (She presents with a meter which shows severely inadequate monitoring, 5 readings in the last 90 days.  Her point-of-care A1c is 12.2%, increasing from 8.4%.  She denies hypoglycemia.      ) An ACE inhibitor/angiotensin II receptor blocker is being taken.  Hyperlipidemia This is a chronic problem. The current episode started more than 1 year ago. The problem is controlled. Exacerbating diseases include diabetes and obesity. Pertinent negatives include no chest pain, myalgias or shortness of breath. Current antihyperlipidemic treatment includes statins. Risk factors for coronary artery disease include diabetes mellitus, dyslipidemia, family history, hypertension, obesity, a sedentary lifestyle and post-menopausal.  Hypertension This is a chronic problem. The current episode started more than 1 year ago. Pertinent negatives include no chest pain, headaches, palpitations or shortness of breath. Risk factors for coronary artery disease include diabetes mellitus, dyslipidemia, obesity, post-menopausal state, smoking/tobacco exposure, sedentary lifestyle and family history. Past treatments include ACE inhibitors.    Review of systems  Constitutional: + Minimally fluctuating body weight,  current  Body mass index is 42.17 kg/m. , no fatigue, no subjective hyperthermia, no subjective hypothermia    Objective:    BP (!) 142/86   Pulse 92   Ht '5\' 1"'$  (1.549 m)    Wt 223 lb 3.2 oz (101.2 kg)   LMP 02/25/2019   BMI 42.17 kg/m   Wt Readings from Last 3 Encounters:  12/23/21 223 lb 3.2 oz (101.2 kg)  08/12/21 213 lb 6.4 oz (96.8 kg)  08/05/21 220 lb 3.2 oz (99.9 kg)    Physical Exam- Limited  Constitutional:  Body mass index is 42.17 kg/m. , not in acute distress, normal state of mind    CMP     Component Value Date/Time   NA 138 08/05/2021 0531   NA 138 01/24/2021 0000   K 4.1 08/05/2021 0531   CL 105 08/05/2021 0531   CO2 23 08/05/2021 0531   GLUCOSE 378 (H) 08/05/2021 0531   BUN 13 08/05/2021 0531   BUN 14 01/24/2021 0000   CREATININE 0.90 08/05/2021 0531   CALCIUM 8.9 08/05/2021 0531   PROT 6.8 08/05/2021 0531   PROT 6.9 11/07/2020 0735   ALBUMIN 3.2 (L) 08/05/2021 0531   ALBUMIN 4.2 11/07/2020 0735   AST 23 08/05/2021 0531   ALT 26 08/05/2021 0531   ALKPHOS 50 08/05/2021 0531   BILITOT 0.1 (L) 08/05/2021 0531   BILITOT <0.2 11/07/2020 0735   GFRNONAA >60 08/05/2021 0531   GFRAA >60 04/21/2019 0854     Diabetic Labs (most recent): Lab Results  Component Value Date   HGBA1C 8.4 (H) 08/04/2021   HGBA1C 13.5 (H) 05/31/2021   HGBA1C 11.3 04/26/2021   MICROALBUR 122.3 (H) 04/21/2019     Lipid Panel ( most recent) Lipid Panel     Component Value Date/Time   CHOL 113 01/24/2021 0000   CHOL 128 11/07/2020 0735   TRIG 102 01/24/2021 0000   HDL 35 01/24/2021 0000   HDL 38 (L) 11/07/2020 0735   CHOLHDL 3.4 11/07/2020 0735   CHOLHDL 3.5 04/21/2019 0855   VLDL 28 04/21/2019 0855   LDLCALC 59 01/24/2021 0000   LDLCALC 68 11/07/2020 0735   LABVLDL 22 11/07/2020 0735      Lab Results  Component Value Date   TSH 1.210 11/07/2020   TSH 2.110 01/16/2020   TSH 3.399 03/07/2013   FREET4 1.13 11/07/2020   FREET4 1.06 01/16/2020     Assessment & Plan:  1. Uncontrolled type 2 diabetes mellitus with hyperglycemia ([place])  - [name] has currently uncontrolled symptomatic type 2 DM since 61 years of age.  She  presents with a meter which shows severely inadequate monitoring, 5 readings in the last 90 days.  Her point-of-care A1c is 12.2%, increasing from 8.4%.  She denies hypoglycemia.    - I had a long discussion with her about the progressive nature of diabetes and the pathology behind its complications. -her diabetes is complicated by obesity, non- engagement for management plan,  history of smoking and she remains at a high risk for more acute and chronic complications which include CAD, CVA, CKD, retinopathy, and neuropathy. These are all discussed in detail with her.  - I have counseled her on diet  and weight management  by adopting a carbohydrate restricted/protein rich diet. Patient is encouraged to switch to  unprocessed or minimally processed     complex starch and increased protein intake (animal or plant source), fruits, and vegetables. -  she is advised to stick to a routine mealtimes to eat 3 meals  a day and avoid unnecessary snacks ( to snack only to correct hypoglycemia).   - she acknowledges that there is a room for improvement in her food and drink choices. - Suggestion is made for her to avoid simple carbohydrates  from her diet including Cakes, Sweet Desserts, Ice Cream, Soda (diet and regular), Sweet Tea, Candies, Chips, Cookies, Store Bought Juices, Alcohol , Artificial Sweeteners,  Coffee Creamer, and "Sugar-free" Products, Lemonade. This will help patient to have more stable blood glucose profile and potentially avoid unintended weight gain.  The following Lifestyle Medicine recommendations according to [place]  [facility]) were discussed and and offered to patient and she  agrees to start the journey:  A. Whole Foods, Plant-Based Nutrition comprising of fruits and vegetables, plant-based proteins, whole-grain carbohydrates was discussed in detail with the patient.   A list for source of those nutrients were also provided to the patient.  Patient will use  only water or unsweetened tea for hydration. B.  The need to stay away from risky substances including alcohol, smoking; obtaining 7 to 9 hours of restorative sleep, at least 150 minutes of moderate intensity exercise weekly, the importance of healthy social connections,  and stress management techniques were discussed. C.  A full color page of  Calorie density of various food groups per pound showing examples of each food groups was provided to the patient.   - she has been scheduled with Jearld Fenton, RDN, CDE for diabetes education.  - I have approached her with the following individualized plan to manage  her diabetes and patient agrees:    -[name] remains alarmingly noncompliant.  in light of her presentation with severely uncontrolled hyperglycemic profile evident from her A1c of 12.2%, she is approached for better engagement to monitor blood glucose to optimize her insulin.  I discussed and prescribed a CGM device for her.  In the meantime, she is advised to use her meter 4 times a day-before meals and at bedtime and return in 10 days with her meter and logs.    Her Toujeo will be kept at 80 units nightly, patient is advised to call clinic for blood glucose readings above 200 or less than 70 mg per DL.   - she is advised to continue Metformin 500 mg p.o. twice daily, glipizide 5 mg XL p.o. daily at breakfast.    - Specific  targets for  A1c;  LDL, HDL,  and Triglycerides were discussed with the patient.  2) Blood Pressure /Hypertension: Her blood pressure is still not controlled to target.  She has amlodipine 5 mg p.o. daily, Hyzaar, losartan/HCTZ.   The above described WF PB diet will help with hypertension and hyperlipidemia as well.  The most likely reason for uncontrolled hypertension is her nonadherence to medications as well as lifestyle recommendations.   3) Lipids/Hyperlipidemia:   Review of her recent lipid panel showed  controlled  LDL at 59.  She is advised to continue  Crestor 20 mg p.o. daily at bedtime.      Side effects and precautions discussed with her.    4)  Weight/Diet: Her BMI is 42.17---   clearly complicating her diabetes care.   she is  a candidate for modest weight loss. I discussed with her the fact that loss of 5 - 10% of her  current body weight will have the most impact on her diabetes management.  Exercise, and detailed carbohydrates information provided  -  detailed on discharge instructions.  5) Chronic Care/Health Maintenance:  -she  is on ACEI/ARB and Statin medications and  is encouraged to initiate and continue to follow up with Ophthalmology, Dentist,  Podiatrist at least yearly or according to recommendations, and advised to  stay away from smoking. I have recommended yearly flu vaccine and pneumonia vaccine at least every 5 years; moderate intensity exercise for up to 150 minutes weekly; and  sleep for at least 7 hours a day.  Her ABI was recently normal on April 26, 2020.  - she is  advised to maintain close follow up with Carrolyn Meiers, MD for primary care needs, as well as her other providers for optimal and coordinated care.    I spent 41 minutes in the care of the patient today including review of labs from [facility], Lipids, Thyroid Function, Hematology (current and previous including abstractions from other facilities); face-to-face time discussing  her blood glucose readings/logs, discussing hypoglycemia and hyperglycemia episodes and symptoms, medications doses, her options of short and long term treatment based on the latest standards of care / guidelines;  discussion about incorporating lifestyle medicine;  and documenting the encounter. Risk reduction counseling performed per USPSTF guidelines to reduce obesity and cardiovascular risk factors.     Please refer to Patient Instructions for Blood Glucose Monitoring and Insulin/Medications Dosing Guide"  in media tab for additional information. Please  also refer to "  Patient Self Inventory" in the Media  tab for reviewed elements of pertinent patient history.  [name] participated in the discussions, expressed understanding, and voiced agreement with the above plans.  All questions were answered to her satisfaction. she is encouraged to contact clinic should she have any questions or concerns prior to her return visit.    Follow up plan: - Return in about 10 days (around 01/02/2022) for F/U with Meter/CGM [facility] Only - no Labs.  Glade Lloyd, MD [facility] [address] [place], [place] [address] Phone: [phone]  Fax: [phone]    12/23/2021, 6:45 PM  This note was partially dictated with voice recognition software. Similar sounding words can be transcribed inadequately or may not  be corrected upon review.

## 2021-12-23 NOTE — Patient Instructions (Signed)

## 2022-01-06 ENCOUNTER — Ambulatory Visit (INDEPENDENT_AMBULATORY_CARE_PROVIDER_SITE_OTHER): Payer: BC Managed Care – PPO | Admitting: "Endocrinology

## 2022-01-06 ENCOUNTER — Encounter: Payer: Self-pay | Admitting: "Endocrinology

## 2022-01-06 VITALS — BP 124/82 | HR 88 | Ht 61.0 in | Wt 226.4 lb

## 2022-01-06 DIAGNOSIS — E782 Mixed hyperlipidemia: Secondary | ICD-10-CM

## 2022-01-06 DIAGNOSIS — I10 Essential (primary) hypertension: Secondary | ICD-10-CM

## 2022-01-06 DIAGNOSIS — E1165 Type 2 diabetes mellitus with hyperglycemia: Secondary | ICD-10-CM | POA: Diagnosis not present

## 2022-01-06 LAB — POCT GLYCOSYLATED HEMOGLOBIN (HGB A1C): HbA1c, POC (controlled diabetic range): 11.3 % — AB (ref 0.0–7.0)

## 2022-01-06 NOTE — Progress Notes (Signed)
01/06/2022, 6:52 PM   Endocrinology follow-up note   Subjective:    Patient ID: [name], female    DOB: 08/23/1960.  [name] is being seen in follow-up in the management of her currently uncontrolled type 2 diabetes, hypertension. OMV:[facility], Brittney Baxter, MD.   Past Medical History:  Diagnosis Date   Allergy    Asthma    COPD (chronic obstructive pulmonary disease) ([place])    Diabetes mellitus without complication ([place])    Hypertension    Morbid obesity due to excess calories ([place])    Umbilical hernia     Past Surgical History:  Procedure Laterality Date   CARPAL TUNNEL RELEASE Right    CHOLECYSTECTOMY     COLONOSCOPY N/A 01/12/2015   Procedure: COLONOSCOPY;  Surgeon: Danie Binder, MD;  Location: [facility];  Service: Endoscopy;  Laterality: N/A;  0830   HYSTEROSCOPY WITH D & C N/A 04/01/2013   Procedure: DILATATION AND CURETTAGE /HYSTEROSCOPY;  Surgeon: Florian Buff, MD;  Location: [facility];  Service: Gynecology;  Laterality: N/A;   NASAL POLYP SURGERY      Social History   Socioeconomic History   Marital status: Married    Spouse name: Not on file   Number of children: 2   Years of education: Not on file   Highest education level: Not on file  Occupational History   Not on file  Tobacco Use   Smoking status: Former    Packs/day: 0.50    Years: 20.00    Total pack years: 10.00    Types: Cigarettes    Quit date: 03/25/1997    Years since quitting: 24.8   Smokeless tobacco: Never  Vaping Use   Vaping Use: Never used  Substance and Sexual Activity   Alcohol use: Yes    Alcohol/week: 0.0 standard drinks of alcohol    Comment: Ocassional   Drug use: No   Sexual activity: Yes    Birth control/protection: None, Post-menopausal  Other Topics Concern   Not on file  Social History Narrative   Not on file   Social Determinants of Health   Financial Resource Strain:  Not on file  Food Insecurity: Not on file  Transportation Needs: Not on file  Physical Activity: Not on file  Stress: Not on file  Social Connections: Not on file    Family History  Problem Relation Age of Onset   Diabetes Mother    Kidney disease Mother    Other Mother        renal failure   Hypertension Mother    Cancer Mother        ?uterine cancer    Diabetes Father    Cancer Father        pancreatic   Diabetes Sister    Kidney disease Sister    Diabetes Maternal Grandmother    Asthma Maternal Grandmother    COPD Maternal Grandmother        bronchitis   Hypertension Maternal Grandmother    Thyroid disease Maternal Grandmother    Hypertension Daughter    Colon cancer Neg Hx     Outpatient Encounter Medications as of 01/06/2022  Medication  Sig   acetaZOLAMIDE (DIAMOX) 250 MG tablet Take 250 mg by mouth 3 (three) times daily.   amLODipine (NORVASC) 5 MG tablet Take 1 tablet by mouth daily.   aspirin 81 MG EC tablet Take 1 tablet by mouth daily.   B-D UF III MINI PEN NEEDLES 31G X 5 MM MISC USE ONCE DAILY AS DIRECTED   Continuous Blood Gluc Receiver (FREESTYLE LIBRE 2 READER) DEVI As directed   Continuous Blood Gluc Sensor (FREESTYLE LIBRE 2 SENSOR) MISC 1 Piece by Does not apply route every 14 (fourteen) days.   diclofenac sodium (VOLTAREN) 1 % GEL 4 (four) times daily as needed.   fluticasone (FLONASE) 50 MCG/ACT nasal spray Place 2 sprays into both nostrils daily.   Fluticasone-Salmeterol (ADVAIR) 250-50 MCG/DOSE AEPB Inhale 1 puff into the lungs 2 (two) times daily.    gabapentin (NEURONTIN) 100 MG capsule Take 1 capsule by mouth 2 (two) times daily.   glipiZIDE (GLUCOTROL XL) 5 MG 24 hr tablet TAKE 1 TABLET BY MOUTH EVERY DAY WITH BREAKFAST   Insulin Glargine (TOUJEO MAX SOLOSTAR [place]) Inject 90 Units into the skin at bedtime.   ipratropium (ATROVENT) 0.06 % nasal spray Place 2 sprays into both nostrils 2 (two) times daily.   Ipratropium-Albuterol (COMBIVENT RESPIMAT  IN) Inhale 1 puff into the lungs daily as needed (shortness of breath).   ipratropium-albuterol (DUONEB) 0.5-2.5 (3) MG/3ML SOLN Take 3 mLs by nebulization every 4 (four) hours as needed (Shortness of Breath).    losartan-hydrochlorothiazide (HYZAAR) 100-25 MG tablet Take 1 tablet by mouth daily.   metFORMIN (GLUCOPHAGE) 500 MG tablet TAKE 1 TABLET BY MOUTH 2 TIMES DAILY WITH A MEAL.   Multiple Vitamin (MULTIVITAMIN WITH MINERALS) TABS tablet Take 1 tablet by mouth daily.   rosuvastatin (CRESTOR) 20 MG tablet Take 0.5 tablets (10 mg total) by mouth daily.   traMADol (ULTRAM) 50 MG tablet Take 50 mg by mouth 2 (two) times daily.   No facility-administered encounter medications on file as of 01/06/2022.    ALLERGIES: No Known Allergies  VACCINATION STATUS: Immunization History  Administered Date(s) Administered   Influenza-Unspecified 06/09/2010, 06/09/2013   Pneumococcal Polysaccharide-23 08/05/2021    Diabetes She presents for her follow-up diabetic visit. She has type 2 diabetes mellitus. Onset time: She was diagnosed at approximate age of 61 years. Her disease course has been improving. There are no hypoglycemic associated symptoms. Pertinent negatives for hypoglycemia include no confusion, headaches, pallor or seizures. Pertinent negatives for diabetes include no chest pain, no fatigue, no polydipsia, no polyphagia and no polyuria. There are no hypoglycemic complications. Symptoms are improving. There are no diabetic complications. Risk factors for coronary artery disease include diabetes mellitus, dyslipidemia, family history, obesity, tobacco exposure, sedentary lifestyle, post-menopausal and hypertension. Current diabetic treatment includes insulin injections (She is currently on Basaglar 60 units twice daily,  Metformin 1000 mg p.o. twice daily, could not afford Januvia any longer, she remains on glipizide 5 mg XL p.o. daily breakfast.  ). Her weight is fluctuating minimally. She is  following a generally unhealthy diet. When asked about meal planning, she reported none. She has not had a previous visit with a dietitian. She rarely participates in exercise. Her home blood glucose trend is decreasing steadily. Her breakfast blood glucose range is generally 180-200 mg/dl. Her lunch blood glucose range is generally 180-200 mg/dl. Her dinner blood glucose range is generally 180-200 mg/dl. Her bedtime blood glucose range is generally 180-200 mg/dl. Her overall blood glucose range is 180-200 mg/dl. (She received  her freestyle libre CGM device.  She shows improvement.  She presents with AGP report showing 41% time range, 49% with level 1 hyperglycemia, 10% level 2 hyperglycemia.  She has no hypoglycemia.  Her average blood glucose is 195 for the last 14 days.  Her point-of-care A1c is 11.3%, improved from 12.2% during her last visit.     ) An ACE inhibitor/angiotensin II receptor blocker is being taken.  Hyperlipidemia This is a chronic problem. The current episode started more than 1 year ago. The problem is controlled. Exacerbating diseases include diabetes and obesity. Pertinent negatives include no chest pain, myalgias or shortness of breath. Current antihyperlipidemic treatment includes statins. Risk factors for coronary artery disease include diabetes mellitus, dyslipidemia, family history, hypertension, obesity, a sedentary lifestyle and post-menopausal.  Hypertension This is a chronic problem. The current episode started more than 1 year ago. Pertinent negatives include no chest pain, headaches, palpitations or shortness of breath. Risk factors for coronary artery disease include diabetes mellitus, dyslipidemia, obesity, post-menopausal state, smoking/tobacco exposure, sedentary lifestyle and family history. Past treatments include ACE inhibitors.    Review of systems  Constitutional: + Minimally fluctuating body weight,  current  Body mass index is 42.78 kg/m. , no fatigue, no  subjective hyperthermia, no subjective hypothermia    Objective:    BP 124/82   Pulse 88   Ht '5\' 1"'$  (1.549 m)   Wt 226 lb 6.4 oz (102.7 kg)   LMP 02/25/2019   BMI 42.78 kg/m   Wt Readings from Last 3 Encounters:  01/06/22 226 lb 6.4 oz (102.7 kg)  12/23/21 223 lb 3.2 oz (101.2 kg)  08/12/21 213 lb 6.4 oz (96.8 kg)    Physical Exam- Limited  Constitutional:  Body mass index is 42.78 kg/m. , not in acute distress, normal state of mind    CMP     Component Value Date/Time   NA 138 08/05/2021 0531   NA 138 01/24/2021 0000   K 4.1 08/05/2021 0531   CL 105 08/05/2021 0531   CO2 23 08/05/2021 0531   GLUCOSE 378 (H) 08/05/2021 0531   BUN 13 08/05/2021 0531   BUN 14 01/24/2021 0000   CREATININE 0.90 08/05/2021 0531   CALCIUM 8.9 08/05/2021 0531   PROT 6.8 08/05/2021 0531   PROT 6.9 11/07/2020 0735   ALBUMIN 3.2 (L) 08/05/2021 0531   ALBUMIN 4.2 11/07/2020 0735   AST 23 08/05/2021 0531   ALT 26 08/05/2021 0531   ALKPHOS 50 08/05/2021 0531   BILITOT 0.1 (L) 08/05/2021 0531   BILITOT <0.2 11/07/2020 0735   GFRNONAA >60 08/05/2021 0531   GFRAA >60 04/21/2019 0854     Diabetic Labs (most recent): Lab Results  Component Value Date   HGBA1C 11.3 (A) 01/06/2022   HGBA1C 8.4 (H) 08/04/2021   HGBA1C 13.5 (H) 05/31/2021   MICROALBUR 122.3 (H) 04/21/2019     Lipid Panel ( most recent) Lipid Panel     Component Value Date/Time   CHOL 113 01/24/2021 0000   CHOL 128 11/07/2020 0735   TRIG 102 01/24/2021 0000   HDL 35 01/24/2021 0000   HDL 38 (L) 11/07/2020 0735   CHOLHDL 3.4 11/07/2020 0735   CHOLHDL 3.5 04/21/2019 0855   VLDL 28 04/21/2019 0855   LDLCALC 59 01/24/2021 0000   LDLCALC 68 11/07/2020 0735   LABVLDL 22 11/07/2020 0735      Lab Results  Component Value Date   TSH 1.210 11/07/2020   TSH 2.110 01/16/2020   TSH 3.399  03/07/2013   FREET4 1.13 11/07/2020   FREET4 1.06 01/16/2020     Assessment & Plan:   1. Uncontrolled type 2 diabetes mellitus  with hyperglycemia ([place])  - [name] has currently uncontrolled symptomatic type 2 DM since 61 years of age.  She received her freestyle libre CGM device.  She shows improvement.  She presents with AGP report showing 41% time range, 49% with level 1 hyperglycemia, 10% level 2 hyperglycemia.  She has no hypoglycemia.  Her average blood glucose is 195 for the last 14 days.  Her point-of-care A1c is 11.3%, improved from 12.2% during her last visit.    - I had a long discussion with her about the progressive nature of diabetes and the pathology behind its complications. -her diabetes is complicated by obesity, non- engagement for management plan,  history of smoking and she remains at a high risk for more acute and chronic complications which include CAD, CVA, CKD, retinopathy, and neuropathy. These are all discussed in detail with her.  - I have counseled her on diet  and weight management  by adopting a carbohydrate restricted/protein rich diet. Patient is encouraged to switch to  unprocessed or minimally processed     complex starch and increased protein intake (animal or plant source), fruits, and vegetables. -  she is advised to stick to a routine mealtimes to eat 3 meals  a day and avoid unnecessary snacks ( to snack only to correct hypoglycemia).   - she acknowledges that there is a room for improvement in her food and drink choices. - Suggestion is made for her to avoid simple carbohydrates  from her diet including Cakes, Sweet Desserts, Ice Cream, Soda (diet and regular), Sweet Tea, Candies, Chips, Cookies, Store Bought Juices, Alcohol , Artificial Sweeteners,  Coffee Creamer, and "Sugar-free" Products, Lemonade. This will help patient to have more stable blood glucose profile and potentially avoid unintended weight gain.  The following Lifestyle Medicine recommendations according to [facility]) were discussed and and offered to patient and she  agrees to  start the journey:  A. Whole Foods, Plant-Based Nutrition comprising of fruits and vegetables, plant-based proteins, whole-grain carbohydrates was discussed in detail with the patient.   A list for source of those nutrients were also provided to the patient.  Patient will use only water or unsweetened tea for hydration. B.  The need to stay away from risky substances including alcohol, smoking; obtaining 7 to 9 hours of restorative sleep, at least 150 minutes of moderate intensity exercise weekly, the importance of healthy social connections,  and stress management techniques were discussed. C.  A full color page of  Calorie density of various food groups per pound showing examples of each food groups was provided to the patient.   - she has been scheduled with Jearld Fenton, RDN, CDE for diabetes education.  - I have approached her with the following individualized plan to manage  her diabetes and patient agrees:    -[name] has engaged better.  She has a CGM now.   In light of her presentation with improved glycemic profile, she will not need prandial insulin for now.  She is advised to increase her Toujeo to 90 units nightly, associated with monitoring of blood glucose continuously using her CGM.   -  patient is advised to call clinic for blood glucose readings above 200 or less than 70 mg per DL.   - she is advised to continue  Metformin 500 mg p.o. twice daily, glipizide 5 mg XL p.o. daily at breakfast.    - Specific targets for  A1c;  LDL, HDL,  and Triglycerides were discussed with the patient.  2) Blood Pressure /Hypertension:   Her blood pressure is controlled to target.  She has amlodipine 5 mg p.o. daily, Hyzaar, losartan/HCTZ.   The above described WF PB diet will help with hypertension and hyperlipidemia as well.  The most likely reason for uncontrolled hypertension is her nonadherence to medications as well as lifestyle recommendations.   3) Lipids/Hyperlipidemia:   Review  of her recent lipid panel showed  controlled  LDL at 59.  She is advised to continue Crestor 20 mg p.o. daily at bedtime.    Side effects and precautions discussed with her.    4)  Weight/Diet: Her BMI is 42.78--   clearly complicating her diabetes care.   she is  a candidate for modest weight loss. I discussed with her the fact that loss of 5 - 10% of her  current body weight will have the most impact on her diabetes management.  Exercise, and detailed carbohydrates information provided  -  detailed on discharge instructions.  5) Chronic Care/Health Maintenance:  -she  is on ACEI/ARB and Statin medications and  is encouraged to initiate and continue to follow up with Ophthalmology, Dentist,  Podiatrist at least yearly or according to recommendations, and advised to  stay away from smoking. I have recommended yearly flu vaccine and pneumonia vaccine at least every 5 years; moderate intensity exercise for up to 150 minutes weekly; and  sleep for at least 7 hours a day.  Her ABI was recently normal on April 26, 2020.  - she is  advised to maintain close follow up with Carrolyn Meiers, MD for primary care needs, as well as her other providers for optimal and coordinated care.    I spent 42 minutes in the care of the patient today including review of labs from [facility], Lipids, Thyroid Function, Hematology (current and previous including abstractions from other facilities); face-to-face time discussing  her blood glucose readings/logs, discussing hypoglycemia and hyperglycemia episodes and symptoms, medications doses, her options of short and long term treatment based on the latest standards of care / guidelines;  discussion about incorporating lifestyle medicine;  and documenting the encounter. Risk reduction counseling performed per USPSTF guidelines to reduce obesity and cardiovascular risk factors.     Please refer to Patient Instructions for Blood Glucose Monitoring and Insulin/Medications  Dosing Guide"  in media tab for additional information. Please  also refer to " Patient Self Inventory" in the Media  tab for reviewed elements of pertinent patient history.  [name] participated in the discussions, expressed understanding, and voiced agreement with the above plans.  All questions were answered to her satisfaction. she is encouraged to contact clinic should she have any questions or concerns prior to her return visit.     Follow up plan: - Return in about 3 months (around 04/08/2022) for F/U with Pre-visit Labs, Meter/CGM/Logs, A1c here.  Glade Lloyd, MD [facility] [address] [place], [place] [address] Phone: [phone]  Fax: [phone]    01/06/2022, 6:52 PM  This note was partially dictated with voice recognition software. Similar sounding words can be transcribed inadequately or may not  be corrected upon review.

## 2022-01-06 NOTE — Patient Instructions (Signed)

## 2022-01-22 ENCOUNTER — Other Ambulatory Visit: Payer: Self-pay | Admitting: "Endocrinology

## 2022-01-23 ENCOUNTER — Telehealth: Payer: Self-pay | Admitting: "Endocrinology

## 2022-01-23 NOTE — Telephone Encounter (Signed)
Pt states she ate dinner earlier than she usually does yesterday. She takes glipizide '5mg'$  daily, metformin '500mg'$  bid and toujeo 90 units at bedtime. States she did not take her oral medication today.

## 2022-01-23 NOTE — Telephone Encounter (Signed)
Left a message requesting pt return call to the office. ?

## 2022-01-23 NOTE — Telephone Encounter (Signed)
New message    During the night blood sugar drop to 54 able to get up 74.  Call or come by the office meter not be able to read her blood sugar

## 2022-01-24 ENCOUNTER — Telehealth: Payer: Self-pay | Admitting: "Endocrinology

## 2022-01-24 NOTE — Telephone Encounter (Signed)
Left detailed VM about the reason for her previous call. Let her know what Dr Dorris Fetch had said in his msg.

## 2022-01-24 NOTE — Telephone Encounter (Signed)
New message    Returning call back to the nurse o.k. to leave a voicemail

## 2022-02-04 ENCOUNTER — Other Ambulatory Visit (HOSPITAL_COMMUNITY): Payer: Self-pay | Admitting: Internal Medicine

## 2022-02-04 DIAGNOSIS — E1142 Type 2 diabetes mellitus with diabetic polyneuropathy: Secondary | ICD-10-CM | POA: Diagnosis not present

## 2022-02-04 DIAGNOSIS — Z1231 Encounter for screening mammogram for malignant neoplasm of breast: Secondary | ICD-10-CM

## 2022-02-04 DIAGNOSIS — I10 Essential (primary) hypertension: Secondary | ICD-10-CM | POA: Diagnosis not present

## 2022-02-04 DIAGNOSIS — E1165 Type 2 diabetes mellitus with hyperglycemia: Secondary | ICD-10-CM | POA: Diagnosis not present

## 2022-02-04 DIAGNOSIS — Z0001 Encounter for general adult medical examination with abnormal findings: Secondary | ICD-10-CM | POA: Diagnosis not present

## 2022-02-11 DIAGNOSIS — I10 Essential (primary) hypertension: Secondary | ICD-10-CM | POA: Diagnosis not present

## 2022-02-11 DIAGNOSIS — E1165 Type 2 diabetes mellitus with hyperglycemia: Secondary | ICD-10-CM | POA: Diagnosis not present

## 2022-02-11 DIAGNOSIS — Z0001 Encounter for general adult medical examination with abnormal findings: Secondary | ICD-10-CM | POA: Diagnosis not present

## 2022-02-11 DIAGNOSIS — E559 Vitamin D deficiency, unspecified: Secondary | ICD-10-CM | POA: Diagnosis not present

## 2022-03-03 ENCOUNTER — Telehealth: Payer: Self-pay | Admitting: "Endocrinology

## 2022-03-03 DIAGNOSIS — E1165 Type 2 diabetes mellitus with hyperglycemia: Secondary | ICD-10-CM

## 2022-03-03 NOTE — Telephone Encounter (Signed)
New message  1. Which medications need to be refilled? (please list name of each medication and dose if known) Insulin Glargine (TOUJEO MAX SOLOSTAR [place])  2. Which pharmacy/location (including street and city if local pharmacy) is medication to be sent to?[place] [phone]  3. Do they need a 30 day or 90 day supply? 90 day supply with 3 refill

## 2022-03-04 MED ORDER — TOUJEO MAX SOLOSTAR 300 UNIT/ML ~~LOC~~ SOPN: 90.0000 [IU] | PEN_INJECTOR | Freq: Every day | SUBCUTANEOUS | 0 refills | Status: DC

## 2022-03-04 NOTE — Telephone Encounter (Signed)
Rx refill sent.

## 2022-03-10 ENCOUNTER — Ambulatory Visit (HOSPITAL_COMMUNITY)
Admission: RE | Admit: 2022-03-10 | Discharge: 2022-03-10 | Disposition: A | Discharge status: Home / Self Care | Payer: BC Managed Care – PPO | Source: Ambulatory Visit | Attending: Internal Medicine | Admitting: Internal Medicine

## 2022-03-10 DIAGNOSIS — Z1231 Encounter for screening mammogram for malignant neoplasm of breast: Secondary | ICD-10-CM | POA: Diagnosis not present

## 2022-03-31 ENCOUNTER — Other Ambulatory Visit: Payer: Self-pay | Admitting: "Endocrinology

## 2022-04-01 ENCOUNTER — Other Ambulatory Visit: Payer: Self-pay

## 2022-04-01 DIAGNOSIS — E1165 Type 2 diabetes mellitus with hyperglycemia: Secondary | ICD-10-CM

## 2022-04-01 MED ORDER — TOUJEO MAX SOLOSTAR 300 UNIT/ML ~~LOC~~ SOPN: 90.0000 [IU] | PEN_INJECTOR | Freq: Every day | SUBCUTANEOUS | 0 refills | Status: DC

## 2022-04-01 NOTE — Telephone Encounter (Signed)
Rx refill for Toujeo sent to [place].

## 2022-04-03 ENCOUNTER — Other Ambulatory Visit: Payer: Self-pay | Admitting: "Endocrinology

## 2022-04-03 DIAGNOSIS — G932 Benign intracranial hypertension: Secondary | ICD-10-CM | POA: Diagnosis not present

## 2022-04-03 DIAGNOSIS — G9600 Cerebrospinal fluid leak, unspecified: Secondary | ICD-10-CM | POA: Diagnosis not present

## 2022-04-03 DIAGNOSIS — E1165 Type 2 diabetes mellitus with hyperglycemia: Secondary | ICD-10-CM

## 2022-04-08 ENCOUNTER — Ambulatory Visit: Payer: BC Managed Care – PPO | Admitting: "Endocrinology

## 2022-04-14 ENCOUNTER — Ambulatory Visit: Payer: BC Managed Care – PPO | Admitting: "Endocrinology

## 2022-04-23 ENCOUNTER — Other Ambulatory Visit: Payer: Self-pay | Admitting: "Endocrinology

## 2022-05-05 DIAGNOSIS — N950 Postmenopausal bleeding: Secondary | ICD-10-CM | POA: Diagnosis not present

## 2022-05-05 DIAGNOSIS — R9389 Abnormal findings on diagnostic imaging of other specified body structures: Secondary | ICD-10-CM | POA: Diagnosis not present

## 2022-05-05 DIAGNOSIS — E1165 Type 2 diabetes mellitus with hyperglycemia: Secondary | ICD-10-CM | POA: Diagnosis not present

## 2022-05-05 DIAGNOSIS — I10 Essential (primary) hypertension: Secondary | ICD-10-CM | POA: Diagnosis not present

## 2022-05-05 DIAGNOSIS — J45909 Unspecified asthma, uncomplicated: Secondary | ICD-10-CM | POA: Diagnosis not present

## 2022-05-06 DIAGNOSIS — Z23 Encounter for immunization: Secondary | ICD-10-CM | POA: Diagnosis not present

## 2022-05-06 LAB — LIPID PANEL
Chol/HDL Ratio: 2.6 ratio (ref 0.0–4.4)
Cholesterol, Total: 116 mg/dL (ref 100–199)
HDL: 45 mg/dL (ref >39)
LDL Chol Calc (NIH): 56 mg/dL (ref 0–99)
Triglycerides: 76 mg/dL (ref 0–149)
VLDL Cholesterol Cal: 15 mg/dL (ref 5–40)

## 2022-05-06 LAB — COMPREHENSIVE METABOLIC PANEL
ALT: 21 IU/L (ref 0–32)
AST: 13 IU/L (ref 0–40)
Albumin/Globulin Ratio: 2.2 (ref 1.2–2.2)
Albumin: 4.6 g/dL (ref 3.9–4.9)
Alkaline Phosphatase: 81 IU/L (ref 44–121)
BUN/Creatinine Ratio: 16 (ref 12–28)
BUN: 13 mg/dL (ref 8–27)
Bilirubin Total: <0.2 mg/dL (ref 0.0–1.2)
CO2: 23 mmol/L (ref 20–29)
Calcium: 10.6 mg/dL — ABNORMAL HIGH (ref 8.7–10.3)
Chloride: 104 mmol/L (ref 96–106)
Creatinine, Ser: 0.79 mg/dL (ref 0.57–1.00)
Globulin, Total: 2.1 g/dL (ref 1.5–4.5)
Glucose: 151 mg/dL — ABNORMAL HIGH (ref 70–99)
Potassium: 4.5 mmol/L (ref 3.5–5.2)
Sodium: 141 mmol/L (ref 134–144)
Total Protein: 6.7 g/dL (ref 6.0–8.5)
eGFR: 85 mL/min/{1.73_m2} (ref >59)

## 2022-05-06 LAB — TSH: TSH: 2.11 u[IU]/mL (ref 0.450–4.500)

## 2022-05-06 LAB — T4, FREE: Free T4: 1.01 ng/dL (ref 0.82–1.77)

## 2022-05-07 ENCOUNTER — Ambulatory Visit: Payer: BC Managed Care – PPO | Admitting: "Endocrinology

## 2022-05-19 ENCOUNTER — Encounter: Payer: Self-pay | Admitting: "Endocrinology

## 2022-05-19 ENCOUNTER — Ambulatory Visit (INDEPENDENT_AMBULATORY_CARE_PROVIDER_SITE_OTHER): Payer: BC Managed Care – PPO | Admitting: "Endocrinology

## 2022-05-19 VITALS — BP 138/76 | HR 92 | Ht 61.0 in | Wt 230.4 lb

## 2022-05-19 DIAGNOSIS — E782 Mixed hyperlipidemia: Secondary | ICD-10-CM | POA: Diagnosis not present

## 2022-05-19 DIAGNOSIS — I10 Essential (primary) hypertension: Secondary | ICD-10-CM

## 2022-05-19 DIAGNOSIS — E1165 Type 2 diabetes mellitus with hyperglycemia: Secondary | ICD-10-CM | POA: Diagnosis not present

## 2022-05-19 LAB — POCT GLYCOSYLATED HEMOGLOBIN (HGB A1C): HbA1c, POC (controlled diabetic range): 10.2 % — AB (ref 0.0–7.0)

## 2022-05-19 MED ORDER — TOUJEO MAX SOLOSTAR 300 UNIT/ML ~~LOC~~ SOPN: 100.0000 [IU] | PEN_INJECTOR | Freq: Every day | SUBCUTANEOUS | 0 refills | Status: DC

## 2022-05-19 MED ORDER — DEXCOM G7 SENSOR MISC
2 refills | Status: DC
Start: 2022-05-19 — End: 2022-06-30

## 2022-05-19 MED ORDER — DEXCOM G7 RECEIVER DEVI
0 refills | Status: DC
Start: 2022-05-19 — End: 2022-07-10

## 2022-05-19 NOTE — Patient Instructions (Signed)
                                     Advice for Weight Management  -For most of us the best way to lose weight is by diet management. Generally speaking, diet management means consuming less calories intentionally which over time brings about progressive weight loss.  This can be achieved more effectively by avoiding ultra processed carbohydrates, processed meats, unhealthy fats.    It is critically important to know your numbers: how much calorie you are consuming and how much calorie you need. More importantly, our carbohydrates sources should be unprocessed naturally occurring  complex starch food items.  It is always important to balance nutrition also by  appropriate intake of proteins (mainly plant-based), healthy fats/oils, plenty of fruits and vegetables.   -The American College of Lifestyle Medicine (ACL M) recommends nutrition derived mostly from Whole Food, Plant Predominant Sources example an apple instead of applesauce or apple pie. Eat Plenty of vegetables, Mushrooms, fruits, Legumes, Whole Grains, Nuts, seeds in lieu of processed meats, processed snacks/pastries red meat, poultry, eggs.  Use only water or unsweetened tea for hydration.  The College also recommends the need to stay away from risky substances including alcohol, smoking; obtaining 7-9 hours of restorative sleep, at least 150 minutes of moderate intensity exercise weekly, importance of healthy social connections, and being mindful of stress and seek help when it is overwhelming.    -Sticking to a routine mealtime to eat 3 meals a day and avoiding unnecessary snacks is shown to have a big role in weight control. Under normal circumstances, the only time we burn stored energy is when we are hungry, so allow  some hunger to take place- hunger means no food between appropriate meal times, only water.  It is not advisable to starve.   -It is better to avoid simple carbohydrates including:  Cakes, Sweet Desserts, Ice Cream, Soda (diet and regular), Sweet Tea, Candies, Chips, Cookies, Store Bought Juices, Alcohol in Excess of  1-2 drinks a day, Lemonade,  Artificial Sweeteners, Doughnuts, Coffee Creamers, "Sugar-free" Products, etc, etc.  This is not a complete list.....    -Consulting with certified diabetes educators is proven to provide you with the most accurate and current information on diet.  Also, you may be  interested in discussing diet options/exchanges , we can schedule a visit with [name], RDN, CDE for individualized nutrition education.  -Exercise: If you are able: 30 -60 minutes a day ,4 days a week, or 150 minutes of moderate intensity exercise weekly.    The longer the better if tolerated.  Combine stretch, strength, and aerobic activities.  If you were told in the past that you have high risk for cardiovascular diseases, or if you are currently symptomatic, you may seek evaluation by your heart doctor prior to initiating moderate to intense exercise programs.                                  Additional Care Considerations for Diabetes/Prediabetes   -Diabetes  is a chronic disease.  The most important care consideration is regular follow-up with your diabetes care provider with the goal being avoiding or delaying its complications and to take advantage of advances in medications and technology.  If appropriate actions are taken early enough, type 2 diabetes can even be   reversed.  Seek information from the right source.  - Whole Food, Plant Predominant Nutrition is highly recommended: Eat Plenty of vegetables, Mushrooms, fruits, Legumes, Whole Grains, Nuts, seeds in lieu of processed meats, processed snacks/pastries red meat, poultry, eggs as recommended by American College of  Lifestyle Medicine (ACLM).  -Type 2 diabetes is known to coexist with other important comorbidities such as high blood pressure and high cholesterol.  It is critical to control not only the  diabetes but also the high blood pressure and high cholesterol to minimize and delay the risk of complications including coronary artery disease, stroke, amputations, blindness, etc.  The good news is that this diet recommendation for type 2 diabetes is also very helpful for managing high cholesterol and high blood blood pressure.  - Studies showed that people with diabetes will benefit from a class of medications known as ACE inhibitors and statins.  Unless there are specific reasons not to be on these medications, the standard of care is to consider getting one from these groups of medications at an optimal doses.  These medications are generally considered safe and proven to help protect the heart and the kidneys.    - People with diabetes are encouraged to initiate and maintain regular follow-up with eye doctors, foot doctors, dentists , and if necessary heart and kidney doctors.     - It is highly recommended that people with diabetes quit smoking or stay away from smoking, and get yearly  flu vaccine and pneumonia vaccine at least every 5 years.  See above for additional recommendations on exercise, sleep, stress management , and healthy social connections.      

## 2022-05-19 NOTE — Progress Notes (Signed)
05/19/2022, 4:23 PM   Endocrinology follow-up note   Subjective:    Patient ID: [name], female    DOB: 02/12/1961.  [name] is being seen in follow-up in the management of her currently uncontrolled type 2 diabetes, hypertension. [id number], Brittney Baxter, MD.   Past Medical History:  Diagnosis Date   Allergy    Asthma    COPD (chronic obstructive pulmonary disease) ([place])    Diabetes mellitus without complication ([place])    Hypertension    Morbid obesity due to excess calories ([place])    Umbilical hernia     Past Surgical History:  Procedure Laterality Date   CARPAL TUNNEL RELEASE Right    CHOLECYSTECTOMY     COLONOSCOPY N/A 01/12/2015   Procedure: COLONOSCOPY;  Surgeon: Danie Binder, MD;  Location: [facility];  Service: Endoscopy;  Laterality: N/A;  0830   HYSTEROSCOPY WITH D & C N/A 04/01/2013   Procedure: DILATATION AND CURETTAGE /HYSTEROSCOPY;  Surgeon: Florian Buff, MD;  Location: [facility];  Service: Gynecology;  Laterality: N/A;   NASAL POLYP SURGERY      Social History   Socioeconomic History   Marital status: Married    Spouse name: Not on file   Number of children: 2   Years of education: Not on file   Highest education level: Not on file  Occupational History   Not on file  Tobacco Use   Smoking status: Former    Packs/day: 0.50    Years: 20.00    Total pack years: 10.00    Types: Cigarettes    Quit date: 03/25/1997    Years since quitting: 25.1   Smokeless tobacco: Never  Vaping Use   Vaping Use: Never used  Substance and Sexual Activity   Alcohol use: Yes    Alcohol/week: 0.0 standard drinks of alcohol    Comment: Ocassional   Drug use: No   Sexual activity: Yes    Birth control/protection: None, Post-menopausal  Other Topics Concern   Not on file  Social History Narrative   Not on file   Social Determinants of Health   Financial Resource Strain:  Not on file  Food Insecurity: Not on file  Transportation Needs: Not on file  Physical Activity: Not on file  Stress: Not on file  Social Connections: Not on file    Family History  Problem Relation Age of Onset   Diabetes Mother    Kidney disease Mother    Other Mother        renal failure   Hypertension Mother    Cancer Mother        ?uterine cancer    Diabetes Father    Cancer Father        pancreatic   Diabetes Sister    Kidney disease Sister    Diabetes Maternal Grandmother    Asthma Maternal Grandmother    COPD Maternal Grandmother        bronchitis   Hypertension Maternal Grandmother    Thyroid disease Maternal Grandmother    Hypertension Daughter    Colon cancer Neg Hx     Outpatient Encounter Medications as of 05/19/2022  Medication  Sig   Continuous Blood Gluc Receiver (DEXCOM G7 RECEIVER) DEVI Use to monitor BG continuously   Continuous Blood Gluc Sensor (DEXCOM G7 SENSOR) MISC Change sensor every 10 days   acetaZOLAMIDE (DIAMOX) 250 MG tablet Take 250 mg by mouth 3 (three) times daily.   amLODipine (NORVASC) 5 MG tablet Take 1 tablet by mouth daily.   aspirin 81 MG EC tablet Take 1 tablet by mouth daily.   B-D UF III MINI PEN NEEDLES 31G X 5 MM MISC USE ONCE DAILY AS DIRECTED   diclofenac sodium (VOLTAREN) 1 % GEL 4 (four) times daily as needed.   fluticasone (FLONASE) 50 MCG/ACT nasal spray Place 2 sprays into both nostrils daily.   Fluticasone-Salmeterol (ADVAIR) 250-50 MCG/DOSE AEPB Inhale 1 puff into the lungs 2 (two) times daily.    gabapentin (NEURONTIN) 100 MG capsule Take 1 capsule by mouth 2 (two) times daily.   glipiZIDE (GLUCOTROL XL) 5 MG 24 hr tablet TAKE 1 TABLET BY MOUTH EVERY DAY WITH BREAKFAST   insulin glargine, 2 Unit Dial, (TOUJEO MAX SOLOSTAR) 300 UNIT/ML Solostar Pen Inject 100 Units into the skin at bedtime.   ipratropium (ATROVENT) 0.06 % nasal spray Place 2 sprays into both nostrils 2 (two) times daily.   Ipratropium-Albuterol  (COMBIVENT RESPIMAT IN) Inhale 1 puff into the lungs daily as needed (shortness of breath).   ipratropium-albuterol (DUONEB) 0.5-2.5 (3) MG/3ML SOLN Take 3 mLs by nebulization every 4 (four) hours as needed (Shortness of Breath).    losartan-hydrochlorothiazide (HYZAAR) 100-25 MG tablet Take 1 tablet by mouth daily.   metFORMIN (GLUCOPHAGE) 500 MG tablet TAKE 1 TABLET BY MOUTH TWICE A DAY WITH FOOD   Multiple Vitamin (MULTIVITAMIN WITH MINERALS) TABS tablet Take 1 tablet by mouth daily.   rosuvastatin (CRESTOR) 20 MG tablet Take 0.5 tablets (10 mg total) by mouth daily.   traMADol (ULTRAM) 50 MG tablet Take 50 mg by mouth 2 (two) times daily.   [DISCONTINUED] Continuous Blood Gluc Receiver (FREESTYLE LIBRE 2 READER) DEVI As directed   [DISCONTINUED] Continuous Blood Gluc Sensor (FREESTYLE LIBRE 2 SENSOR) MISC 1 PIECE BY DOES NOT APPLY ROUTE EVERY 14 (FOURTEEN) DAYS.   [DISCONTINUED] insulin glargine, 2 Unit Dial, (TOUJEO MAX SOLOSTAR) 300 UNIT/ML Solostar Pen Inject 90 Units into the skin at bedtime.   No facility-administered encounter medications on file as of 05/19/2022.    ALLERGIES: No Known Allergies  VACCINATION STATUS: Immunization History  Administered Date(s) Administered   Influenza-Unspecified 06/09/2010, 06/09/2013   Pneumococcal Polysaccharide-23 08/05/2021    Diabetes She presents for her follow-up diabetic visit. She has type 2 diabetes mellitus. Onset time: She was diagnosed at approximate age of 34 years. Her disease course has been improving. There are no hypoglycemic associated symptoms. Pertinent negatives for hypoglycemia include no confusion, headaches, pallor or seizures. Pertinent negatives for diabetes include no chest pain, no fatigue, no polydipsia, no polyphagia and no polyuria. There are no hypoglycemic complications. Symptoms are improving. There are no diabetic complications. Risk factors for coronary artery disease include diabetes mellitus, dyslipidemia,  family history, obesity, tobacco exposure, sedentary lifestyle, post-menopausal and hypertension. Current diabetic treatment includes insulin injections (She is currently on Basaglar 60 units twice daily,  Metformin 1000 mg p.o. twice daily, could not afford Januvia any longer, she remains on glipizide 5 mg XL p.o. daily breakfast.  ). Her weight is fluctuating minimally. She is following a generally unhealthy diet. When asked about meal planning, she reported none. She has not had a previous visit with a  dietitian. She rarely participates in exercise. Her home blood glucose trend is decreasing steadily. Her breakfast blood glucose range is generally >200 mg/dl. Her lunch blood glucose range is generally >200 mg/dl. Her dinner blood glucose range is generally >200 mg/dl. Her bedtime blood glucose range is generally >200 mg/dl. Her overall blood glucose range is >200 mg/dl. (She received her freestyle libre CGM device.  She shows improvement.  She presents with AGP report showing 21% time range, 42% with level 1 hyperglycemia, 37% level 2 hyperglycemia.  She has no hypoglycemia.  Her average blood glucose is 213 for the last 14 days.  Her point-of-care A1c is 10.2%, improved from 12.2% during her last visit.     ) An ACE inhibitor/angiotensin II receptor blocker is being taken.  Hyperlipidemia This is a chronic problem. The current episode started more than 1 year ago. The problem is controlled. Exacerbating diseases include diabetes and obesity. Pertinent negatives include no chest pain, myalgias or shortness of breath. Current antihyperlipidemic treatment includes statins. Risk factors for coronary artery disease include diabetes mellitus, dyslipidemia, family history, hypertension, obesity, a sedentary lifestyle and post-menopausal.  Hypertension This is a chronic problem. The current episode started more than 1 year ago. Pertinent negatives include no chest pain, headaches, palpitations or shortness of  breath. Risk factors for coronary artery disease include diabetes mellitus, dyslipidemia, obesity, post-menopausal state, smoking/tobacco exposure, sedentary lifestyle and family history. Past treatments include ACE inhibitors.    Review of systems  Constitutional: + Minimally fluctuating body weight,  current  Body mass index is 43.53 kg/m. , no fatigue, no subjective hyperthermia, no subjective hypothermia    Objective:    BP 138/76   Pulse 92   Ht '5\' 1"'$  (1.549 m)   Wt 230 lb 6.4 oz (104.5 kg)   LMP 02/25/2019   BMI 43.53 kg/m   Wt Readings from Last 3 Encounters:  05/19/22 230 lb 6.4 oz (104.5 kg)  01/06/22 226 lb 6.4 oz (102.7 kg)  12/23/21 223 lb 3.2 oz (101.2 kg)    Physical Exam- Limited  Constitutional:  Body mass index is 43.53 kg/m. , not in acute distress, normal state of mind    CMP     Component Value Date/Time   NA 141 05/05/2022 1101   K 4.5 05/05/2022 1101   CL 104 05/05/2022 1101   CO2 23 05/05/2022 1101   GLUCOSE 151 (H) 05/05/2022 1101   GLUCOSE 378 (H) 08/05/2021 0531   BUN 13 05/05/2022 1101   CREATININE 0.79 05/05/2022 1101   CALCIUM 10.6 (H) 05/05/2022 1101   PROT 6.7 05/05/2022 1101   ALBUMIN 4.6 05/05/2022 1101   AST 13 05/05/2022 1101   ALT 21 05/05/2022 1101   ALKPHOS 81 05/05/2022 1101   BILITOT <0.2 05/05/2022 1101   GFRNONAA >60 08/05/2021 0531   GFRAA >60 04/21/2019 0854     Diabetic Labs (most recent): Lab Results  Component Value Date   HGBA1C 10.2 (A) 05/19/2022   HGBA1C 11.3 (A) 01/06/2022   HGBA1C 8.4 (H) 08/04/2021   MICROALBUR 122.3 (H) 04/21/2019     Lipid Panel ( most recent) Lipid Panel     Component Value Date/Time   CHOL 116 05/05/2022 1101   TRIG 76 05/05/2022 1101   HDL 45 05/05/2022 1101   CHOLHDL 2.6 05/05/2022 1101   CHOLHDL 3.5 04/21/2019 0855   VLDL 28 04/21/2019 0855   LDLCALC 56 05/05/2022 1101   LABVLDL 15 05/05/2022 1101      Lab Results  Component Value Date   TSH 2.110 05/05/2022    TSH 1.210 11/07/2020   TSH 2.110 01/16/2020   TSH 3.399 03/07/2013   FREET4 1.01 05/05/2022   FREET4 1.13 11/07/2020   FREET4 1.06 01/16/2020     Assessment & Plan:   1. Uncontrolled type 2 diabetes mellitus with hyperglycemia ([place])  - [name] has currently uncontrolled symptomatic type 2 DM since 61 years of age.  She received her freestyle libre CGM device.  She shows improvement.  She presents with AGP report showing 21% time range, 42% with level 1 hyperglycemia, 37% level 2 hyperglycemia.  She has no hypoglycemia.  Her average blood glucose is 213 for the last 14 days.  Her point-of-care A1c is 10.2%, improved from 12.2% during her last visit.    - I had a long discussion with her about the progressive nature of diabetes and the pathology behind its complications. -her diabetes is complicated by obesity, non- engagement for management plan,  history of smoking and she remains at a high risk for more acute and chronic complications which include CAD, CVA, CKD, retinopathy, and neuropathy. These are all discussed in detail with her.  - I have counseled her on diet  and weight management  by adopting a carbohydrate restricted/protein rich diet. Patient is encouraged to switch to  unprocessed or minimally processed     complex starch and increased protein intake (animal or plant source), fruits, and vegetables. -  she is advised to stick to a routine mealtimes to eat 3 meals  a day and avoid unnecessary snacks ( to snack only to correct hypoglycemia).   - she acknowledges that there is a room for improvement in her food and drink choices. - Suggestion is made for her to avoid simple carbohydrates  from her diet including Cakes, Sweet Desserts, Ice Cream, Soda (diet and regular), Sweet Tea, Candies, Chips, Cookies, Store Bought Juices, Alcohol , Artificial Sweeteners,  Coffee Creamer, and "Sugar-free" Products, Lemonade. This will help patient to have more stable blood glucose profile  and potentially avoid unintended weight gain.  The following Lifestyle Medicine recommendations according to [facility]) were discussed and and offered to patient and she  agrees to start the journey:  A. Whole Foods, Plant-Based Nutrition comprising of fruits and vegetables, plant-based proteins, whole-grain carbohydrates was discussed in detail with the patient.   A list for source of those nutrients were also provided to the patient.  Patient will use only water or unsweetened tea for hydration. B.  The need to stay away from risky substances including alcohol, smoking; obtaining 7 to 9 hours of restorative sleep, at least 150 minutes of moderate intensity exercise weekly, the importance of healthy social connections,  and stress management techniques were discussed. C.  A full color page of  Calorie density of various food groups per pound showing examples of each food groups was provided to the patient.   - she has been scheduled with [name], RDN, CDE for diabetes education.  - I have approached her with the following individualized plan to manage  her diabetes and patient agrees:    -[name].  She has a CGM now.   In light of her presentation with slow  improvement in her  glycemic profile, she will not need prandial insulin for now.  She is advised to increase her Toujeo to 100 units nightly, associated with monitoring of blood glucose continuously using her  CGM.   -  patient is advised to call clinic for blood glucose readings above 200 or less than 70 mg per DL.   She is requesting for [facility] due to ease of coverage by her insurance instead of [facility]. - she is advised to continue Metformin 500 mg p.o. twice daily, glipizide 5 mg XL p.o. daily at breakfast.    - Specific targets for  A1c;  LDL, HDL,  and Triglycerides were discussed with the patient.  2) Blood Pressure /Hypertension:   Her blood pressure is controlled  to target.  She has amlodipine 5 mg p.o. daily, Hyzaar, losartan/HCTZ.   The above described WF PB diet will help with hypertension and hyperlipidemia as well.  The most likely reason for uncontrolled hypertension is her nonadherence to medications as well as lifestyle recommendations.   3) Lipids/Hyperlipidemia:   Review of her recent lipid panel showed  controlled  LDL at 59.  She is advised to continue Crestor  20 mg p.o. daily at bedtime.    Side effects and precautions discussed with her.    4)  Weight/Diet: Her BMI is 43.53--   clearly complicating her diabetes care.   she is  a candidate for modest weight loss. I discussed with her the fact that loss of 5 - 10% of her  current body weight will have the most impact on her diabetes management.  Exercise, and detailed carbohydrates information provided  -  detailed on discharge instructions.  5) Chronic Care/Health Maintenance:  -she  is on ACEI/ARB and Statin medications and  is encouraged to initiate and continue to follow up with Ophthalmology, Dentist,  Podiatrist at least yearly or according to recommendations, and advised to  stay away from smoking. I have recommended yearly flu vaccine and pneumonia vaccine at least every 5 years; moderate intensity exercise for up to 150 minutes weekly; and  sleep for at least 7 hours a day.  Her ABI was recently normal on April 26, 2020.  - she is  advised to maintain close follow up with Carrolyn Meiers, MD for primary care needs, as well as her other providers for optimal and coordinated care.    I spent 41 minutes in the care of the patient today including review of labs from [facility], Lipids, Thyroid Function, Hematology (current and previous including abstractions from other facilities); face-to-face time discussing  her blood glucose readings/logs, discussing hypoglycemia and hyperglycemia episodes and symptoms, medications doses, her options of short and long term treatment based on the  latest standards of care / guidelines;  discussion about incorporating lifestyle medicine;  and documenting the encounter. Risk reduction counseling performed per USPSTF guidelines to reduce obesity and cardiovascular risk factors.     Please refer to Patient Instructions for Blood Glucose Monitoring and Insulin/Medications Dosing Guide"  in media tab for additional information. Please  also refer to " Patient Self Inventory" in the Media  tab for reviewed elements of pertinent patient history.  [name] participated in the discussions, expressed understanding, and voiced agreement with the above plans.  All questions were answered to her satisfaction. she is encouraged to contact clinic should she have any questions or concerns prior to her return visit.       Follow up plan: - Return in about 3 months (around 08/18/2022) for Bring Meter/CGM Device/Logs- A1c in Office.  Glade Lloyd, MD [facility] [address] [place], [place] [address] Phone: [phone]  Fax: [phone]  05/19/2022, 4:23 PM  This note was partially dictated with voice recognition software. Similar sounding words can be transcribed inadequately or may not  be corrected upon review.

## 2022-05-21 IMAGING — DX DG CHEST 2V
2 series · 2 of 2 positions shown · non-contrast
Comparison: 05/05/2021

CLINICAL DATA: Cough, congestion, shortness of breath, positive for
flu at the end [DATE], history asthma

EXAM:
CHEST - 2 VIEW

[chest pa]
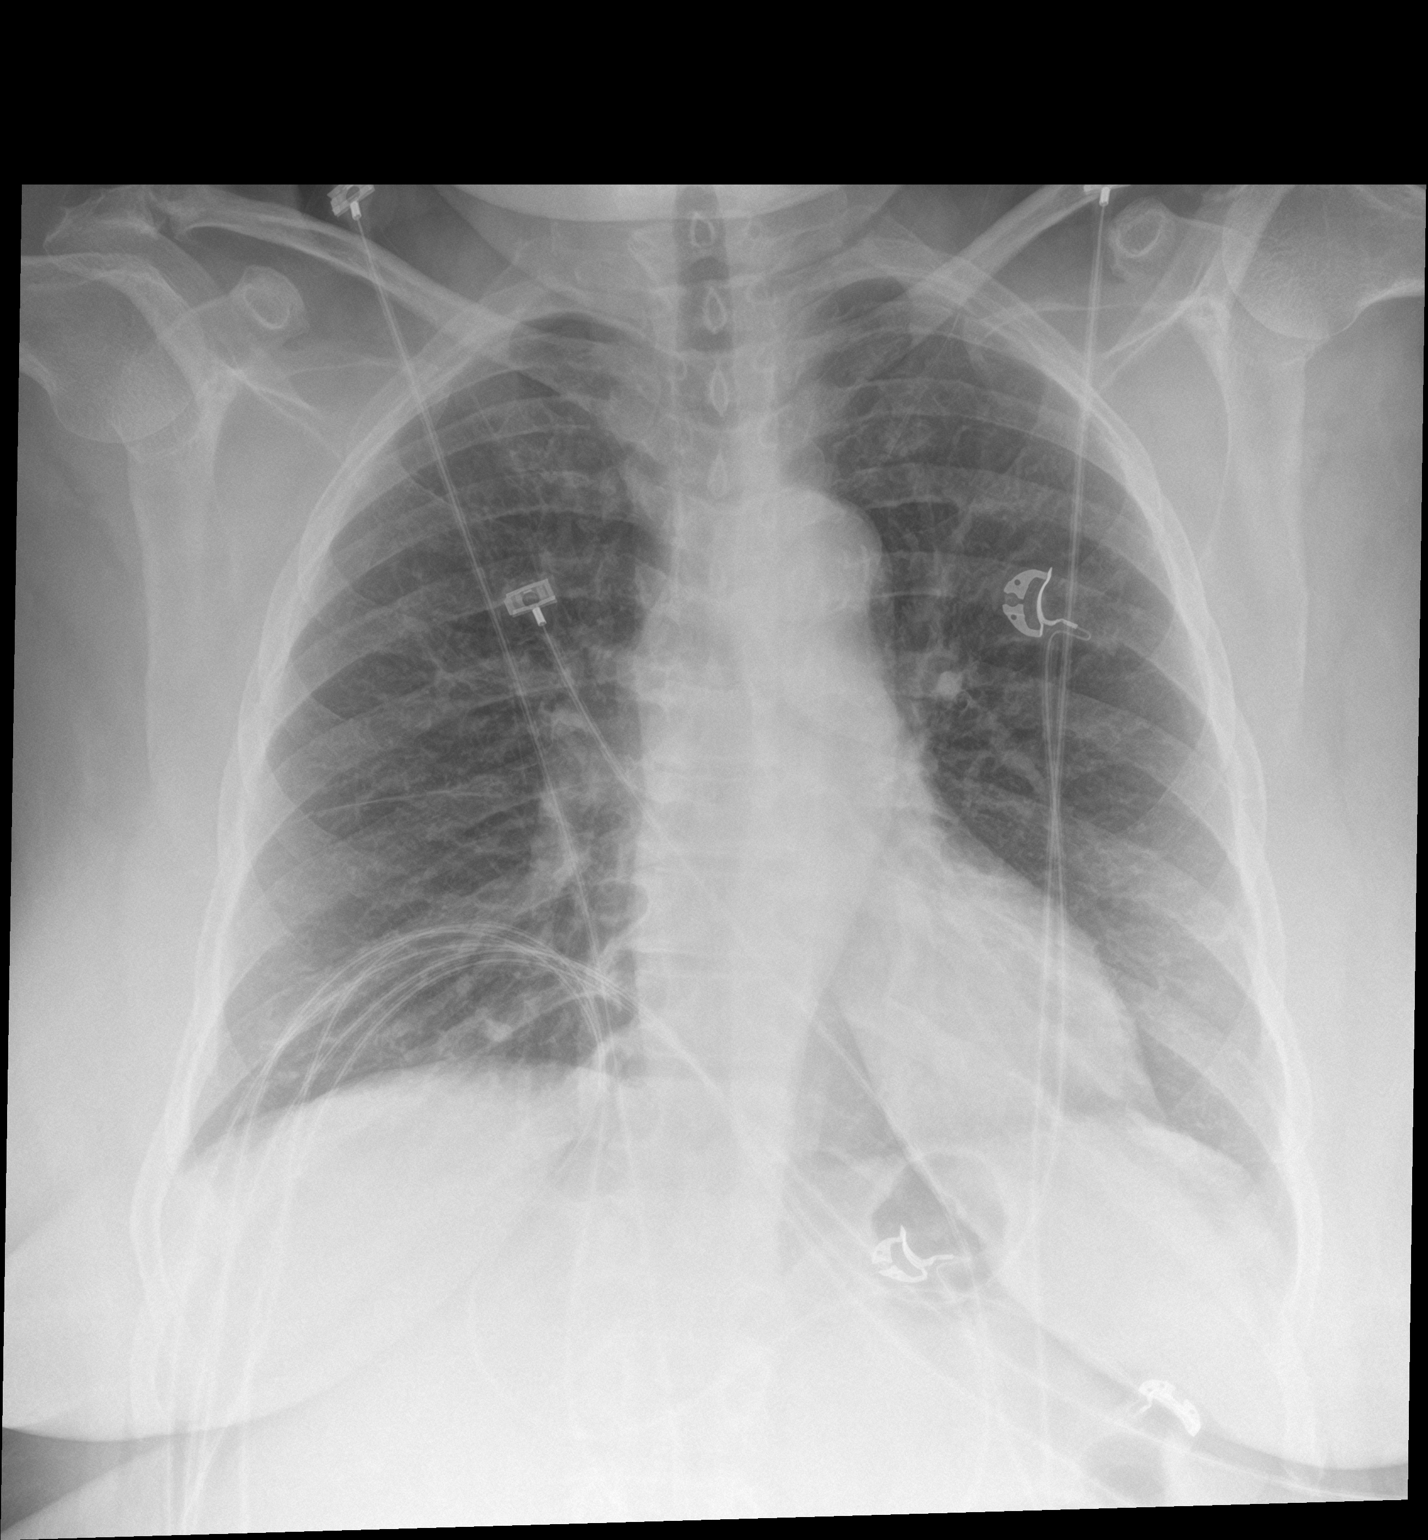

[chest lat]
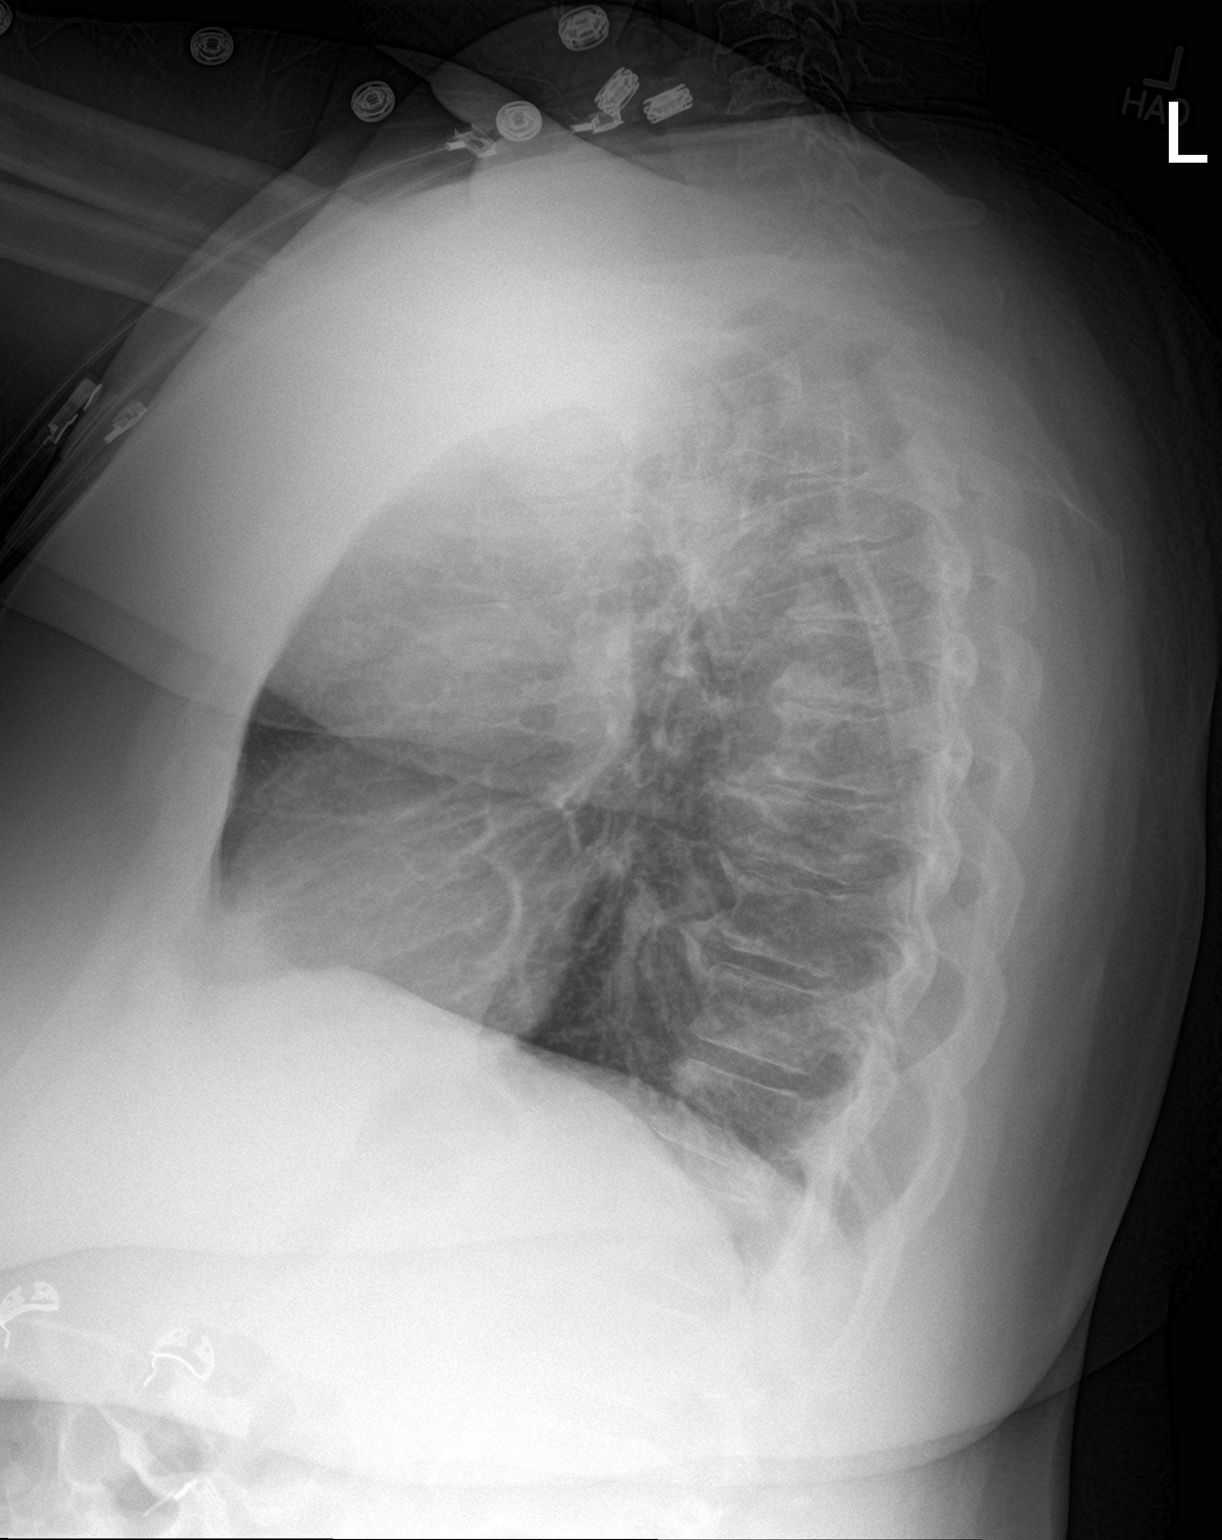

[2 of 2 positions shown; findings below may reference images not displayed]

FINDINGS: Normal heart size, mediastinal contours, and pulmonary vascularity.

Atherosclerotic calcification aorta.

Lungs clear.

No infiltrate, pleural effusion, or pneumothorax.

Minimal endplate spur formation thoracic spine.
IMPRESSION: No acute abnormalities.

Aortic Atherosclerosis ([id number]-452.2).

## 2022-05-21 IMAGING — CT CT ABD-PELV W/ CM
2 of 5 series · 16 of 46 positions shown, 18 images · IV contrast (Omnipaque or Isovue)
Comparison: None.

CLINICAL DATA: RIGHT-sided abdominal pain.  Nausea and constipation

EXAM:
CT ABDOMEN AND PELVIS WITH CONTRAST
TECHNIQUE: Multidetector CT imaging of the abdomen and pelvis was performed
using the standard protocol following bolus administration of
intravenous contrast.
CONTRAST:  100mL OMNIPAQUE IOHEXOL 300 MG/ML  SOLN

[Series 2: axial st · axial · 0.97mm/px · z∈[+892,+1302]mm · 13 of 94 slices shown, 15 images]
[im 6/94  soft-tissue]
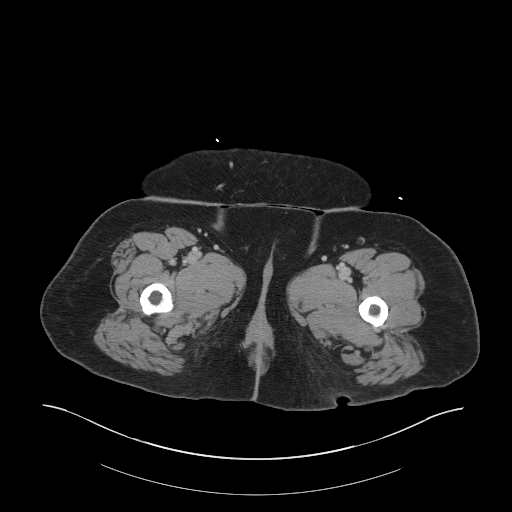
[im 6/94  bone]
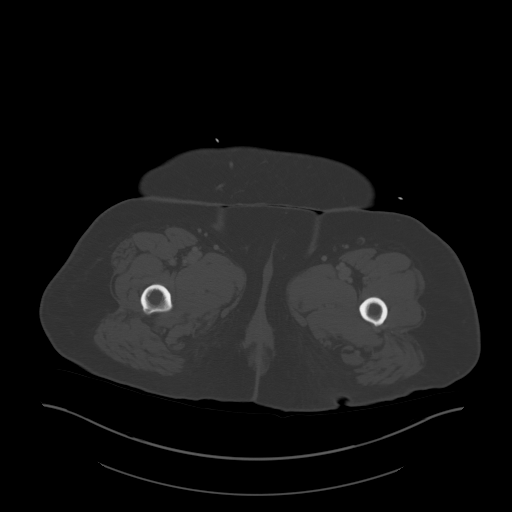
[im 11/94  soft-tissue]
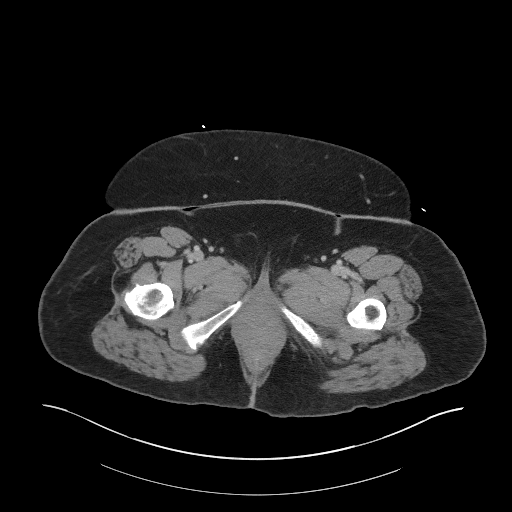
[im 22/94  soft-tissue]
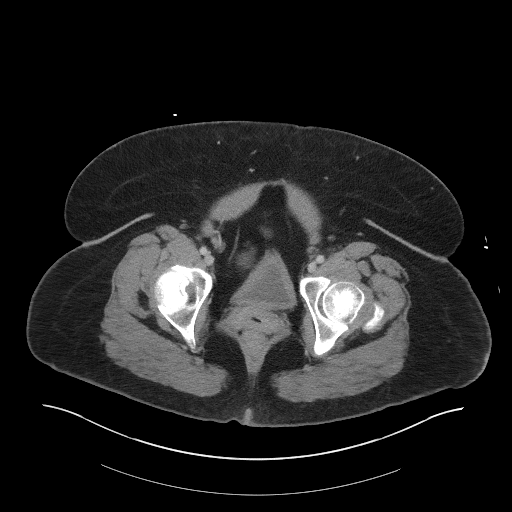
[im 28/94  soft-tissue]
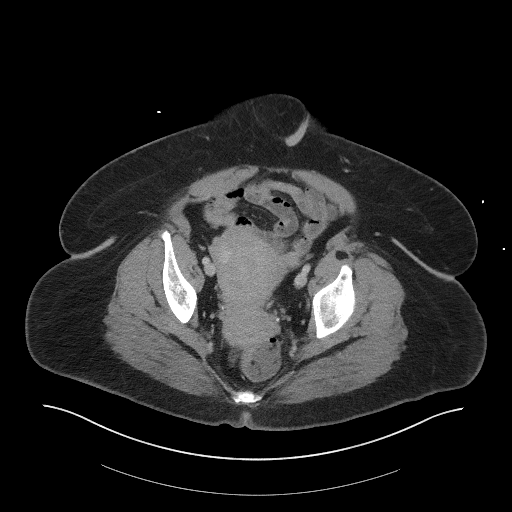
[im 33/94  soft-tissue]
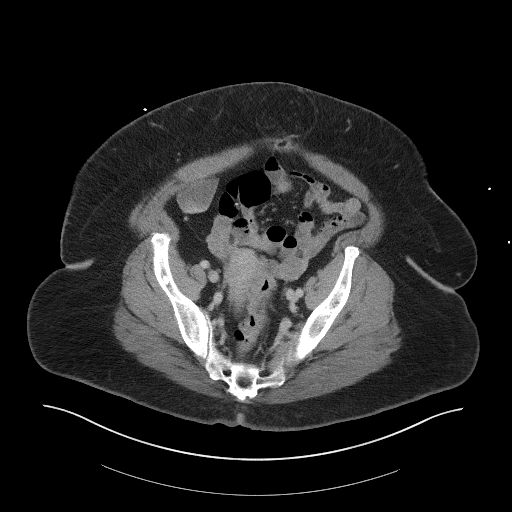
[im 39/94  soft-tissue]
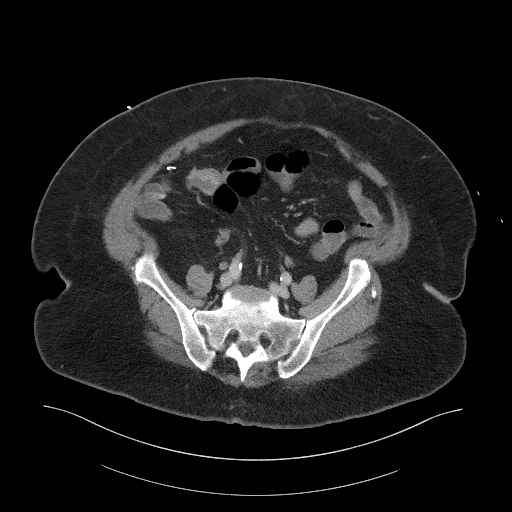
[im 50/94  soft-tissue]
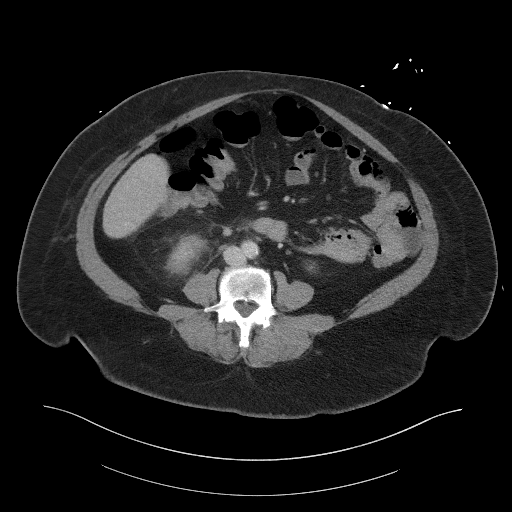
[im 55/94  soft-tissue]
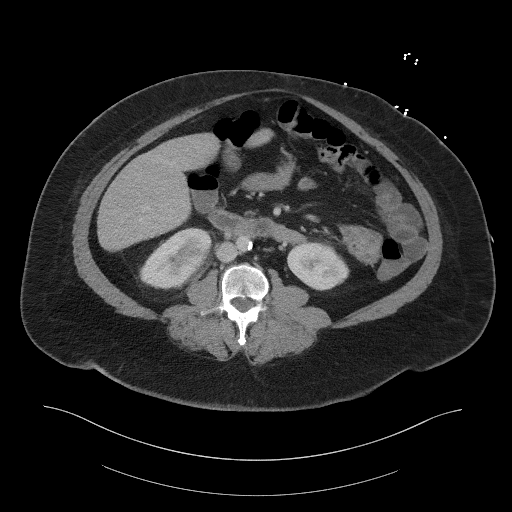
[im 61/94  soft-tissue]
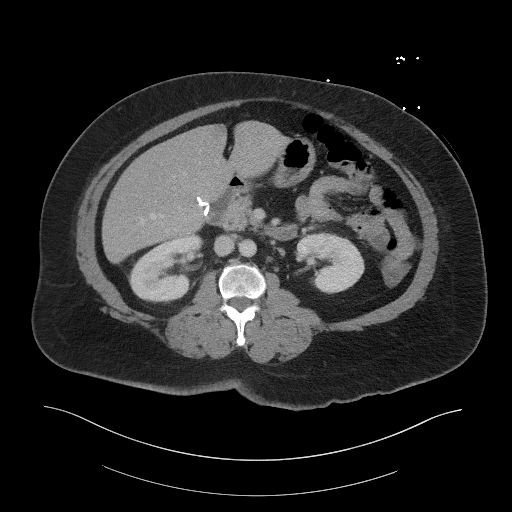
[im 61/94  bone]
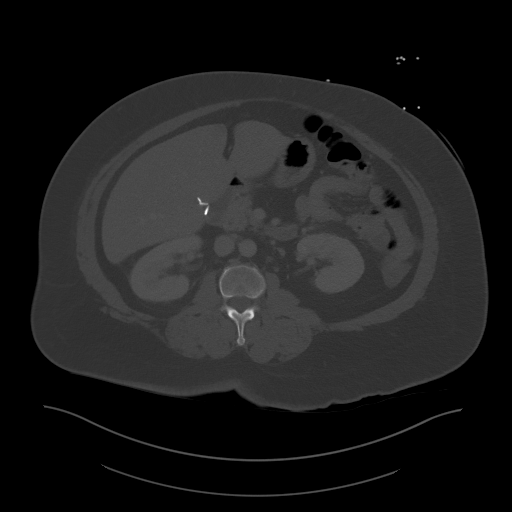
[im 66/94  soft-tissue]
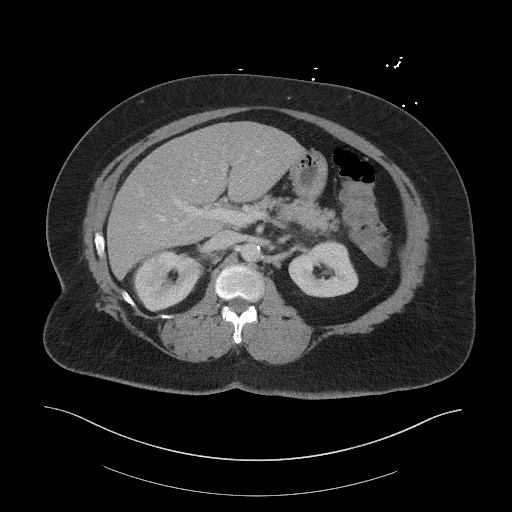
[im 72/94  soft-tissue]
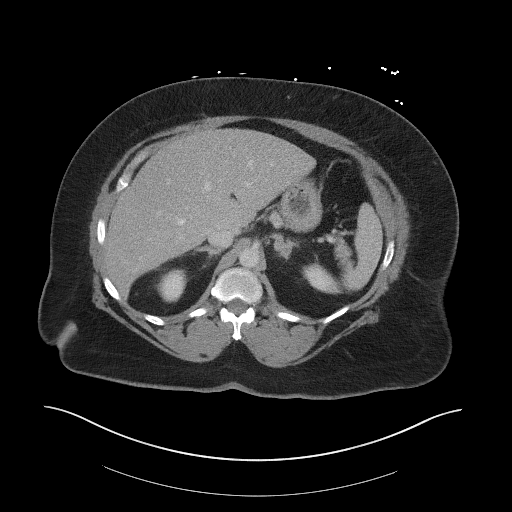
[im 83/94  soft-tissue]
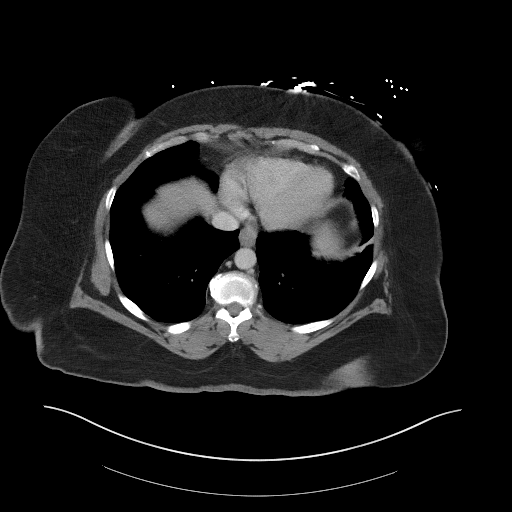
[im 88/94  soft-tissue]
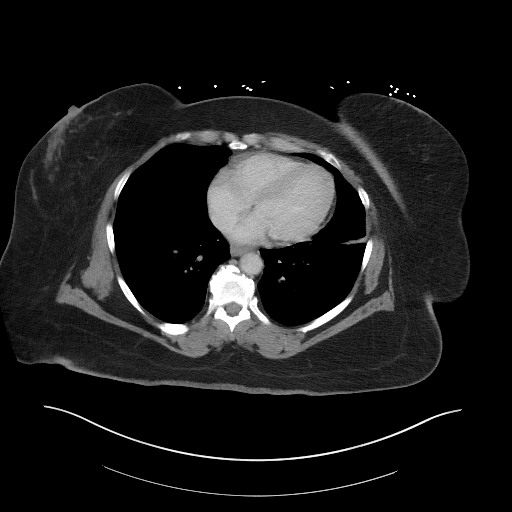

[Series 5: coronal st · coronal · 0.93mm/px · 3 of 120 slices shown]
[im 40/120  soft-tissue]
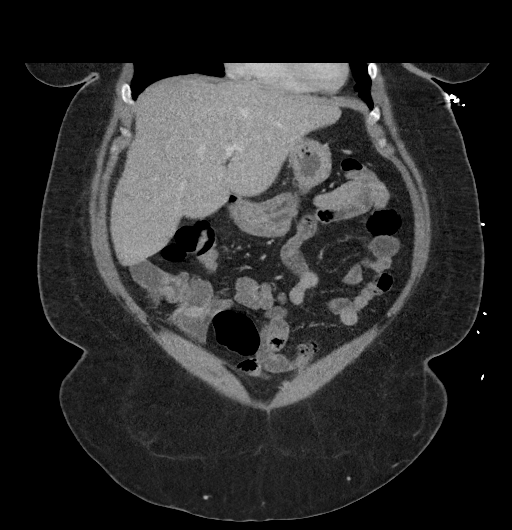
[im 53/120  soft-tissue]
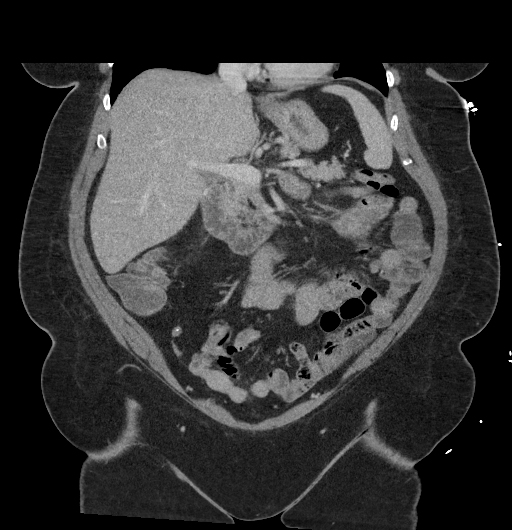
[im 67/120  soft-tissue]
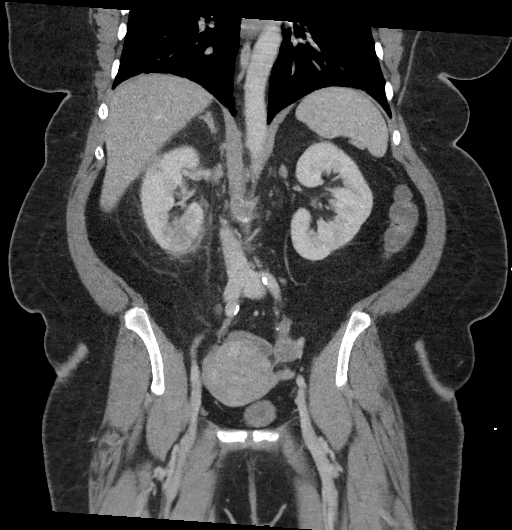

[16 of 46 positions shown; findings below may reference images not displayed]

FINDINGS: Lower chest: Lung bases are clear.

Hepatobiliary: No focal hepatic lesion. Postcholecystectomy. No
biliary dilatation.

Pancreas: Pancreas is normal. No ductal dilatation. No pancreatic
inflammation.

Spleen: Normal spleen

Adrenals/urinary tract: Thickened adrenal glands is favored adrenal
hyperplasia.

There is heterogeneous enhancement of the lower pole of the RIGHT
kidney (image 43/series 2). This involves approximately 4 cm by 2 cm
focus of cortex in lower pole. There is perinephric stranding
surrounding the lower pole . There is a small subcapsular fluid
collection at the lower RIGHT pole (image 70/series 5). Findings are
most suggestive of pyelonephritis lower pole RIGHT kidney. There is
additional subtle hypoperfusion in the cortex of the upper pole as
seen on coronal image 74/series 5.

Ureters normal.  Bladder normal.

Stomach/Bowel: Stomach, small bowel, appendix, and cecum are normal.
The colon and rectosigmoid colon are normal.

Vascular/Lymphatic: Abdominal aorta is normal caliber with
atherosclerotic calcification. There is no retroperitoneal or
periportal lymphadenopathy. No pelvic lymphadenopathy.

Reproductive: Lobular in enlargement of the uterine fundus suggest
underlying leiomyomas.

Other: Midline ventral hernia just superior to the umbilicus
contains peritoneal fat

Musculoskeletal: No aggressive osseous lesion.
IMPRESSION: Findings most consistent with focal pyelonephritis in the lower pole
of the RIGHT kidney. More subtle generalized pyelonephritis of the
upper pole the RIGHT kidney.

## 2022-05-29 DIAGNOSIS — H524 Presbyopia: Secondary | ICD-10-CM | POA: Diagnosis not present

## 2022-05-29 DIAGNOSIS — Z794 Long term (current) use of insulin: Secondary | ICD-10-CM | POA: Diagnosis not present

## 2022-05-29 DIAGNOSIS — H2513 Age-related nuclear cataract, bilateral: Secondary | ICD-10-CM | POA: Diagnosis not present

## 2022-05-29 DIAGNOSIS — H5212 Myopia, left eye: Secondary | ICD-10-CM | POA: Diagnosis not present

## 2022-05-29 DIAGNOSIS — E119 Type 2 diabetes mellitus without complications: Secondary | ICD-10-CM | POA: Diagnosis not present

## 2022-06-06 ENCOUNTER — Telehealth: Payer: Self-pay

## 2022-06-06 NOTE — Telephone Encounter (Signed)
Pt states on her CGM it is showing that her glucose average for the past week is 226. She stated last week it was in the 200s also. I have sent an invitation to share her data with [place]. States she takes glipizide '5mg'$  daily, toujeo 100 units qhs and metformin '500mg'$  daily.

## 2022-06-06 NOTE — Telephone Encounter (Signed)
Left a message making pt aware.

## 2022-06-12 ENCOUNTER — Other Ambulatory Visit: Payer: Self-pay | Admitting: "Endocrinology

## 2022-06-29 ENCOUNTER — Other Ambulatory Visit: Payer: Self-pay | Admitting: "Endocrinology

## 2022-07-08 ENCOUNTER — Telehealth: Payer: Self-pay

## 2022-07-08 NOTE — Telephone Encounter (Signed)
Left a message requesting pt return call to the office. ?

## 2022-07-10 ENCOUNTER — Other Ambulatory Visit: Payer: Self-pay

## 2022-07-10 ENCOUNTER — Other Ambulatory Visit: Payer: Self-pay | Admitting: "Endocrinology

## 2022-07-10 DIAGNOSIS — E1165 Type 2 diabetes mellitus with hyperglycemia: Secondary | ICD-10-CM

## 2022-07-10 MED ORDER — DEXCOM G7 RECEIVER DEVI: 0 refills | Status: DC

## 2022-07-10 MED ORDER — DEXCOM G7 SENSOR MISC: 2 refills | Status: AC

## 2022-07-15 ENCOUNTER — Other Ambulatory Visit: Payer: Self-pay | Admitting: "Endocrinology

## 2022-07-15 DIAGNOSIS — E1165 Type 2 diabetes mellitus with hyperglycemia: Secondary | ICD-10-CM | POA: Diagnosis not present

## 2022-07-15 DIAGNOSIS — I10 Essential (primary) hypertension: Secondary | ICD-10-CM | POA: Diagnosis not present

## 2022-07-15 DIAGNOSIS — J45901 Unspecified asthma with (acute) exacerbation: Secondary | ICD-10-CM | POA: Diagnosis not present

## 2022-07-25 IMAGING — DX DG CHEST 1V PORT
1 series · 1 of 1 positions shown · non-contrast
Comparison: 07/18/2021

CLINICAL DATA: Dyspnea

EXAM:
PORTABLE CHEST 1 VIEW

[chest ap]
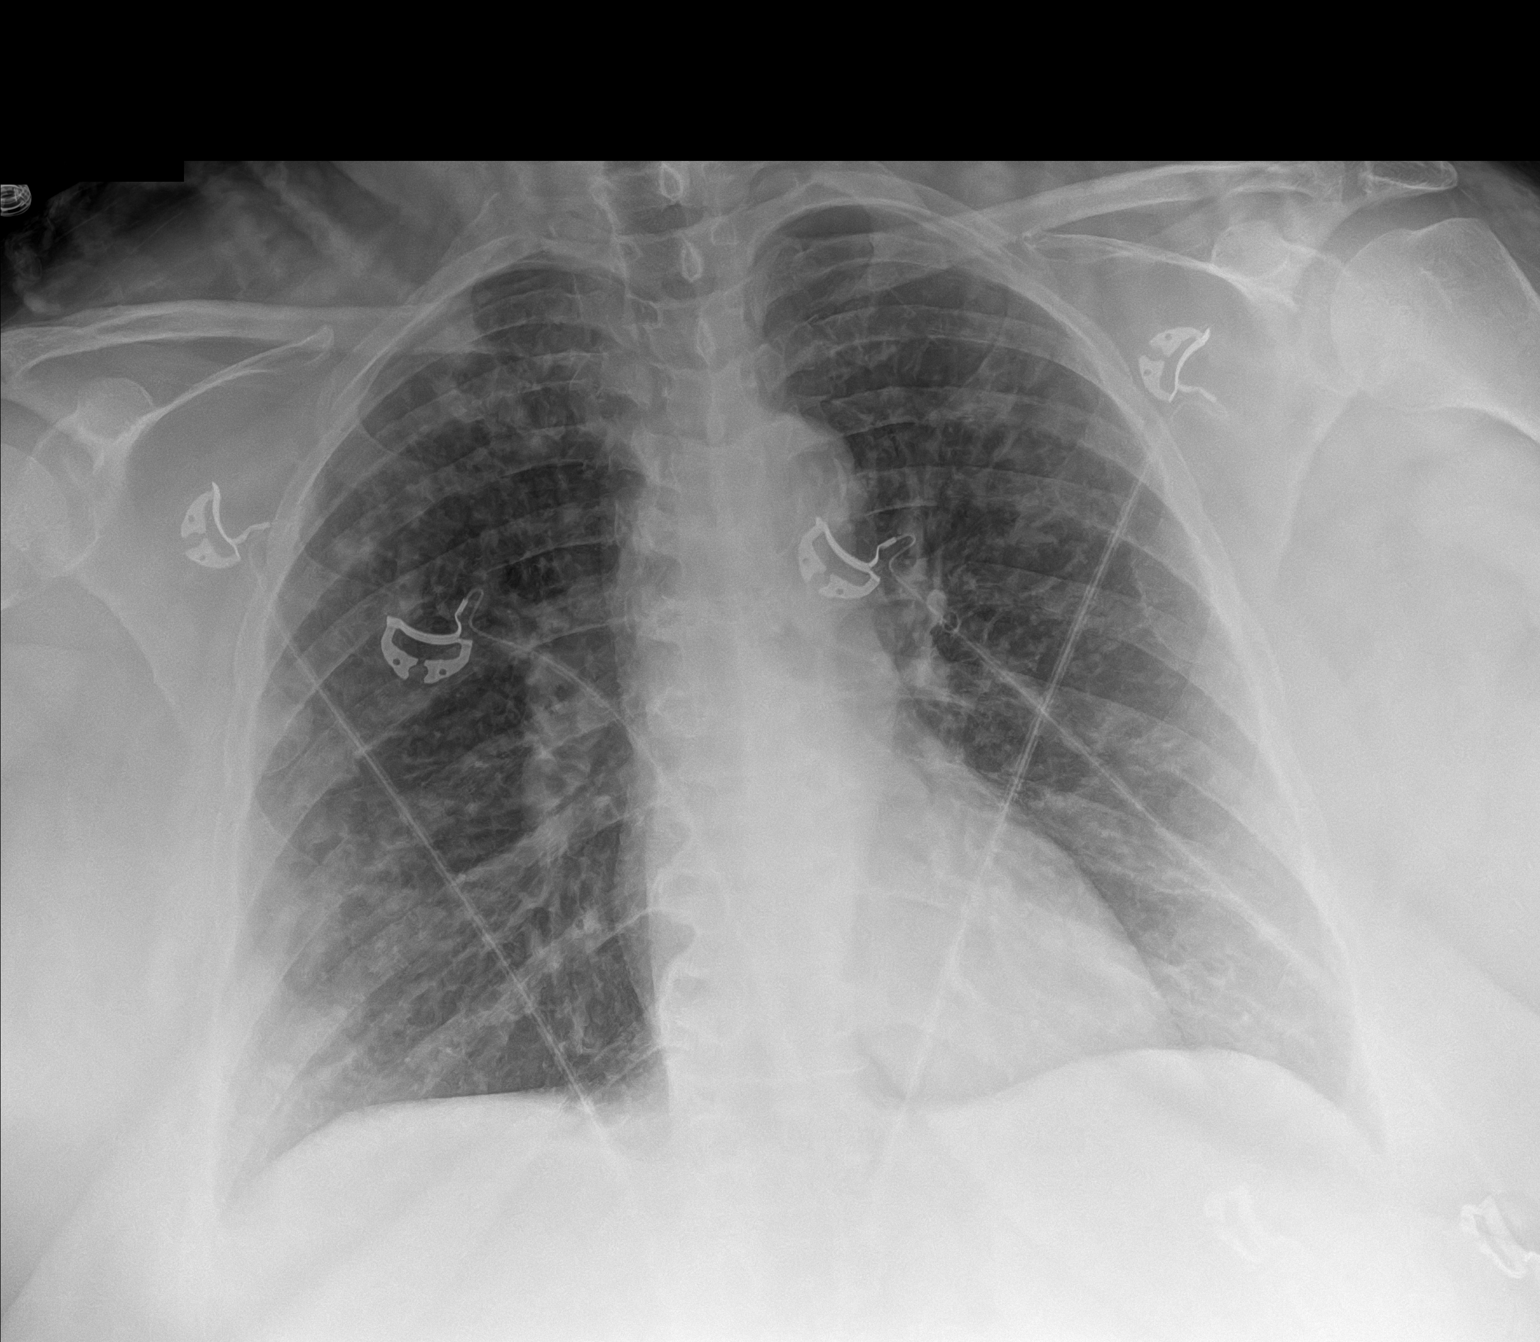

[1 of 1 positions shown; findings below may reference images not displayed]

FINDINGS: Normal heart size. Aortic atherosclerosis. Interval development of
multifocal patchy interstitial and airspace opacities bilaterally,
more pronounced within the periphery of the lungs. No pleural
effusion or pneumothorax.
IMPRESSION: Multifocal patchy interstitial and airspace opacities bilaterally,
more pronounced within the periphery of the lungs. Findings are
concerning for multifocal atypical/viral pneumonia (including
[id number]).

## 2022-08-04 ENCOUNTER — Other Ambulatory Visit (HOSPITAL_COMMUNITY): Payer: Self-pay

## 2022-08-04 ENCOUNTER — Telehealth: Payer: Self-pay | Admitting: Pharmacy Technician

## 2022-08-04 NOTE — Telephone Encounter (Signed)
Pharmacy Patient Advocate Encounter  Prior Authorization for Dexcom G7 has been approved.    PA# PA Case ID: [id number] dates: 07/26/22 through 07/27/23  Filled 07/26/22 fillable again 08/18/22 or after

## 2022-08-21 ENCOUNTER — Ambulatory Visit (INDEPENDENT_AMBULATORY_CARE_PROVIDER_SITE_OTHER): Payer: BC Managed Care – PPO | Admitting: "Endocrinology

## 2022-08-21 ENCOUNTER — Encounter: Payer: Self-pay | Admitting: "Endocrinology

## 2022-08-21 VITALS — BP 130/78 | HR 72 | Ht 61.0 in | Wt 228.0 lb

## 2022-08-21 DIAGNOSIS — E782 Mixed hyperlipidemia: Secondary | ICD-10-CM

## 2022-08-21 DIAGNOSIS — E1165 Type 2 diabetes mellitus with hyperglycemia: Secondary | ICD-10-CM | POA: Diagnosis not present

## 2022-08-21 DIAGNOSIS — I10 Essential (primary) hypertension: Secondary | ICD-10-CM | POA: Diagnosis not present

## 2022-08-21 MED ORDER — NOVOLOG MIX 70/30 FLEXPEN (70-30) 100 UNIT/ML ~~LOC~~ SUPN: 60.0000 [IU] | PEN_INJECTOR | Freq: Two times a day (BID) | SUBCUTANEOUS | 1 refills | Status: DC

## 2022-08-21 NOTE — Progress Notes (Signed)
08/21/2022, 4:54 PM   Endocrinology follow-up note   Subjective:    Patient ID: [name], female    DOB: 08-27-1960.  [name] is being seen in follow-up in the management of her currently uncontrolled type 2 diabetes, hypertension. [id number], [name], MD.   Past Medical History:  Diagnosis Date   Allergy    Asthma    COPD (chronic obstructive pulmonary disease) ([place])    Diabetes mellitus without complication ([place])    Hypertension    Morbid obesity due to excess calories ([place])    Umbilical hernia     Past Surgical History:  Procedure Laterality Date   CARPAL TUNNEL RELEASE Right    CHOLECYSTECTOMY     COLONOSCOPY N/A 01/12/2015   Procedure: COLONOSCOPY;  Surgeon: Danie Binder, MD;  Location: [facility];  Service: Endoscopy;  Laterality: N/A;  0830   HYSTEROSCOPY WITH D & C N/A 04/01/2013   Procedure: DILATATION AND CURETTAGE /HYSTEROSCOPY;  Surgeon: Florian Buff, MD;  Location: [facility];  Service: Gynecology;  Laterality: N/A;   NASAL POLYP SURGERY      Social History   Socioeconomic History   Marital status: Married    Spouse name: Not on file   Number of children: 2   Years of education: Not on file   Highest education level: Not on file  Occupational History   Not on file  Tobacco Use   Smoking status: Former    Packs/day: 0.50    Years: 20.00    Additional pack years: 0.00    Total pack years: 10.00    Types: Cigarettes    Quit date: 03/25/1997    Years since quitting: 25.4   Smokeless tobacco: Never  Vaping Use   Vaping Use: Never used  Substance and Sexual Activity   Alcohol use: Yes    Alcohol/week: 0.0 standard drinks of alcohol    Comment: Ocassional   Drug use: No   Sexual activity: Yes    Birth control/protection: None, Post-menopausal  Other Topics Concern   Not on file  Social History Narrative   Not on file   Social Determinants of Health    Financial Resource Strain: Not on file  Food Insecurity: Not on file  Transportation Needs: Not on file  Physical Activity: Not on file  Stress: Not on file  Social Connections: Not on file    Family History  Problem Relation Age of Onset   Diabetes Mother    Kidney disease Mother    Other Mother        renal failure   Hypertension Mother    Cancer Mother        ?uterine cancer    Diabetes Father    Cancer Father        pancreatic   Diabetes Sister    Kidney disease Sister    Diabetes Maternal Grandmother    Asthma Maternal Grandmother    COPD Maternal Grandmother        bronchitis   Hypertension Maternal Grandmother    Thyroid disease Maternal Grandmother    Hypertension Daughter    Colon cancer Neg Hx     Outpatient  Encounter Medications as of 08/21/2022  Medication Sig   insulin aspart protamine - aspart (NOVOLOG MIX 70/30 FLEXPEN) (70-30) 100 UNIT/ML FlexPen Inject 60 Units into the skin 2 (two) times daily with a meal.   acetaZOLAMIDE (DIAMOX) 250 MG tablet Take 250 mg by mouth 3 (three) times daily.   amLODipine (NORVASC) 5 MG tablet Take 1 tablet by mouth daily.   aspirin 81 MG EC tablet Take 1 tablet by mouth daily.   Continuous Blood Gluc Receiver (DEXCOM G7 RECEIVER) DEVI Use to monitor BG continuously   Continuous Blood Gluc Sensor (DEXCOM G7 SENSOR) MISC Change sensor every 10 days. Use to check glucose as directed.   diclofenac sodium (VOLTAREN) 1 % GEL 4 (four) times daily as needed.   fluticasone (FLONASE) 50 MCG/ACT nasal spray Place 2 sprays into both nostrils daily.   Fluticasone-Salmeterol (ADVAIR) 250-50 MCG/DOSE AEPB Inhale 1 puff into the lungs 2 (two) times daily.    gabapentin (NEURONTIN) 100 MG capsule Take 1 capsule by mouth 2 (two) times daily.   glipiZIDE (GLUCOTROL XL) 5 MG 24 hr tablet TAKE 1 TABLET BY MOUTH EVERY DAY WITH BREAKFAST   Insulin Pen Needle (B-D UF III MINI PEN NEEDLES) 31G X 5 MM MISC USE ONCE DAILY AS DIRECTED   ipratropium  (ATROVENT) 0.06 % nasal spray Place 2 sprays into both nostrils 2 (two) times daily.   Ipratropium-Albuterol (COMBIVENT RESPIMAT IN) Inhale 1 puff into the lungs daily as needed (shortness of breath).   ipratropium-albuterol (DUONEB) 0.5-2.5 (3) MG/3ML SOLN Take 3 mLs by nebulization every 4 (four) hours as needed (Shortness of Breath).    losartan-hydrochlorothiazide (HYZAAR) 100-25 MG tablet Take 1 tablet by mouth daily.   metFORMIN (GLUCOPHAGE) 500 MG tablet TAKE 1 TABLET BY MOUTH TWICE A DAY WITH FOOD   Multiple Vitamin (MULTIVITAMIN WITH MINERALS) TABS tablet Take 1 tablet by mouth daily.   rosuvastatin (CRESTOR) 20 MG tablet Take 0.5 tablets (10 mg total) by mouth daily.   traMADol (ULTRAM) 50 MG tablet Take 50 mg by mouth 2 (two) times daily.   [DISCONTINUED] insulin glargine, 2 Unit Dial, (TOUJEO MAX SOLOSTAR) 300 UNIT/ML Solostar Pen Inject 100 Units into the skin at bedtime.   No facility-administered encounter medications on file as of 08/21/2022.    ALLERGIES: No Known Allergies  VACCINATION STATUS: Immunization History  Administered Date(s) Administered   Influenza-Unspecified 06/09/2010, 06/09/2013   Pneumococcal Polysaccharide-23 08/05/2021    Diabetes She presents for her follow-up diabetic visit. She has type 2 diabetes mellitus. Onset time: She was diagnosed at approximate age of 11 years. Her disease course has been fluctuating. There are no hypoglycemic associated symptoms. Pertinent negatives for hypoglycemia include no confusion, headaches, pallor or seizures. Pertinent negatives for diabetes include no chest pain, no fatigue, no polydipsia, no polyphagia and no polyuria. There are no hypoglycemic complications. Symptoms are improving. There are no diabetic complications. Risk factors for coronary artery disease include diabetes mellitus, dyslipidemia, family history, obesity, tobacco exposure, sedentary lifestyle, post-menopausal and hypertension. Current diabetic  treatment includes insulin injections. Her weight is fluctuating minimally. She is following a generally unhealthy diet. When asked about meal planning, she reported none. She has not had a previous visit with a dietitian. She rarely participates in exercise. Her home blood glucose trend is decreasing steadily. Her breakfast blood glucose range is generally >200 mg/dl. Her lunch blood glucose range is generally >200 mg/dl. Her dinner blood glucose range is generally >200 mg/dl. Her bedtime blood glucose range is generally >200  mg/dl. Her overall blood glucose range is >200 mg/dl. (She received her Dexcom CGM.  She is not sharing data with clinic.  She denies hypoglycemia.  Her average blood glucose is still high at 215 mg per DL for the last 14 days.  Her point-of-care A1c is 9.4%, improved from 12.2% during her last visit.     ) An ACE inhibitor/angiotensin II receptor blocker is being taken.  Hyperlipidemia This is a chronic problem. The current episode started more than 1 year ago. The problem is controlled. Exacerbating diseases include diabetes and obesity. Pertinent negatives include no chest pain, myalgias or shortness of breath. Current antihyperlipidemic treatment includes statins. Risk factors for coronary artery disease include diabetes mellitus, dyslipidemia, family history, hypertension, obesity, a sedentary lifestyle and post-menopausal.  Hypertension This is a chronic problem. The current episode started more than 1 year ago. Pertinent negatives include no chest pain, headaches, palpitations or shortness of breath. Risk factors for coronary artery disease include diabetes mellitus, dyslipidemia, obesity, post-menopausal state, smoking/tobacco exposure, sedentary lifestyle and family history. Past treatments include ACE inhibitors.    Review of systems  Constitutional: + Minimally fluctuating body weight,  current  Body mass index is 43.08 kg/m. , no fatigue, no subjective hyperthermia, no  subjective hypothermia    Objective:    BP 130/78   Pulse 72   Ht '5\' 1"'$  (1.549 m)   Wt 228 lb (103.4 kg)   LMP 02/25/2019   BMI 43.08 kg/m   Wt Readings from Last 3 Encounters:  08/21/22 228 lb (103.4 kg)  05/19/22 230 lb 6.4 oz (104.5 kg)  01/06/22 226 lb 6.4 oz (102.7 kg)    Physical Exam- Limited  Constitutional:  Body mass index is 43.08 kg/m. , not in acute distress, normal state of mind    CMP     Component Value Date/Time   NA 141 05/05/2022 1101   K 4.5 05/05/2022 1101   CL 104 05/05/2022 1101   CO2 23 05/05/2022 1101   GLUCOSE 151 (H) 05/05/2022 1101   GLUCOSE 378 (H) 08/05/2021 0531   BUN 13 05/05/2022 1101   CREATININE 0.79 05/05/2022 1101   CALCIUM 10.6 (H) 05/05/2022 1101   PROT 6.7 05/05/2022 1101   ALBUMIN 4.6 05/05/2022 1101   AST 13 05/05/2022 1101   ALT 21 05/05/2022 1101   ALKPHOS 81 05/05/2022 1101   BILITOT <0.2 05/05/2022 1101   GFRNONAA >60 08/05/2021 0531   GFRAA >60 04/21/2019 0854     Diabetic Labs (most recent): Lab Results  Component Value Date   HGBA1C 10.2 (A) 05/19/2022   HGBA1C 11.3 (A) 01/06/2022   HGBA1C 8.4 (H) 08/04/2021   MICROALBUR 122.3 (H) 04/21/2019     Lipid Panel ( most recent) Lipid Panel     Component Value Date/Time   CHOL 116 05/05/2022 1101   TRIG 76 05/05/2022 1101   HDL 45 05/05/2022 1101   CHOLHDL 2.6 05/05/2022 1101   CHOLHDL 3.5 04/21/2019 0855   VLDL 28 04/21/2019 0855   LDLCALC 56 05/05/2022 1101   LABVLDL 15 05/05/2022 1101      Lab Results  Component Value Date   TSH 2.110 05/05/2022   TSH 1.210 11/07/2020   TSH 2.110 01/16/2020   TSH 3.399 03/07/2013   FREET4 1.01 05/05/2022   FREET4 1.13 11/07/2020   FREET4 1.06 01/16/2020     Assessment & Plan:   1. Uncontrolled type 2 diabetes mellitus with hyperglycemia ([place])  - [name] has currently uncontrolled symptomatic  type 2 DM since 62 years of age.  She received her Dexcom CGM.  She is not sharing data with clinic.   She denies hypoglycemia.  Her average blood glucose is still high at 215 mg per DL for the last 14 days.  Her point-of-care A1c is 9.4%, improved from 12.2% during her last visit.    - I had a long discussion with her about the progressive nature of diabetes and the pathology behind its complications. -her diabetes is complicated by obesity, non- engagement for management plan,  history of smoking and she remains at a high risk for more acute and chronic complications which include CAD, CVA, CKD, retinopathy, and neuropathy. These are all discussed in detail with her.  - I have counseled her on diet  and weight management  by adopting a carbohydrate restricted/protein rich diet. Patient is encouraged to switch to  unprocessed or minimally processed     complex starch and increased protein intake (animal or plant source), fruits, and vegetables. -  she is advised to stick to a routine mealtimes to eat 3 meals  a day and avoid unnecessary snacks ( to snack only to correct hypoglycemia).   - she acknowledges that there is a room for improvement in her food and drink choices. - Suggestion is made for her to avoid simple carbohydrates  from her diet including Cakes, Sweet Desserts, Ice Cream, Soda (diet and regular), Sweet Tea, Candies, Chips, Cookies, Store Bought Juices, Alcohol , Artificial Sweeteners,  Coffee Creamer, and "Sugar-free" Products, Lemonade. This will help patient to have more stable blood glucose profile and potentially avoid unintended weight gain.  The following Lifestyle Medicine recommendations according to [place]  [facility]) were discussed and and offered to patient and she  agrees to start the journey:  A. Whole Foods, Plant-Based Nutrition comprising of fruits and vegetables, plant-based proteins, whole-grain carbohydrates was discussed in detail with the patient.   A list for source of those nutrients were also provided to the patient.  Patient will use  only water or unsweetened tea for hydration. B.  The need to stay away from risky substances including alcohol, smoking; obtaining 7 to 9 hours of restorative sleep, at least 150 minutes of moderate intensity exercise weekly, the importance of healthy social connections,  and stress management techniques were discussed. C.  A full color page of  Calorie density of various food groups per pound showing examples of each food groups was provided to the patient.   - she has been scheduled with Jearld Fenton, RDN, CDE for diabetes education.  - I have approached her with the following individualized plan to manage  her diabetes and patient agrees:    -[name] has engaged better.  She has a CGM now.   In light of her presentation with inadequate improvement in her glycemic profile, she will be considered for MDI.  After she finishes her current supplies of Toujeo using 110 units nightly, she will be switched to premixed insulin NovoLog 70/30 60 units twice daily with breakfast and supper for Premeal blood glucose readings above 90 mg per DL.  She is encouraged to continue to use her CGM continuously.    -  patient is advised to call clinic for blood glucose readings above 200 or less than 70 mg per DL.   - she is advised to continue Metformin 500 mg p.o. twice daily, glipizide 5 mg XL p.o. daily at breakfast.    - Specific  targets for  A1c;  LDL, HDL,  and Triglycerides were discussed with the patient.  2) Blood Pressure /Hypertension:   -Her blood pressure is controlled to target.  She has amlodipine 5 mg p.o. daily, Hyzaar, losartan/HCTZ.   The above described WF PB diet will help with hypertension and hyperlipidemia as well.  The most likely reason for uncontrolled hypertension is her nonadherence to medications as well as lifestyle recommendations.   3) Lipids/Hyperlipidemia:   Review of her recent lipid panel showed  controlled  LDL at 59.  She is advised to continue Crestor 20 mg p.o.  daily at bedtime.    Side effects and precautions discussed with her.    4)  Weight/Diet: Her BMI is [id number]   clearly complicating her diabetes care.   she is  a candidate for modest weight loss. I discussed with her the fact that loss of 5 - 10% of her  current body weight will have the most impact on her diabetes management.  Exercise, and detailed carbohydrates information provided  -  detailed on discharge instructions.  5) Chronic Care/Health Maintenance:  -she  is on ACEI/ARB and Statin medications and  is encouraged to initiate and continue to follow up with Ophthalmology, Dentist,  Podiatrist at least yearly or according to recommendations, and advised to  stay away from smoking. I have recommended yearly flu vaccine and pneumonia vaccine at least every 5 years; moderate intensity exercise for up to 150 minutes weekly; and  sleep for at least 7 hours a day.  Her ABI was recently normal on April 26, 2020.  - she is  advised to maintain close follow up with Carrolyn Meiers, MD for primary care needs, as well as her other providers for optimal and coordinated care.    I spent  26  minutes in the care of the patient today including review of labs from [place], Lipids, Thyroid Function, Hematology (current and previous including abstractions from other facilities); face-to-face time discussing  her blood glucose readings/logs, discussing hypoglycemia and hyperglycemia episodes and symptoms, medications doses, her options of short and long term treatment based on the latest standards of care / guidelines;  discussion about incorporating lifestyle medicine;  and documenting the encounter. Risk reduction counseling performed per USPSTF guidelines to reduce  obesity and cardiovascular risk factors.     Please refer to Patient Instructions for Blood Glucose Monitoring and Insulin/Medications Dosing Guide"  in media tab for additional information. Please  also refer to " Patient Self Inventory"  in the Media  tab for reviewed elements of pertinent patient history.  [name] participated in the discussions, expressed understanding, and voiced agreement with the above plans.  All questions were answered to her satisfaction. she is encouraged to contact clinic should she have any questions or concerns prior to her return visit.       Follow up plan: - No follow-ups on file.  Glade Lloyd, MD [facility] [address] [place], [place] [address] Phone: [phone]  Fax: [phone]    08/21/2022, 4:54 PM  This note was partially dictated with voice recognition software. Similar sounding words can be transcribed inadequately or may not  be corrected upon review.

## 2022-08-21 NOTE — Patient Instructions (Signed)

## 2022-09-18 ENCOUNTER — Telehealth: Payer: Self-pay | Admitting: "Endocrinology

## 2022-09-18 DIAGNOSIS — E1165 Type 2 diabetes mellitus with hyperglycemia: Secondary | ICD-10-CM | POA: Diagnosis not present

## 2022-09-18 DIAGNOSIS — J45901 Unspecified asthma with (acute) exacerbation: Secondary | ICD-10-CM | POA: Diagnosis not present

## 2022-09-18 DIAGNOSIS — N399 Disorder of urinary system, unspecified: Secondary | ICD-10-CM | POA: Diagnosis not present

## 2022-09-18 DIAGNOSIS — I10 Essential (primary) hypertension: Secondary | ICD-10-CM | POA: Diagnosis not present

## 2022-09-18 DIAGNOSIS — N390 Urinary tract infection, site not specified: Secondary | ICD-10-CM | POA: Diagnosis not present

## 2022-09-18 MED ORDER — NOVOLOG MIX 70/30 FLEXPEN (70-30) 100 UNIT/ML ~~LOC~~ SUPN: 60.0000 [IU] | PEN_INJECTOR | Freq: Two times a day (BID) | SUBCUTANEOUS | 1 refills | Status: DC

## 2022-09-18 NOTE — Telephone Encounter (Signed)
Rx sent to [facility] per pt request. 

## 2022-09-18 NOTE — Telephone Encounter (Signed)
Please switch patient's Novolog to [facility] [place]. Thank you!

## 2022-10-08 ENCOUNTER — Telehealth: Payer: Self-pay

## 2022-10-08 NOTE — Telephone Encounter (Signed)
Left a message requesting pt return call to the office. 

## 2022-10-09 ENCOUNTER — Telehealth: Payer: Self-pay

## 2022-10-09 NOTE — Telephone Encounter (Signed)
Printed pt's data from [place] CGM, gave information to Dr.Nida. Spoke to pt advising her that after Dr.Nida's evaluation of her CGM data she needs to increase her Novolog 70/30 to 70 units daily at breakfast and 60 units daily at supper per Dr.Nida's orders. Pt voiced understanding.

## 2022-10-09 NOTE — Telephone Encounter (Signed)
Left a message requesting pt return call to the office. 

## 2022-10-10 ENCOUNTER — Other Ambulatory Visit: Payer: Self-pay | Admitting: "Endocrinology

## 2022-10-13 ENCOUNTER — Other Ambulatory Visit: Payer: Self-pay | Admitting: "Endocrinology

## 2022-10-25 ENCOUNTER — Other Ambulatory Visit: Payer: Self-pay | Admitting: "Endocrinology

## 2022-10-25 DIAGNOSIS — E1165 Type 2 diabetes mellitus with hyperglycemia: Secondary | ICD-10-CM

## 2022-10-27 ENCOUNTER — Other Ambulatory Visit: Payer: Self-pay | Admitting: "Endocrinology

## 2022-10-27 DIAGNOSIS — E1165 Type 2 diabetes mellitus with hyperglycemia: Secondary | ICD-10-CM

## 2022-10-31 DIAGNOSIS — Z0001 Encounter for general adult medical examination with abnormal findings: Secondary | ICD-10-CM | POA: Diagnosis not present

## 2022-10-31 DIAGNOSIS — R2243 Localized swelling, mass and lump, lower limb, bilateral: Secondary | ICD-10-CM | POA: Diagnosis not present

## 2022-10-31 DIAGNOSIS — J4530 Mild persistent asthma, uncomplicated: Secondary | ICD-10-CM | POA: Diagnosis not present

## 2022-10-31 DIAGNOSIS — E1165 Type 2 diabetes mellitus with hyperglycemia: Secondary | ICD-10-CM | POA: Diagnosis not present

## 2022-12-14 ENCOUNTER — Other Ambulatory Visit: Payer: Self-pay | Admitting: "Endocrinology

## 2022-12-17 ENCOUNTER — Ambulatory Visit: Payer: BC Managed Care – PPO | Admitting: "Endocrinology

## 2023-01-10 ENCOUNTER — Other Ambulatory Visit: Payer: Self-pay | Admitting: "Endocrinology

## 2023-01-22 DIAGNOSIS — E1165 Type 2 diabetes mellitus with hyperglycemia: Secondary | ICD-10-CM | POA: Diagnosis not present

## 2023-01-22 DIAGNOSIS — E1142 Type 2 diabetes mellitus with diabetic polyneuropathy: Secondary | ICD-10-CM | POA: Diagnosis not present

## 2023-01-22 DIAGNOSIS — Z0001 Encounter for general adult medical examination with abnormal findings: Secondary | ICD-10-CM | POA: Diagnosis not present

## 2023-01-22 DIAGNOSIS — I700 Atherosclerosis of aorta: Secondary | ICD-10-CM | POA: Diagnosis not present

## 2023-01-27 ENCOUNTER — Encounter: Payer: Self-pay | Admitting: *Deleted

## 2023-02-02 DIAGNOSIS — E1142 Type 2 diabetes mellitus with diabetic polyneuropathy: Secondary | ICD-10-CM | POA: Diagnosis not present

## 2023-02-02 DIAGNOSIS — I10 Essential (primary) hypertension: Secondary | ICD-10-CM | POA: Diagnosis not present

## 2023-02-02 DIAGNOSIS — E559 Vitamin D deficiency, unspecified: Secondary | ICD-10-CM | POA: Diagnosis not present

## 2023-02-02 DIAGNOSIS — E1165 Type 2 diabetes mellitus with hyperglycemia: Secondary | ICD-10-CM | POA: Diagnosis not present

## 2023-02-05 ENCOUNTER — Other Ambulatory Visit: Payer: Self-pay

## 2023-02-05 DIAGNOSIS — E1165 Type 2 diabetes mellitus with hyperglycemia: Secondary | ICD-10-CM

## 2023-02-05 MED ORDER — NOVOLOG MIX 70/30 FLEXPEN (70-30) 100 UNIT/ML ~~LOC~~ SUPN
PEN_INJECTOR | SUBCUTANEOUS | 0 refills | Status: DC
Start: 2023-02-05 — End: 2023-02-11

## 2023-02-11 ENCOUNTER — Other Ambulatory Visit: Payer: Self-pay

## 2023-02-11 ENCOUNTER — Other Ambulatory Visit: Payer: Self-pay | Admitting: "Endocrinology

## 2023-02-11 DIAGNOSIS — E1165 Type 2 diabetes mellitus with hyperglycemia: Secondary | ICD-10-CM

## 2023-02-11 MED ORDER — NOVOLOG MIX 70/30 FLEXPEN (70-30) 100 UNIT/ML ~~LOC~~ SUPN
PEN_INJECTOR | SUBCUTANEOUS | 0 refills | Status: DC
Start: 2023-02-11 — End: 2023-03-02

## 2023-02-11 NOTE — Telephone Encounter (Signed)
Pt brought her Dexcom G7 CGM reader to upload. Data uploaded and evaluated by Dr.Nida. Pt advised to decrease her Novolog 70/30 to 60 units bid per Dr.Nida's orders. Rx for Novolog 70/30 60  units bid sent to [facility] and [facility].

## 2023-02-23 DIAGNOSIS — E1165 Type 2 diabetes mellitus with hyperglycemia: Secondary | ICD-10-CM | POA: Diagnosis not present

## 2023-03-02 ENCOUNTER — Ambulatory Visit (INDEPENDENT_AMBULATORY_CARE_PROVIDER_SITE_OTHER): Payer: BC Managed Care – PPO | Admitting: "Endocrinology

## 2023-03-02 ENCOUNTER — Encounter: Payer: Self-pay | Admitting: "Endocrinology

## 2023-03-02 VITALS — BP 126/78 | HR 76 | Ht 61.0 in | Wt 230.2 lb

## 2023-03-02 DIAGNOSIS — E1165 Type 2 diabetes mellitus with hyperglycemia: Secondary | ICD-10-CM

## 2023-03-02 DIAGNOSIS — E782 Mixed hyperlipidemia: Secondary | ICD-10-CM | POA: Diagnosis not present

## 2023-03-02 DIAGNOSIS — I10 Essential (primary) hypertension: Secondary | ICD-10-CM | POA: Diagnosis not present

## 2023-03-02 DIAGNOSIS — Z794 Long term (current) use of insulin: Secondary | ICD-10-CM | POA: Diagnosis not present

## 2023-03-02 LAB — POCT GLYCOSYLATED HEMOGLOBIN (HGB A1C): HbA1c, POC (controlled diabetic range): 8.3 % — AB (ref 0.0–7.0)

## 2023-03-02 MED ORDER — NOVOLOG MIX 70/30 FLEXPEN (70-30) 100 UNIT/ML ~~LOC~~ SUPN: PEN_INJECTOR | SUBCUTANEOUS | 0 refills | Status: DC

## 2023-03-02 MED ORDER — EMPAGLIFLOZIN 10 MG PO TABS: 10.0000 mg | ORAL_TABLET | Freq: Every day | ORAL | 1 refills | Status: DC

## 2023-03-02 NOTE — Patient Instructions (Signed)

## 2023-03-02 NOTE — Progress Notes (Signed)
03/02/2023, 4:29 PM   Endocrinology follow-up note   Subjective:    Patient ID: [name], female    DOB: 08/07/1960.  [name] is being seen in follow-up in the management of her currently uncontrolled type 2 diabetes, hypertension. AVW:[id number], Wayland Salinas, MD.   Past Medical History:  Diagnosis Date   Allergy    Asthma    COPD (chronic obstructive pulmonary disease) (HCC)    Diabetes mellitus without complication (HCC)    Hypertension    Morbid obesity due to excess calories (HCC)    Umbilical hernia     Past Surgical History:  Procedure Laterality Date   CARPAL TUNNEL RELEASE Right    CHOLECYSTECTOMY     COLONOSCOPY N/A 01/12/2015   Procedure: COLONOSCOPY;  Surgeon: West Bali, MD;  Location: [facility];  Service: Endoscopy;  Laterality: N/A;  0830   HYSTEROSCOPY WITH D & C N/A 04/01/2013   Procedure: DILATATION AND CURETTAGE /HYSTEROSCOPY;  Surgeon: Lazaro Arms, MD;  Location: [facility];  Service: Gynecology;  Laterality: N/A;   NASAL POLYP SURGERY      Social History   Socioeconomic History   Marital status: Married    Spouse name: Not on file   Number of children: 2   Years of education: Not on file   Highest education level: Not on file  Occupational History   Not on file  Tobacco Use   Smoking status: Former    Current packs/day: 0.00    Average packs/day: 0.5 packs/day for 20.0 years (10.0 ttl pk-yrs)    Types: Cigarettes    Start date: 03/25/1977    Quit date: 03/25/1997    Years since quitting: 25.9   Smokeless tobacco: Never  Vaping Use   Vaping status: Never Used  Substance and Sexual Activity   Alcohol use: Yes    Alcohol/week: 0.0 standard drinks of alcohol    Comment: Ocassional   Drug use: No   Sexual activity: Yes    Birth control/protection: None, Post-menopausal  Other Topics Concern   Not on file  Social History Narrative   Not on file    Social Determinants of Health   Financial Resource Strain: Not on file  Food Insecurity: Not on file  Transportation Needs: Not on file  Physical Activity: Not on file  Stress: Not on file  Social Connections: Not on file    Family History  Problem Relation Age of Onset   Diabetes Mother    Kidney disease Mother    Other Mother        renal failure   Hypertension Mother    Cancer Mother        ?uterine cancer    Diabetes Father    Cancer Father        pancreatic   Diabetes Sister    Kidney disease Sister    Diabetes Maternal Grandmother    Asthma Maternal Grandmother    COPD Maternal Grandmother        bronchitis   Hypertension Maternal Grandmother    Thyroid disease Maternal Grandmother    Hypertension Daughter    Colon cancer Neg Hx  Outpatient Encounter Medications as of 03/02/2023  Medication Sig   empagliflozin (JARDIANCE) 10 MG TABS tablet Take 1 tablet (10 mg total) by mouth daily before breakfast.   acetaZOLAMIDE (DIAMOX) 250 MG tablet Take 250 mg by mouth 3 (three) times daily.   aspirin 81 MG EC tablet Take 1 tablet by mouth daily.   Continuous Blood Gluc Sensor (DEXCOM G7 SENSOR) MISC Change sensor every 10 days. Use to check glucose as directed.   Continuous Glucose Sensor (DEXCOM G7 SENSOR) MISC CHANGE THE SENSOR EVERY 10 DAYS   diclofenac sodium (VOLTAREN) 1 % GEL 4 (four) times daily as needed.   fluticasone (FLONASE) 50 MCG/ACT nasal spray Place 2 sprays into both nostrils daily.   Fluticasone-Salmeterol (ADVAIR) 250-50 MCG/DOSE AEPB Inhale 1 puff into the lungs 2 (two) times daily.    gabapentin (NEURONTIN) 100 MG capsule Take 1 capsule by mouth 2 (two) times daily.   insulin aspart protamine - aspart (NOVOLOG MIX 70/30 FLEXPEN) (70-30) 100 UNIT/ML FlexPen INJECT 70 UNITS AT BREAKFAST AND 70 UNITS AT SUPPER DAILY AS DIRECTED.   Insulin Pen Needle (B-D UF III MINI PEN NEEDLES) 31G X 5 MM MISC USE ONCE DAILY AS DIRECTED   ipratropium (ATROVENT) 0.06  % nasal spray Place 2 sprays into both nostrils 2 (two) times daily.   Ipratropium-Albuterol (COMBIVENT RESPIMAT IN) Inhale 1 puff into the lungs daily as needed (shortness of breath).   ipratropium-albuterol (DUONEB) 0.5-2.5 (3) MG/3ML SOLN Take 3 mLs by nebulization every 4 (four) hours as needed (Shortness of Breath).    losartan-hydrochlorothiazide (HYZAAR) 100-25 MG tablet Take 1 tablet by mouth daily.   metFORMIN (GLUCOPHAGE) 500 MG tablet TAKE 1 TABLET BY MOUTH TWICE A DAY WITH FOOD   Multiple Vitamin (MULTIVITAMIN WITH MINERALS) TABS tablet Take 1 tablet by mouth daily.   rosuvastatin (CRESTOR) 20 MG tablet Take 0.5 tablets (10 mg total) by mouth daily.   traMADol (ULTRAM) 50 MG tablet Take 50 mg by mouth 2 (two) times daily.   [DISCONTINUED] amLODipine (NORVASC) 5 MG tablet Take 1 tablet by mouth daily.   [DISCONTINUED] glipiZIDE (GLUCOTROL XL) 5 MG 24 hr tablet TAKE 1 TABLET BY MOUTH EVERY DAY WITH BREAKFAST   [DISCONTINUED] insulin aspart protamine - aspart (NOVOLOG MIX 70/30 FLEXPEN) (70-30) 100 UNIT/ML FlexPen INJECT 60 UNITS AT BREAKFAST AND 60 UNITS AT SUPPER DAILY AS DIRECTED.   No facility-administered encounter medications on file as of 03/02/2023.    ALLERGIES: No Known Allergies  VACCINATION STATUS: Immunization History  Administered Date(s) Administered   Influenza-Unspecified 06/09/2010, 06/09/2013   Pneumococcal Polysaccharide-23 08/05/2021    Diabetes She presents for her follow-up diabetic visit. She has type 2 diabetes mellitus. Onset time: She was diagnosed at approximate age of 40 years. Her disease course has been improving. There are no hypoglycemic associated symptoms. Pertinent negatives for hypoglycemia include no confusion, headaches, pallor or seizures. Pertinent negatives for diabetes include no chest pain, no fatigue, no polydipsia, no polyphagia and no polyuria. There are no hypoglycemic complications. Symptoms are improving. There are no diabetic  complications. Risk factors for coronary artery disease include diabetes mellitus, dyslipidemia, family history, obesity, tobacco exposure, sedentary lifestyle, post-menopausal and hypertension. Current diabetic treatment includes insulin injections. Her weight is fluctuating minimally. She is following a generally unhealthy diet. When asked about meal planning, she reported none. She has not had a previous visit with a dietitian. She rarely participates in exercise. Her home blood glucose trend is decreasing steadily. Her breakfast blood glucose range is generally  180-200 mg/dl. Her lunch blood glucose range is generally 180-200 mg/dl. Her dinner blood glucose range is generally 180-200 mg/dl. Her bedtime blood glucose range is generally 180-200 mg/dl. Her overall blood glucose range is 180-200 mg/dl. (She proved glycemic profile.  Her Dexcom AGP shows 47% time in range, 32% level 1 hyperglycemia, 21% level 2 hyperglycemia.  She has not documented or reports any hypoglycemia.  Her point-of-care A1c is 8.3% improving from 12.2%.      ) An ACE inhibitor/angiotensin II receptor blocker is being taken.  Hyperlipidemia This is a chronic problem. The current episode started more than 1 year ago. The problem is controlled. Exacerbating diseases include diabetes and obesity. Pertinent negatives include no chest pain, myalgias or shortness of breath. Current antihyperlipidemic treatment includes statins. Risk factors for coronary artery disease include diabetes mellitus, dyslipidemia, family history, hypertension, obesity, a sedentary lifestyle and post-menopausal.  Hypertension This is a chronic problem. The current episode started more than 1 year ago. Pertinent negatives include no chest pain, headaches, palpitations or shortness of breath. Risk factors for coronary artery disease include diabetes mellitus, dyslipidemia, obesity, post-menopausal state, smoking/tobacco exposure, sedentary lifestyle and family  history. Past treatments include ACE inhibitors.    Review of systems  Constitutional: + Minimally fluctuating body weight,  current  Body mass index is 43.5 kg/m. , no fatigue, no subjective hyperthermia, no subjective hypothermia    Objective:    BP 126/78   Pulse 76   Ht 5\' 1"  (1.549 m)   Wt 230 lb 3.2 oz (104.4 kg)   LMP 02/25/2019   BMI 43.50 kg/m   Wt Readings from Last 3 Encounters:  03/02/23 230 lb 3.2 oz (104.4 kg)  08/21/22 228 lb (103.4 kg)  05/19/22 230 lb 6.4 oz (104.5 kg)    Physical Exam- Limited  Constitutional:  Body mass index is 43.5 kg/m. , not in acute distress, normal state of mind    CMP     Component Value Date/Time   NA 144 02/23/2023 1603   K 4.4 02/23/2023 1603   CL 106 02/23/2023 1603   CO2 23 02/23/2023 1603   GLUCOSE 149 (H) 02/23/2023 1603   GLUCOSE 378 (H) 08/05/2021 0531   BUN 16 02/23/2023 1603   CREATININE 0.89 02/23/2023 1603   CALCIUM 10.1 02/23/2023 1603   PROT 6.7 02/23/2023 1603   ALBUMIN 4.2 02/23/2023 1603   AST 16 02/23/2023 1603   ALT 20 02/23/2023 1603   ALKPHOS 83 02/23/2023 1603   BILITOT <0.2 02/23/2023 1603   GFRNONAA >60 08/05/2021 0531   GFRAA >60 04/21/2019 0854     Diabetic Labs (most recent): Lab Results  Component Value Date   HGBA1C 8.3 (A) 03/02/2023   HGBA1C 10.2 (A) 05/19/2022   HGBA1C 11.3 (A) 01/06/2022   MICROALBUR 122.3 (H) 04/21/2019     Lipid Panel ( most recent) Lipid Panel     Component Value Date/Time   CHOL 116 05/05/2022 1101   TRIG 76 05/05/2022 1101   HDL 45 05/05/2022 1101   CHOLHDL 2.6 05/05/2022 1101   CHOLHDL 3.5 04/21/2019 0855   VLDL 28 04/21/2019 0855   LDLCALC 56 05/05/2022 1101   LABVLDL 15 05/05/2022 1101      Lab Results  Component Value Date   TSH 2.110 05/05/2022   TSH 1.210 11/07/2020   TSH 2.110 01/16/2020   TSH 3.399 03/07/2013   FREET4 1.01 05/05/2022   FREET4 1.13 11/07/2020   FREET4 1.06 01/16/2020     Assessment &  Plan:   1.  Uncontrolled type 2 diabetes mellitus with hyperglycemia (HCC)  - [name] has currently uncontrolled symptomatic type 2 DM since 62 years of age.  She proved glycemic profile.  Her Dexcom AGP shows 47% time in range, 32% level 1 hyperglycemia, 21% level 2 hyperglycemia.  She has not documented or reports any hypoglycemia.  Her point-of-care A1c is 8.3% improving from 12.2%.    - I had a long discussion with her about the progressive nature of diabetes and the pathology behind its complications. -her diabetes is complicated by obesity, non- engagement for management plan,  history of smoking and she remains at a high risk for more acute and chronic complications which include CAD, CVA, CKD, retinopathy, and neuropathy. These are all discussed in detail with her.  - I have counseled her on diet  and weight management  by adopting a carbohydrate restricted/protein rich diet. Patient is encouraged to switch to  unprocessed or minimally processed     complex starch and increased protein intake (animal or plant source), fruits, and vegetables. -  she is advised to stick to a routine mealtimes to eat 3 meals  a day and avoid unnecessary snacks ( to snack only to correct hypoglycemia).  - she acknowledges that there is a room for improvement in her food and drink choices. - Suggestion is made for her to avoid simple carbohydrates  from her diet including Cakes, Sweet Desserts, Ice Cream, Soda (diet and regular), Sweet Tea, Candies, Chips, Cookies, Store Bought Juices, Alcohol , Artificial Sweeteners,  Coffee Creamer, and "Sugar-free" Products, Lemonade. This will help patient to have more stable blood glucose profile and potentially avoid unintended weight gain.  The following Lifestyle Medicine recommendations according to American College of Lifestyle Medicine  [facility]) were discussed and and offered to patient and she  agrees to start the journey:  A. Whole Foods, Plant-Based Nutrition comprising of  fruits and vegetables, plant-based proteins, whole-grain carbohydrates was discussed in detail with the patient.   A list for source of those nutrients were also provided to the patient.  Patient will use only water or unsweetened tea for hydration. B.  The need to stay away from risky substances including alcohol, smoking; obtaining 7 to 9 hours of restorative sleep, at least 150 minutes of moderate intensity exercise weekly, the importance of healthy social connections,  and stress management techniques were discussed. C.  A full color page of  Calorie density of various food groups per pound showing examples of each food groups was provided to the patient.   - she has been scheduled with Norm Salt, RDN, CDE for diabetes education.  - I have approached her with the following individualized plan to manage  her diabetes and patient agrees:    -[name] has engaged better.  She is utilizing her CGM optimally. -She is advised to increase her NovoLog 70/30 to 70 units with breakfast and 70 units with supper for Premeal blood glucose readings above 90 mg per DL.  She is encouraged to continue to use her CGM continuously.    -She is advised to continue metformin 500 mg p.o. twice daily.  At this point, she would benefit from Jardiance instead of glipizide.  I discussed and prescribed Jardiance 10 mg p.o. daily at breakfast.  Side effects and precautions discussed with her.   - Specific targets for  A1c;  LDL, HDL,  and Triglycerides were discussed with the patient.  2) Blood Pressure /Hypertension:   -  Her blood pressure is controlled to target.  She has amlodipine 5 mg p.o. daily, Hyzaar, losartan/HCTZ.   The above described WF PB diet will help with hypertension and hyperlipidemia as well.  The most likely reason for uncontrolled hypertension is her nonadherence to medications as well as lifestyle recommendations.   3) Lipids/Hyperlipidemia:   Review of her recent lipid panel showed   controlled  LDL at 56.  She is advised to continue Crestor 20 mg p.o. daily at bedtime.      Side effects and precautions discussed with her.    4)  Weight/Diet: Her BMI is 43.50--   clearly complicating her diabetes care.   she is  a candidate for modest weight loss. I discussed with her the fact that loss of 5 - 10% of her  current body weight will have the most impact on her diabetes management.  Exercise, and detailed carbohydrates information provided  -  detailed on discharge instructions.  5) Chronic Care/Health Maintenance:  -she  is on ACEI/ARB and Statin medications and  is encouraged to initiate and continue to follow up with Ophthalmology, Dentist,  Podiatrist at least yearly or according to recommendations, and advised to  stay away from smoking. I have recommended yearly flu vaccine and pneumonia vaccine at least every 5 years; moderate intensity exercise for up to 150 minutes weekly; and  sleep for at least 7 hours a day.  Her ABI was recently normal on April 26, 2020.  - she is  advised to maintain close follow up with Benetta Spar, MD for primary care needs, as well as her other providers for optimal and coordinated care.   I spent  26  minutes in the care of the patient today including review of labs from CMP, Lipids, Thyroid Function, Hematology (current and previous including abstractions from other facilities); face-to-face time discussing  her blood glucose readings/logs, discussing hypoglycemia and hyperglycemia episodes and symptoms, medications doses, her options of short and long term treatment based on the latest standards of care / guidelines;  discussion about incorporating lifestyle medicine;  and documenting the encounter. Risk reduction counseling performed per USPSTF guidelines to reduce  obesity and cardiovascular risk factors.     Please refer to Patient Instructions for Blood Glucose Monitoring and Insulin/Medications Dosing Guide"  in media tab for  additional information. Please  also refer to " Patient Self Inventory" in the Media  tab for reviewed elements of pertinent patient history.  [name] participated in the discussions, expressed understanding, and voiced agreement with the above plans.  All questions were answered to her satisfaction. she is encouraged to contact clinic should she have any questions or concerns prior to her return visit.      Follow up plan: - Return in about 4 months (around 07/02/2023) for Bring Meter/CGM Device/Logs- A1c in Office.  Marquis Lunch, MD [facility] [address] [place], [place] [address] Phone: [phone]  Fax: [phone]    03/02/2023, 4:29 PM  This note was partially dictated with voice recognition software. Similar sounding words can be transcribed inadequately or may not  be corrected upon review.

## 2023-03-03 ENCOUNTER — Other Ambulatory Visit: Payer: Self-pay | Admitting: "Endocrinology

## 2023-03-03 DIAGNOSIS — E1165 Type 2 diabetes mellitus with hyperglycemia: Secondary | ICD-10-CM

## 2023-03-23 ENCOUNTER — Other Ambulatory Visit: Payer: Self-pay | Admitting: "Endocrinology

## 2023-04-08 ENCOUNTER — Other Ambulatory Visit: Payer: Self-pay | Admitting: "Endocrinology

## 2023-04-12 ENCOUNTER — Other Ambulatory Visit: Payer: Self-pay | Admitting: "Endocrinology

## 2023-04-16 ENCOUNTER — Telehealth: Payer: Self-pay | Admitting: "Endocrinology

## 2023-04-16 ENCOUNTER — Other Ambulatory Visit: Payer: Self-pay | Admitting: "Endocrinology

## 2023-04-16 MED ORDER — FLUCONAZOLE 150 MG PO TABS: 150.0000 mg | ORAL_TABLET | Freq: Once | ORAL | 0 refills | Status: AC

## 2023-04-16 NOTE — Telephone Encounter (Signed)
Pt states Jardiance has given her a yeast infection and she stated if that happened to let you know so you can call something in.  Pt picks up prescriptions at [facility] in [place]

## 2023-04-23 DIAGNOSIS — J4530 Mild persistent asthma, uncomplicated: Secondary | ICD-10-CM | POA: Diagnosis not present

## 2023-04-23 DIAGNOSIS — Z23 Encounter for immunization: Secondary | ICD-10-CM | POA: Diagnosis not present

## 2023-04-23 DIAGNOSIS — E1165 Type 2 diabetes mellitus with hyperglycemia: Secondary | ICD-10-CM | POA: Diagnosis not present

## 2023-05-22 ENCOUNTER — Telehealth: Payer: Self-pay

## 2023-05-22 NOTE — Telephone Encounter (Signed)
Left a message requesting pt return call to the office. 

## 2023-05-24 ENCOUNTER — Other Ambulatory Visit: Payer: Self-pay | Admitting: "Endocrinology

## 2023-05-25 ENCOUNTER — Telehealth: Payer: Self-pay

## 2023-05-25 ENCOUNTER — Other Ambulatory Visit: Payer: Self-pay | Admitting: "Endocrinology

## 2023-05-25 MED ORDER — FLUCONAZOLE 150 MG PO TABS: 150.0000 mg | ORAL_TABLET | Freq: Once | ORAL | 0 refills | Status: AC

## 2023-05-25 NOTE — Telephone Encounter (Signed)
Pt called stating she's experiencing symptoms of a yeast infection. States she is taking Jardiance 10mg  and it is working well and does not want to discontinue taking it. Requested a Rx for diflucan.

## 2023-05-25 NOTE — Telephone Encounter (Signed)
Left a message requesting pt to return call to the office. 

## 2023-05-29 ENCOUNTER — Other Ambulatory Visit: Payer: Self-pay | Admitting: "Endocrinology

## 2023-05-29 MED ORDER — FLUCONAZOLE 150 MG PO TABS: 150.0000 mg | ORAL_TABLET | Freq: Once | ORAL | 0 refills | Status: AC

## 2023-05-29 NOTE — Telephone Encounter (Signed)
Pt called back regarding Rx for diflucan. Uses [facility] in [place].

## 2023-06-01 NOTE — Telephone Encounter (Signed)
Noted  

## 2023-06-09 ENCOUNTER — Other Ambulatory Visit: Payer: Self-pay | Admitting: "Endocrinology

## 2023-06-09 DIAGNOSIS — E1165 Type 2 diabetes mellitus with hyperglycemia: Secondary | ICD-10-CM

## 2023-06-17 ENCOUNTER — Inpatient Hospital Stay (HOSPITAL_COMMUNITY)
Admission: EM | Admit: 2023-06-17 | Discharge: 2023-06-19 | DRG: 871 | Disposition: A | Discharge status: Home / Self Care | Payer: BC Managed Care – PPO | Attending: Family Medicine | Admitting: Family Medicine

## 2023-06-17 ENCOUNTER — Encounter (HOSPITAL_COMMUNITY): Payer: Self-pay

## 2023-06-17 ENCOUNTER — Other Ambulatory Visit: Payer: Self-pay

## 2023-06-17 ENCOUNTER — Emergency Department (HOSPITAL_COMMUNITY): Payer: BC Managed Care – PPO

## 2023-06-17 DIAGNOSIS — I10 Essential (primary) hypertension: Secondary | ICD-10-CM | POA: Diagnosis not present

## 2023-06-17 DIAGNOSIS — R0789 Other chest pain: Secondary | ICD-10-CM | POA: Diagnosis not present

## 2023-06-17 DIAGNOSIS — Z7951 Long term (current) use of inhaled steroids: Secondary | ICD-10-CM

## 2023-06-17 DIAGNOSIS — J4489 Other specified chronic obstructive pulmonary disease: Secondary | ICD-10-CM | POA: Diagnosis not present

## 2023-06-17 DIAGNOSIS — Z8249 Family history of ischemic heart disease and other diseases of the circulatory system: Secondary | ICD-10-CM | POA: Diagnosis not present

## 2023-06-17 DIAGNOSIS — Z6841 Body Mass Index (BMI) 40.0 and over, adult: Secondary | ICD-10-CM

## 2023-06-17 DIAGNOSIS — E1165 Type 2 diabetes mellitus with hyperglycemia: Secondary | ICD-10-CM | POA: Diagnosis present

## 2023-06-17 DIAGNOSIS — Z825 Family history of asthma and other chronic lower respiratory diseases: Secondary | ICD-10-CM

## 2023-06-17 DIAGNOSIS — A4189 Other specified sepsis: Secondary | ICD-10-CM | POA: Diagnosis not present

## 2023-06-17 DIAGNOSIS — Z833 Family history of diabetes mellitus: Secondary | ICD-10-CM

## 2023-06-17 DIAGNOSIS — J45909 Unspecified asthma, uncomplicated: Secondary | ICD-10-CM | POA: Diagnosis present

## 2023-06-17 DIAGNOSIS — Z794 Long term (current) use of insulin: Secondary | ICD-10-CM | POA: Diagnosis not present

## 2023-06-17 DIAGNOSIS — D649 Anemia, unspecified: Secondary | ICD-10-CM | POA: Diagnosis not present

## 2023-06-17 DIAGNOSIS — R6510 Systemic inflammatory response syndrome (SIRS) of non-infectious origin without acute organ dysfunction: Secondary | ICD-10-CM | POA: Insufficient documentation

## 2023-06-17 DIAGNOSIS — R0602 Shortness of breath: Secondary | ICD-10-CM | POA: Diagnosis not present

## 2023-06-17 DIAGNOSIS — Z841 Family history of disorders of kidney and ureter: Secondary | ICD-10-CM

## 2023-06-17 DIAGNOSIS — Z1152 Encounter for screening for COVID-19: Secondary | ICD-10-CM

## 2023-06-17 DIAGNOSIS — J9601 Acute respiratory failure with hypoxia: Secondary | ICD-10-CM | POA: Diagnosis not present

## 2023-06-17 DIAGNOSIS — E782 Mixed hyperlipidemia: Secondary | ICD-10-CM | POA: Diagnosis not present

## 2023-06-17 DIAGNOSIS — Z7984 Long term (current) use of oral hypoglycemic drugs: Secondary | ICD-10-CM | POA: Diagnosis not present

## 2023-06-17 DIAGNOSIS — Z87891 Personal history of nicotine dependence: Secondary | ICD-10-CM | POA: Diagnosis not present

## 2023-06-17 DIAGNOSIS — G932 Benign intracranial hypertension: Secondary | ICD-10-CM | POA: Diagnosis not present

## 2023-06-17 DIAGNOSIS — Z7982 Long term (current) use of aspirin: Secondary | ICD-10-CM | POA: Diagnosis not present

## 2023-06-17 DIAGNOSIS — J449 Chronic obstructive pulmonary disease, unspecified: Secondary | ICD-10-CM | POA: Diagnosis not present

## 2023-06-17 DIAGNOSIS — Z79899 Other long term (current) drug therapy: Secondary | ICD-10-CM

## 2023-06-17 DIAGNOSIS — J101 Influenza due to other identified influenza virus with other respiratory manifestations: Principal | ICD-10-CM | POA: Diagnosis present

## 2023-06-17 DIAGNOSIS — R079 Chest pain, unspecified: Secondary | ICD-10-CM | POA: Diagnosis not present

## 2023-06-17 LAB — RESP PANEL BY RT-PCR (RSV, FLU A&B, COVID)  RVPGX2
Influenza A by PCR: POSITIVE — AB
Influenza B by PCR: NEGATIVE
Resp Syncytial Virus by PCR: NEGATIVE
SARS Coronavirus 2 by RT PCR: NEGATIVE

## 2023-06-17 LAB — CBC
HCT: 46.1 % — ABNORMAL HIGH (ref 36.0–46.0)
Hemoglobin: 14.8 g/dL (ref 12.0–15.0)
MCH: 26.7 pg (ref 26.0–34.0)
MCHC: 32.1 g/dL (ref 30.0–36.0)
MCV: 83.2 fL (ref 80.0–100.0)
Platelets: 219 10*3/uL (ref 150–400)
RBC: 5.54 MIL/uL — ABNORMAL HIGH (ref 3.87–5.11)
RDW: 14.7 % (ref 11.5–15.5)
WBC: 8.4 10*3/uL (ref 4.0–10.5)
nRBC: 0 % (ref 0.0–0.2)

## 2023-06-17 LAB — TROPONIN I (HIGH SENSITIVITY)
Troponin I (High Sensitivity): 17 ng/L (ref <18)
Troponin I (High Sensitivity): 17 ng/L (ref <18)

## 2023-06-17 LAB — COMPREHENSIVE METABOLIC PANEL
ALT: 23 U/L (ref 0–44)
AST: 20 U/L (ref 15–41)
Albumin: 3.9 g/dL (ref 3.5–5.0)
Alkaline Phosphatase: 58 U/L (ref 38–126)
Anion gap: 10 (ref 5–15)
BUN: 19 mg/dL (ref 8–23)
CO2: 22 mmol/L (ref 22–32)
Calcium: 9.7 mg/dL (ref 8.9–10.3)
Chloride: 101 mmol/L (ref 98–111)
Creatinine, Ser: 0.9 mg/dL (ref 0.44–1.00)
GFR, Estimated: >60 mL/min (ref >60)
Glucose, Bld: 160 mg/dL — ABNORMAL HIGH (ref 70–99)
Potassium: 3.8 mmol/L (ref 3.5–5.1)
Sodium: 133 mmol/L — ABNORMAL LOW (ref 135–145)
Total Bilirubin: 0.6 mg/dL (ref 0.0–1.2)
Total Protein: 7.4 g/dL (ref 6.5–8.1)

## 2023-06-17 LAB — PROTIME-INR
INR: 1 (ref 0.8–1.2)
Prothrombin Time: 13.5 s (ref 11.4–15.2)

## 2023-06-17 LAB — APTT: aPTT: 28 s (ref 24–36)

## 2023-06-17 LAB — LACTIC ACID, PLASMA: Lactic Acid, Venous: 1.2 mmol/L (ref 0.5–1.9)

## 2023-06-17 MED ORDER — GUAIFENESIN-DM 100-10 MG/5ML PO SYRP
5.0000 mL | ORAL_SOLUTION | Freq: Once | ORAL | Status: AC
  Administered 2023-06-17: 5 mL via ORAL
  Filled 2023-06-17: qty 5

## 2023-06-17 MED ORDER — ACETAMINOPHEN 325 MG PO TABS
650.0000 mg | ORAL_TABLET | Freq: Once | ORAL | Status: AC
  Administered 2023-06-17: 650 mg via ORAL
  Filled 2023-06-17: qty 2

## 2023-06-17 MED ORDER — SODIUM CHLORIDE 0.9 % IV BOLUS: 1000.0000 mL | Freq: Once | INTRAVENOUS | Status: DC

## 2023-06-17 MED ORDER — SODIUM CHLORIDE 0.9 % IV BOLUS
1000.0000 mL | Freq: Once | INTRAVENOUS | Status: AC
  Administered 2023-06-18: 1000 mL via INTRAVENOUS

## 2023-06-17 MED ORDER — EMPAGLIFLOZIN 10 MG PO TABS: 10.0000 mg | ORAL_TABLET | Freq: Every day | ORAL | 0 refills | Status: DC

## 2023-06-17 MED ORDER — IPRATROPIUM-ALBUTEROL 0.5-2.5 (3) MG/3ML IN SOLN
3.0000 mL | RESPIRATORY_TRACT | Status: AC
  Administered 2023-06-17 (×3): 3 mL via RESPIRATORY_TRACT
  Filled 2023-06-17 (×3): qty 3

## 2023-06-17 MED ORDER — OSELTAMIVIR PHOSPHATE 75 MG PO CAPS
75.0000 mg | ORAL_CAPSULE | Freq: Once | ORAL | Status: AC
  Administered 2023-06-17: 75 mg via ORAL
  Filled 2023-06-17: qty 1

## 2023-06-17 NOTE — ED Notes (Signed)
 Pt given 2 -8 oz cups of water for hydration - no need for IV

## 2023-06-17 NOTE — ED Notes (Addendum)
 Pt able to drink- asked Dr Andria Meuse in pt could drink water instead of IV fluids and starting IV- ok for pt to receive fluids PO

## 2023-06-17 NOTE — ED Notes (Signed)
 Pt ambulated on room air with pulse ox remaining 93-95% on room air- EDP Smoot, PA aware.

## 2023-06-17 NOTE — ED Provider Notes (Signed)
 [place] EMERGENCY DEPARTMENT AT [facility] Provider Note   CSN: [id number] Arrival date & time: 06/17/23  1805     History  Chief Complaint  Patient presents with   Chest Pain    Ladene L Fallaw is a 63 y.o. female.  Patient with history of COPD, morbid obesity, diabetes, hypertension presents today with complaints of chest pain, shortness of breath, cough, and congestion. She states that same began 2 days ago and has been persistent since and worsening. She states that she has coughed so much that she now feels soreness in her chest every time she coughs. She denies any specific pain. Does endorse persistent fevers. She has been taking Theraflu with minimal improvement. She no longer smokes.  The history is provided by the patient. No language interpreter was used.  Chest Pain Associated symptoms: cough, fever and shortness of breath        Home Medications Prior to Admission medications   Medication Sig Start Date End Date Taking? Authorizing Provider  acetaZOLAMIDE (DIAMOX) 250 MG tablet Take 250 mg by mouth 3 (three) times daily. 06/06/21  Yes [provider]  aspirin  81 MG EC tablet Take 1 tablet by mouth daily.   Yes [provider]  atenolol  (TENORMIN ) 25 MG tablet Take 25 mg by mouth daily. 04/11/23  Yes [provider]  diclofenac sodium (VOLTAREN) 1 % GEL Apply 2 g topically 4 (four) times daily as needed (pain). 04/03/19  Yes [provider]  empagliflozin  (JARDIANCE ) 10 MG TABS tablet Take 1 tablet (10 mg total) by mouth daily before breakfast. 06/17/23  Yes Nida, Gebreselassie W, MD  fluticasone  (FLONASE ) 50 MCG/ACT nasal spray Place 2 sprays into both nostrils daily. 05/21/21  Yes [provider]  Fluticasone -Salmeterol (ADVAIR) 250-50 MCG/DOSE AEPB Inhale 1 puff into the lungs 2 (two) times daily.    Yes [provider]  insulin  aspart protamine - aspart (NOVOLOG  MIX 70/30 FLEXPEN) (70-30) 100 UNIT/ML  FlexPen INJECT 70 UNITS AT BREAKFAST AND 70 UNITS AT SUPPER DAILY AS DIRECTED. Patient taking differently: Inject 60 Units into the skin 2 (two) times daily with a meal. INJECT 70 UNITS AT BREAKFAST AND 70 UNITS AT SUPPER DAILY AS DIRECTED. 03/02/23  Yes Nida, Gebreselassie W, MD  ipratropium-albuterol  (DUONEB) 0.5-2.5 (3) MG/3ML SOLN Take 3 mLs by nebulization every 4 (four) hours as needed (Shortness of Breath).    Yes [provider]  losartan -hydrochlorothiazide  (HYZAAR) 100-25 MG tablet Take 1 tablet by mouth daily. 10/10/20  Yes [provider]  metFORMIN  (GLUCOPHAGE ) 500 MG tablet TAKE 1 TABLET BY MOUTH TWICE A DAY WITH FOOD 05/25/23  Yes Nida, Gebreselassie W, MD  Multiple Vitamin (MULTIVITAMIN WITH MINERALS) TABS tablet Take 1 tablet by mouth daily.   Yes [provider]  rosuvastatin  (CRESTOR ) 20 MG tablet Take 0.5 tablets (10 mg total) by mouth daily. 06/03/21  Yes Johnson, Clanford L, MD  B-D UF III MINI PEN NEEDLES 31G X 5 MM MISC USE ONCE DAILY AS DIRECTED 03/24/23   Nida, Gebreselassie W, MD  Continuous Blood Gluc Sensor (DEXCOM G7 SENSOR) MISC Change sensor every 10 days. Use to check glucose as directed. 07/10/22   Nida, Gebreselassie W, MD  Continuous Glucose Sensor (DEXCOM G7 SENSOR) MISC CHANGE THE SENSOR EVERY 10 DAYS 06/09/23   Nida, Gebreselassie W, MD      Allergies    Patient has no known allergies.    Review of Systems   Review of Systems  Constitutional:  Positive  for fever.  HENT:  Positive for congestion.   Respiratory:  Positive for cough and shortness of breath.   Cardiovascular:  Positive for chest pain.  All other systems reviewed and are negative.   Physical Exam Updated Vital Signs BP 137/65   Pulse (!) 102   Temp 98.9 F (37.2 C) (Oral)   Resp 20   Ht 5' 1 (1.549 m)   Wt 99.8 kg   LMP 02/25/2019   SpO2 90%   BMI 41.57 kg/m  Physical Exam Vitals and nursing note reviewed.  Constitutional:      General: She is not in  acute distress.    Appearance: Normal appearance. She is normal weight. She is not ill-appearing, toxic-appearing or diaphoretic.  HENT:     Head: Normocephalic and atraumatic.  Cardiovascular:     Rate and Rhythm: Normal rate and regular rhythm.     Heart sounds: Normal heart sounds.  Pulmonary:     Effort: Tachypnea present. No respiratory distress.  Abdominal:     Palpations: Abdomen is soft.     Tenderness: There is no abdominal tenderness.  Musculoskeletal:        General: Normal range of motion.     Cervical back: Normal range of motion.     Right lower leg: No tenderness. No edema.     Left lower leg: No tenderness. No edema.  Skin:    General: Skin is warm and dry.  Neurological:     General: No focal deficit present.     Mental Status: She is alert.  Psychiatric:        Mood and Affect: Mood normal.        Behavior: Behavior normal.     ED Results / Procedures / Treatments   Labs (all labs ordered are listed, but only abnormal results are displayed) Labs Reviewed  RESP PANEL BY RT-PCR (RSV, FLU A&B, COVID)  RVPGX2 - Abnormal; Notable for the following components:      Result Value   Influenza A by PCR POSITIVE (*)    All other components within normal limits  CBC - Abnormal; Notable for the following components:   RBC 5.54 (*)    HCT 46.1 (*)    All other components within normal limits  COMPREHENSIVE METABOLIC PANEL - Abnormal; Notable for the following components:   Sodium 133 (*)    Glucose, Bld 160 (*)    All other components within normal limits  CULTURE, BLOOD (ROUTINE X 2)  CULTURE, BLOOD (ROUTINE X 2)  LACTIC ACID, PLASMA  PROTIME-INR  APTT  URINALYSIS, W/ REFLEX TO CULTURE (INFECTION SUSPECTED)  TROPONIN I (HIGH SENSITIVITY)  TROPONIN I (HIGH SENSITIVITY)    EKG None  Radiology DG Chest 2 View Result Date: 06/17/2023 CLINICAL DATA:  Chest pain and shortness of breath EXAM: CHEST - 2 VIEW COMPARISON:  08/04/2021 FINDINGS: Cardiac shadow is  within normal limits. Lungs are well aerated bilaterally. Overlying artifact is seen. Mild stable interstitial changes are noted chronic in nature. No focal infiltrate or effusion is seen. No bony abnormality is noted. IMPRESSION: Stable interstitial changes of a chronic nature. Electronically Signed   By: Oneil Devonshire M.D.   On: 06/17/2023 19:57    Procedures .Critical Care  Performed by: Nora Lauraine LABOR, PA-C Authorized by: Lakoda Raske A, PA-C   Critical care provider statement:    Critical care time (minutes):  35   Critical care was necessary to treat or prevent imminent or life-threatening deterioration of the  following conditions:  Respiratory failure and sepsis   Critical care was time spent personally by me on the following activities:  Development of treatment plan with patient or surrogate, discussions with primary provider, evaluation of patient's response to treatment, obtaining history from patient or surrogate, ordering and review of laboratory studies, ordering and review of radiographic studies and pulse oximetry   Care discussed with: admitting provider       Medications Ordered in ED Medications  ipratropium-albuterol  (DUONEB) 0.5-2.5 (3) MG/3ML nebulizer solution 3 mL (3 mLs Nebulization Given 06/17/23 2259)  guaiFENesin -dextromethorphan  (ROBITUSSIN DM) 100-10 MG/5ML syrup 5 mL (has no administration in time range)  acetaminophen  (TYLENOL ) tablet 650 mg (650 mg Oral Given 06/17/23 1825)  oseltamivir  (TAMIFLU ) capsule 75 mg (75 mg Oral Given 06/17/23 2258)    ED Course/ Medical Decision Making/ A&P                                 Medical Decision Making Amount and/or Complexity of Data Reviewed Labs: ordered. Radiology: ordered.  Risk OTC drugs. Prescription drug management. Decision regarding hospitalization.   This patient is a 63 y.o. female who presents to the ED for concern of cough, congestion, fever, chest pain, shortness of breath, this involves an extensive  number of treatment options, and is a complaint that carries with it a high risk of complications and morbidity. The emergent differential diagnosis prior to evaluation includes, but is not limited to,  sepsis, URI, pneumonia, ACS/PE, COPD . This is not an exhaustive differential.   Past Medical History / Co-morbidities / Social History:  has a past medical history of Allergy, Asthma, COPD (chronic obstructive pulmonary disease) (HCC), Diabetes mellitus without complication (HCC), Hypertension, Morbid obesity due to excess calories (HCC), and Umbilical hernia.  Additional history: Chart reviewed.  Physical Exam: Physical exam performed. The pertinent findings include: Tachypnea, tachycardia, febrile, on 2 L O2 via [place]  Lab Tests: I ordered, and personally interpreted labs.  The pertinent results include: Flu positive, no leukocytosis, NA 133, troponin 17 --> 17.  Lactic WNL.   Imaging Studies: I ordered imaging studies including CXR. I independently visualized and interpreted imaging which showed   Stable interstitial changes of a chronic nature.   I agree with the radiologist interpretation.   Cardiac Monitoring:  The patient was maintained on a cardiac monitor.  My attending physician Dr. Mannie viewed and interpreted the cardiac monitored which showed an underlying rhythm of: sinus tachycardia. I agree with this interpretation.   Medications: I ordered medication including fluids, tylenol , duoneb, tamiflu   for tachycardia, COPD, fever, flu. Reevaluation of the patient after these medicines showed that the patient improved. I have reviewed the patients home medicines and have made adjustments as needed.   Disposition: After consideration of the diagnostic results and the patients response to treatment, I feel that patient will require admission for hypoxic respiratory failure due to influenza.  Patient remains on 2 L of oxygen via nasal cannula.  Was satting around 87% prior to being  placed on oxygen.  She has increased work of breathing as well.  Attempted neb treatments given her history of COPD, however no improvement.  She is requesting admission which is reasonable.   Discussed patient with hospitalist Dr. Adefeso who accepts patient for admission  I discussed this case with my attending physician Dr. Mannie who cosigned this note including patient's presenting symptoms, physical exam, and planned diagnostics and interventions.  Attending physician stated agreement with plan or made changes to plan which were implemented.    Final Clinical Impression(s) / ED Diagnoses Final diagnoses:  Influenza A  Acute respiratory failure with hypoxia [facility])    Rx / DC Orders ED Discharge Orders     None         Dionis Autry, Lauraine LABOR, PA-C 06/18/23 0000    Mannie Pac T, DO 06/18/23 2251

## 2023-06-17 NOTE — ED Triage Notes (Addendum)
 Pt arrives ambulatory to ED with c/o CP starting yesterday also reports shortness of breath. Some radiation to jaw and left arm. Pt reports also taking Theraflu at noon today for fever and cough. Denies any sick contacts. Reports temp at home of 107.7 temp in triage is 103.2. Pt also 89% RA, placed on 2L [place].

## 2023-06-17 NOTE — ED Notes (Signed)
 Pt oxygen turned off to assess pt O2 sat on room air.

## 2023-06-17 NOTE — H&P (Addendum)
 History and Physical    Patient: [name] FMW:[id number] DOB: 1961/03/20 DOA: 06/17/2023 DOS: the patient was seen and examined on 06/18/2023 PCP: Carlette Benita Area, MD  Patient coming from: Home  Chief Complaint:  Chief Complaint  Patient presents with   Chest Pain   HPI: [name] is a 63 y.o. female with medical history significant of COPD/asthma, T2DM, idiopathic intracranial hypertension and CSF leak (follows with neurosurgery at [facility]) who presents to the emergency department due to 2-day onset of cough, chest congestion, shortness of breath, chest pain and fever.  Patient states that she took TheraFlu today around noon without relief, so she presents to the ED for further evaluation and management.  She denies nausea, vomiting, sick contacts.  ED Course:  In the emergency department, patient was febrile with a temperature of 100 3.16F, tachypneic with respiratory rate of 24/min, pulse 110 bpm, BP 138/67, O2 sats 95% on supplemental oxygen at 2 LPM.  Workup in the ED showed normocytic anemia.  BMP was normal except for sodium of 133 and blood glucose of 160, troponin x 2 was flat at 17, lactic acid 1.2.  Influenza A was positive.  Influenza B, SARS coronavirus 2, RSV was negative. Chest x-ray showed stable interstitial changes of a chronic nature She was treated with Tylenol , Tamiflu , DuoNeb treatment x 1 was given and patient was provided with Robitussin.  Hospitalist was asked to admit patient for further evaluation and management.  Review of Systems: Review of systems as noted in the HPI. All other systems reviewed and are negative.   Past Medical History:  Diagnosis Date   Allergy    Asthma    COPD (chronic obstructive pulmonary disease) (HCC)    Diabetes mellitus without complication (HCC)    Hypertension    Morbid obesity due to excess calories (HCC)    Umbilical hernia    Past Surgical History:  Procedure Laterality Date   CARPAL TUNNEL  RELEASE Right    CHOLECYSTECTOMY     COLONOSCOPY N/A 01/12/2015   Procedure: COLONOSCOPY;  Surgeon: Margo CROME Haddock, MD;  Location: [facility] ENDO SUITE;  Service: Endoscopy;  Laterality: N/A;  0830   HYSTEROSCOPY WITH D & C N/A 04/01/2013   Procedure: DILATATION AND CURETTAGE /HYSTEROSCOPY;  Surgeon: Vonn VEAR Inch, MD;  Location: [facility] ORS;  Service: Gynecology;  Laterality: N/A;   NASAL POLYP SURGERY      Social History:  reports that she quit smoking about 26 years ago. Her smoking use included cigarettes. She started smoking about 46 years ago. She has a 10 pack-year smoking history. She has never used smokeless tobacco. She reports current alcohol use. She reports that she does not use drugs.   No Known Allergies  Family History  Problem Relation Age of Onset   Diabetes Mother    Kidney disease Mother    Other Mother        renal failure   Hypertension Mother    Cancer Mother        ?uterine cancer    Diabetes Father    Cancer Father        pancreatic   Diabetes Sister    Kidney disease Sister    Diabetes Maternal Grandmother    Asthma Maternal Grandmother    COPD Maternal Grandmother        bronchitis   Hypertension Maternal Grandmother    Thyroid  disease Maternal Grandmother    Hypertension Daughter    Colon cancer Neg Hx  Prior to Admission medications   Medication Sig Start Date End Date Taking? Authorizing Provider  acetaZOLAMIDE (DIAMOX) 250 MG tablet Take 250 mg by mouth 3 (three) times daily. 06/06/21  Yes [provider]  aspirin  81 MG EC tablet Take 1 tablet by mouth daily.   Yes [provider]  atenolol  (TENORMIN ) 25 MG tablet Take 25 mg by mouth daily. 04/11/23  Yes [provider]  diclofenac sodium (VOLTAREN) 1 % GEL Apply 2 g topically 4 (four) times daily as needed (pain). 04/03/19  Yes [provider]  empagliflozin  (JARDIANCE ) 10 MG TABS tablet Take 1 tablet (10 mg total) by mouth daily before breakfast. 06/17/23  Yes Nida,  Gebreselassie W, MD  fluticasone  (FLONASE ) 50 MCG/ACT nasal spray Place 2 sprays into both nostrils daily. 05/21/21  Yes [provider]  Fluticasone -Salmeterol (ADVAIR) 250-50 MCG/DOSE AEPB Inhale 1 puff into the lungs 2 (two) times daily.    Yes [provider]  insulin  aspart protamine - aspart (NOVOLOG  MIX 70/30 FLEXPEN) (70-30) 100 UNIT/ML FlexPen INJECT 70 UNITS AT BREAKFAST AND 70 UNITS AT SUPPER DAILY AS DIRECTED. Patient taking differently: Inject 60 Units into the skin 2 (two) times daily with a meal. INJECT 70 UNITS AT BREAKFAST AND 70 UNITS AT SUPPER DAILY AS DIRECTED. 03/02/23  Yes Nida, Gebreselassie W, MD  ipratropium-albuterol  (DUONEB) 0.5-2.5 (3) MG/3ML SOLN Take 3 mLs by nebulization every 4 (four) hours as needed (Shortness of Breath).    Yes [provider]  losartan -hydrochlorothiazide  (HYZAAR) 100-25 MG tablet Take 1 tablet by mouth daily. 10/10/20  Yes [provider]  metFORMIN  (GLUCOPHAGE ) 500 MG tablet TAKE 1 TABLET BY MOUTH TWICE A DAY WITH FOOD 05/25/23  Yes Nida, Gebreselassie W, MD  Multiple Vitamin (MULTIVITAMIN WITH MINERALS) TABS tablet Take 1 tablet by mouth daily.   Yes [provider]  rosuvastatin  (CRESTOR ) 20 MG tablet Take 0.5 tablets (10 mg total) by mouth daily. 06/03/21  Yes Johnson, Clanford L, MD  B-D UF III MINI PEN NEEDLES 31G X 5 MM MISC USE ONCE DAILY AS DIRECTED 03/24/23   Nida, Gebreselassie W, MD  Continuous Blood Gluc Sensor (DEXCOM G7 SENSOR) MISC Change sensor every 10 days. Use to check glucose as directed. 07/10/22   Lenis Ethelle ORN, MD  Continuous Glucose Sensor (DEXCOM G7 SENSOR) MISC CHANGE THE SENSOR EVERY 10 DAYS 06/09/23   Lenis Ethelle ORN, MD    Physical Exam: BP (!) 118/58 (BP Location: Left Arm)   Pulse (!) 115   Temp (!) 100.7 F (38.2 C) (Oral)   Resp 20   Ht 5' 1 (1.549 m)   Wt 101.4 kg   LMP 02/25/2019   SpO2 96%   BMI 42.24 kg/m   General: 63 y.o. year-old female well  developed well nourished in no acute distress.  Alert and oriented x3. HEENT: NCAT, EOMI Neck: Supple, trachea medial Cardiovascular: Tachycardia.  Regular rate and rhythm with no rubs or gallops.  No thyromegaly or JVD noted.  No lower extremity edema. 2/4 pulses in all 4 extremities. Respiratory: Clear to auscultation with no wheezes or rales. Good inspiratory effort. Abdomen: Soft, nontender nondistended with normal bowel sounds x4 quadrants. Muskuloskeletal: No cyanosis, clubbing or edema noted bilaterally Neuro: CN II-XII intact, strength 5/5 x 4, sensation, reflexes intact Skin: No ulcerative lesions noted or rashes Psychiatry: Judgement and insight appear normal. Mood is appropriate for condition and setting          Labs on Admission:  Basic Metabolic Panel:  Recent Labs  Lab 06/17/23 1938  NA 133*  K 3.8  CL 101  CO2 22  GLUCOSE 160*  BUN 19  CREATININE 0.90  CALCIUM  9.7   Liver Function Tests: Recent Labs  Lab 06/17/23 1938  AST 20  ALT 23  ALKPHOS 58  BILITOT 0.6  PROT 7.4  ALBUMIN 3.9   No results for input(s): LIPASE, AMYLASE in the last 168 hours. No results for input(s): AMMONIA in the last 168 hours. CBC: Recent Labs  Lab 06/17/23 1938  WBC 8.4  HGB 14.8  HCT 46.1*  MCV 83.2  PLT 219   Cardiac Enzymes: No results for input(s): CKTOTAL, CKMB, CKMBINDEX, TROPONINI in the last 168 hours.  BNP (last 3 results) No results for input(s): BNP in the last 8760 hours.  ProBNP (last 3 results) No results for input(s): PROBNP in the last 8760 hours.  CBG: Recent Labs  Lab 06/18/23 0046  GLUCAP 185*    Radiological Exams on Admission: DG Chest 2 View Result Date: 06/17/2023 CLINICAL DATA:  Chest pain and shortness of breath EXAM: CHEST - 2 VIEW COMPARISON:  08/04/2021 FINDINGS: Cardiac shadow is within normal limits. Lungs are well aerated bilaterally. Overlying artifact is seen. Mild stable interstitial changes are noted chronic  in nature. No focal infiltrate or effusion is seen. No bony abnormality is noted. IMPRESSION: Stable interstitial changes of a chronic nature. Electronically Signed   By: Oneil Devonshire M.D.   On: 06/17/2023 19:57    EKG: I independently viewed the EKG done and my findings are as followed: Sinus tachycardia at rate of 111 bpm  Assessment/Plan Present on Admission:  Influenza A  Acute respiratory failure with hypoxia (HCC)  Asthma  Essential hypertension, benign  Uncontrolled type 2 diabetes mellitus with hyperglycemia (HCC)  Mixed hyperlipidemia  Idiopathic intracranial hypertension  COPD (chronic obstructive pulmonary disease) (HCC)  Principal Problem:   Influenza A Active Problems:   Asthma   Essential hypertension, benign   Uncontrolled type 2 diabetes mellitus with hyperglycemia (HCC)   Mixed hyperlipidemia   Acute respiratory failure with hypoxia (HCC)   COPD (chronic obstructive pulmonary disease) (HCC)   Idiopathic intracranial hypertension   SIRS (systemic inflammatory response syndrome) (HCC)   Influenza A Patient was started on Tamiflu , we shall continue with same at this time Tylenol  as needed for fever  SIRS in the setting of above Patient was febrile and tachycardic.  WBC was normal Patient symptoms appears to be due to influenza Continue management as described above  Acute respiratory failure with hypoxia Continue supplemental oxygen to maintain O2 sat > 92% with plan to wean patient off this as tolerated  Morbid obesity (BMI 41.57) Patient was counseled about the cardiovascular and metabolic risk of morbid obesity. Patient was counseled for diet control, exercise regimen and weight loss.   Idiopathic intracranial hypertension Stable.  Patient follows with [facility]surgery)   Mixed hyperlipidemia Continue Crestor    Uncontrolled type 2 diabetes mellitus with hyperglycemia  Hemoglobin A1c on 03/02/2023 was 8.3 Continue ISS and  hypoglycemic protocol Continue Semglee  10 units nightly and adjust dose accordingly Continue Jardiance   Essential hypertension, benign Continue atenolol , Hyzaar  COPD/asthma Continue Dulera , DuoNeb   DVT prophylaxis: Lovenox   Code Status: Full code  Family Communication: None at bedside  Consults: None  Severity of Illness: The appropriate patient status for this patient is INPATIENT. Inpatient status is judged to be reasonable and necessary in order to provide the required intensity of service to ensure  the patient's safety. The patient's presenting symptoms, physical exam findings, and initial radiographic and laboratory data in the context of their chronic comorbidities is felt to place them at high risk for further clinical deterioration. Furthermore, it is not anticipated that the patient will be medically stable for discharge from the hospital within 2 midnights of admission.   * I certify that at the point of admission it is my clinical judgment that the patient will require inpatient hospital care spanning beyond 2 midnights from the point of admission due to high intensity of service, high risk for further deterioration and high frequency of surveillance required.*  Author: Graciana Sessa, DO 06/18/2023 3:50 AM  For on call review [email or web address].

## 2023-06-18 ENCOUNTER — Other Ambulatory Visit: Payer: Self-pay

## 2023-06-18 DIAGNOSIS — R6510 Systemic inflammatory response syndrome (SIRS) of non-infectious origin without acute organ dysfunction: Secondary | ICD-10-CM | POA: Insufficient documentation

## 2023-06-18 DIAGNOSIS — E1165 Type 2 diabetes mellitus with hyperglycemia: Secondary | ICD-10-CM

## 2023-06-18 DIAGNOSIS — J101 Influenza due to other identified influenza virus with other respiratory manifestations: Secondary | ICD-10-CM | POA: Diagnosis not present

## 2023-06-18 LAB — GLUCOSE, CAPILLARY
Glucose-Capillary: 185 mg/dL — ABNORMAL HIGH (ref 70–99)
Glucose-Capillary: 186 mg/dL — ABNORMAL HIGH (ref 70–99)
Glucose-Capillary: 188 mg/dL — ABNORMAL HIGH (ref 70–99)
Glucose-Capillary: 199 mg/dL — ABNORMAL HIGH (ref 70–99)
Glucose-Capillary: 258 mg/dL — ABNORMAL HIGH (ref 70–99)

## 2023-06-18 LAB — CBC
HCT: 43.9 % (ref 36.0–46.0)
Hemoglobin: 13.9 g/dL (ref 12.0–15.0)
MCH: 26.6 pg (ref 26.0–34.0)
MCHC: 31.7 g/dL (ref 30.0–36.0)
MCV: 83.9 fL (ref 80.0–100.0)
Platelets: 194 10*3/uL (ref 150–400)
RBC: 5.23 MIL/uL — ABNORMAL HIGH (ref 3.87–5.11)
RDW: 14.6 % (ref 11.5–15.5)
WBC: 6.2 10*3/uL (ref 4.0–10.5)
nRBC: 0 % (ref 0.0–0.2)

## 2023-06-18 LAB — COMPREHENSIVE METABOLIC PANEL
ALT: 25 U/L (ref 0–44)
AST: 23 U/L (ref 15–41)
Albumin: 3.4 g/dL — ABNORMAL LOW (ref 3.5–5.0)
Alkaline Phosphatase: 53 U/L (ref 38–126)
Anion gap: 10 (ref 5–15)
BUN: 17 mg/dL (ref 8–23)
CO2: 22 mmol/L (ref 22–32)
Calcium: 9 mg/dL (ref 8.9–10.3)
Chloride: 101 mmol/L (ref 98–111)
Creatinine, Ser: 0.88 mg/dL (ref 0.44–1.00)
GFR, Estimated: >60 mL/min (ref >60)
Glucose, Bld: 191 mg/dL — ABNORMAL HIGH (ref 70–99)
Potassium: 4.1 mmol/L (ref 3.5–5.1)
Sodium: 133 mmol/L — ABNORMAL LOW (ref 135–145)
Total Bilirubin: 0.6 mg/dL (ref 0.0–1.2)
Total Protein: 6.6 g/dL (ref 6.5–8.1)

## 2023-06-18 LAB — HIV ANTIBODY (ROUTINE TESTING W REFLEX): HIV Screen 4th Generation wRfx: NONREACTIVE

## 2023-06-18 MED ORDER — LOSARTAN POTASSIUM 50 MG PO TABS
100.0000 mg | ORAL_TABLET | Freq: Every day | ORAL | Status: DC
  Administered 2023-06-18 – 2023-06-19 (×2): 100 mg via ORAL
  Filled 2023-06-18 (×2): qty 2

## 2023-06-18 MED ORDER — ENOXAPARIN SODIUM 40 MG/0.4ML IJ SOSY
40.0000 mg | PREFILLED_SYRINGE | INTRAMUSCULAR | Status: DC
Start: 2023-06-18 — End: 2023-06-19
  Administered 2023-06-18 – 2023-06-19 (×2): 40 mg via SUBCUTANEOUS
  Filled 2023-06-18 (×2): qty 0.4

## 2023-06-18 MED ORDER — ATENOLOL 25 MG PO TABS
25.0000 mg | ORAL_TABLET | Freq: Every day | ORAL | Status: DC
  Administered 2023-06-18 – 2023-06-19 (×2): 25 mg via ORAL
  Filled 2023-06-18 (×2): qty 1

## 2023-06-18 MED ORDER — GUAIFENESIN-DM 100-10 MG/5ML PO SYRP
5.0000 mL | ORAL_SOLUTION | ORAL | Status: DC | PRN
  Administered 2023-06-18 – 2023-06-19 (×4): 5 mL via ORAL
  Filled 2023-06-18 (×4): qty 5

## 2023-06-18 MED ORDER — ONDANSETRON HCL 4 MG PO TABS: 4.0000 mg | ORAL_TABLET | Freq: Four times a day (QID) | ORAL | Status: DC | PRN

## 2023-06-18 MED ORDER — PREDNISONE 20 MG PO TABS
40.0000 mg | ORAL_TABLET | Freq: Every day | ORAL | Status: DC
  Administered 2023-06-18 – 2023-06-19 (×2): 40 mg via ORAL
  Filled 2023-06-18 (×2): qty 2

## 2023-06-18 MED ORDER — ONDANSETRON HCL 4 MG/2ML IJ SOLN
4.0000 mg | Freq: Four times a day (QID) | INTRAMUSCULAR | Status: DC | PRN
  Administered 2023-06-18: 4 mg via INTRAVENOUS
  Filled 2023-06-18: qty 2

## 2023-06-18 MED ORDER — IPRATROPIUM-ALBUTEROL 0.5-2.5 (3) MG/3ML IN SOLN
3.0000 mL | Freq: Four times a day (QID) | RESPIRATORY_TRACT | Status: DC
  Administered 2023-06-18 – 2023-06-19 (×4): 3 mL via RESPIRATORY_TRACT
  Filled 2023-06-18 (×4): qty 3

## 2023-06-18 MED ORDER — OSELTAMIVIR PHOSPHATE 75 MG PO CAPS
75.0000 mg | ORAL_CAPSULE | Freq: Two times a day (BID) | ORAL | Status: DC
  Administered 2023-06-18 – 2023-06-19 (×3): 75 mg via ORAL
  Filled 2023-06-18 (×3): qty 1

## 2023-06-18 MED ORDER — HYDROCHLOROTHIAZIDE 25 MG PO TABS
25.0000 mg | ORAL_TABLET | Freq: Every day | ORAL | Status: DC
  Administered 2023-06-18 – 2023-06-19 (×2): 25 mg via ORAL
  Filled 2023-06-18 (×2): qty 1

## 2023-06-18 MED ORDER — EMPAGLIFLOZIN 10 MG PO TABS
10.0000 mg | ORAL_TABLET | Freq: Every day | ORAL | Status: DC
  Administered 2023-06-18 – 2023-06-19 (×2): 10 mg via ORAL
  Filled 2023-06-18 (×2): qty 1

## 2023-06-18 MED ORDER — ACETAMINOPHEN 325 MG PO TABS
650.0000 mg | ORAL_TABLET | Freq: Four times a day (QID) | ORAL | Status: DC | PRN
  Administered 2023-06-18 – 2023-06-19 (×4): 650 mg via ORAL
  Filled 2023-06-18 (×4): qty 2

## 2023-06-18 MED ORDER — POLYETHYLENE GLYCOL 3350 17 G PO PACK
17.0000 g | PACK | Freq: Every day | ORAL | Status: DC
Start: 2023-06-18 — End: 2023-06-19
  Administered 2023-06-18 – 2023-06-19 (×2): 17 g via ORAL
  Filled 2023-06-18 (×2): qty 1

## 2023-06-18 MED ORDER — ROSUVASTATIN CALCIUM 10 MG PO TABS
10.0000 mg | ORAL_TABLET | Freq: Every day | ORAL | Status: DC
  Administered 2023-06-18 – 2023-06-19 (×2): 10 mg via ORAL
  Filled 2023-06-18 (×2): qty 1

## 2023-06-18 MED ORDER — ASPIRIN 81 MG PO TBEC
81.0000 mg | DELAYED_RELEASE_TABLET | Freq: Every day | ORAL | Status: DC
  Administered 2023-06-18 – 2023-06-19 (×2): 81 mg via ORAL
  Filled 2023-06-18 (×2): qty 1

## 2023-06-18 MED ORDER — IPRATROPIUM-ALBUTEROL 0.5-2.5 (3) MG/3ML IN SOLN
3.0000 mL | RESPIRATORY_TRACT | Status: DC | PRN
  Administered 2023-06-18: 3 mL via RESPIRATORY_TRACT
  Filled 2023-06-18: qty 3

## 2023-06-18 MED ORDER — ALBUTEROL SULFATE (2.5 MG/3ML) 0.083% IN NEBU
2.5000 mg | INHALATION_SOLUTION | RESPIRATORY_TRACT | Status: DC | PRN
  Administered 2023-06-18: 2.5 mg via RESPIRATORY_TRACT
  Filled 2023-06-18: qty 3

## 2023-06-18 MED ORDER — INSULIN ASPART 100 UNIT/ML IJ SOLN
0.0000 [IU] | Freq: Every day | INTRAMUSCULAR | Status: DC
Start: 2023-06-18 — End: 2023-06-19
  Administered 2023-06-18: 3 [IU] via SUBCUTANEOUS

## 2023-06-18 MED ORDER — ACETAMINOPHEN 650 MG RE SUPP: 650.0000 mg | Freq: Four times a day (QID) | RECTAL | Status: DC | PRN

## 2023-06-18 MED ORDER — LOSARTAN POTASSIUM-HCTZ 100-25 MG PO TABS: 1.0000 | ORAL_TABLET | Freq: Every day | ORAL | Status: DC

## 2023-06-18 MED ORDER — INSULIN ASPART 100 UNIT/ML IJ SOLN
0.0000 [IU] | Freq: Three times a day (TID) | INTRAMUSCULAR | Status: DC
  Administered 2023-06-18 (×3): 3 [IU] via SUBCUTANEOUS
  Administered 2023-06-19: 8 [IU] via SUBCUTANEOUS

## 2023-06-18 MED ORDER — MOMETASONE FURO-FORMOTEROL FUM 200-5 MCG/ACT IN AERO
2.0000 | INHALATION_SPRAY | Freq: Two times a day (BID) | RESPIRATORY_TRACT | Status: DC
  Administered 2023-06-18 – 2023-06-19 (×3): 2 via RESPIRATORY_TRACT
  Filled 2023-06-18: qty 8.8

## 2023-06-18 MED ORDER — INSULIN GLARGINE-YFGN 100 UNIT/ML ~~LOC~~ SOLN
10.0000 [IU] | Freq: Every day | SUBCUTANEOUS | Status: DC
  Administered 2023-06-18: 10 [IU] via SUBCUTANEOUS
  Filled 2023-06-18 (×2): qty 0.1

## 2023-06-18 MED ORDER — EMPAGLIFLOZIN 10 MG PO TABS: 10.0000 mg | ORAL_TABLET | Freq: Every day | ORAL | 0 refills | Status: DC

## 2023-06-18 NOTE — Plan of Care (Signed)

## 2023-06-18 NOTE — ED Notes (Signed)
 IV access attempted x 2 with no success, Kayla RN attempting IV at this time.

## 2023-06-18 NOTE — Progress Notes (Signed)
 Mobility Specialist Progress Note:    06/18/23 1429  Mobility  Activity Ambulated with assistance in room;Stood at bedside;Dangled on edge of bed  Level of Assistance Independent  Assistive Device None  Distance Ambulated (ft) 25 ft  Range of Motion/Exercises Active;All extremities  Activity Response Tolerated well  Mobility Referral Yes  Mobility visit 1 Mobility  Mobility Specialist Start Time (ACUTE ONLY) 1405  Mobility Specialist Stop Time (ACUTE ONLY) 1425  Mobility Specialist Time Calculation (min) (ACUTE ONLY) 20 min   Pt received in bed, agreeable to mobility. Independently able to stand and ambulate with no AD. Tolerated well, see O2 sats below. Returned pt sitting EOB, all needs met.   SpO2 90% on RA at rest SpO2 88% on RA during ambulation SpO2 92% on 2L after session   Sherrilee Ditty Mobility Specialist Please contact via SecureChat or  Rehab office at [phone]

## 2023-06-18 NOTE — Progress Notes (Signed)
 PROGRESS NOTE    [name]  FMW:[id number] DOB: Jul 30, 1960 DOA: 06/17/2023 PCP: Brittney Benita Area, MD  Chief Complaint  Patient presents with   Chest Pain    Brief Narrative:   [name] is [name] 63 y.o. female with medical history significant of COPD/asthma, T2DM, idiopathic intracranial hypertension and CSF leak (follows with neurosurgery at [name]) who presents to the emergency department due to 2-day onset of cough, chest congestion, shortness of breath, chest pain and fever.   She's being treated for an influenza [name] infection.   Assessment & Plan:   Principal Problem:   Influenza [name] Active Problems:   Asthma   Essential hypertension, benign   Uncontrolled type 2 diabetes mellitus with hyperglycemia (HCC)   Mixed hyperlipidemia   Acute respiratory failure with hypoxia (HCC)   COPD (chronic obstructive pulmonary disease) (HCC)   Idiopathic intracranial hypertension   SIRS (systemic inflammatory response syndrome) (HCC)  Sepsis due to Influenza [name] infection  Continue tamiflu  Low threshold to add abx to cover for bacterial superinfection   COPD  Asthma Reported wheezing when discussing with RT Short course of steroids ordered Continue scheduled and prn nebs   Acute respiratory failure with hypoxia Continue supplemental oxygen to maintain O2 sat > 92% with plan to wean patient off this as tolerated   Morbid obesity (BMI 41.57) noted   Idiopathic intracranial hypertension Stable.  Patient follows with [name] neuro[facility])   Mixed hyperlipidemia Continue Crestor    Uncontrolled type 2 diabetes mellitus with hyperglycemia  Hemoglobin A1c on 03/02/2023 was 8.3 Continue ISS and hypoglycemic protocol Continue Semglee  10 units nightly and adjust dose accordingly Continue Jardiance    Essential hypertension, benign Continue atenolol , Hyzaar   COPD/asthma Continue Dulera , DuoNeb    DVT prophylaxis: lovenox  Code Status:  full Family Communication: none Disposition:   Status is: Inpatient Remains inpatient appropriate because: need for continued inpatient care   Consultants:  none  Procedures:  none  Antimicrobials:  Anti-infectives (From admission, onward)    Start     Dose/Rate Route Frequency Ordered Stop   06/18/23 1000  oseltamivir  (TAMIFLU ) capsule 75 mg        75 mg Oral 2 times daily 06/18/23 0020 06/22/23 2159   06/17/23 2300  oseltamivir  (TAMIFLU ) capsule 75 mg        75 mg Oral  Once 06/17/23 2247 06/17/23 2258       Subjective: No complaints  Objective: Vitals:   06/18/23 0900 06/18/23 1124 06/18/23 1429 06/18/23 1451  BP: (!) 124/55   117/63  Pulse:    (!) 102  Resp: 20     Temp: 100.1 F (37.8 C)     TempSrc:      SpO2: 94% (!) 89% 92% 93%  Weight:      Height:        Intake/Output Summary (Last 24 hours) at 06/18/2023 1626 Last data filed at 06/18/2023 1300 Gross per 24 hour  Intake 720 ml  Output --  Net 720 ml   Filed Weights   06/17/23 1818 06/18/23 0030  Weight: 99.8 kg 101.4 kg    Examination:  General exam: Appears calm and comfortable  Respiratory system: CTAB, no appreciated wheezing Cardiovascular system: RRR Gastrointestinal system: Abdomen is nondistended, soft and nontender. No organomegaly or masses felt. Normal bowel sounds heard. Central nervous system: Alert and oriented. No focal neurological deficits. Extremities: no LEE   Data Reviewed: I have personally reviewed following labs and imaging studies  CBC: Recent Labs  Lab 06/17/23 1938 06/18/23 0439  WBC 8.4 6.2  HGB 14.8 13.9  HCT 46.1* 43.9  MCV 83.2 83.9  PLT 219 194    Basic Metabolic Panel: Recent Labs  Lab 06/17/23 1938 06/18/23 0439  NA 133* 133*  K 3.8 4.1  CL 101 101  CO2 22 22  GLUCOSE 160* 191*  BUN 19 17  CREATININE 0.90 0.88  CALCIUM  9.7 9.0    GFR: Estimated Creatinine Clearance: 72.4 mL/min (by C-G formula based on SCr of 0.88 mg/dL).  Liver  Function Tests: Recent Labs  Lab 06/17/23 1938 06/18/23 0439  AST 20 23  ALT 23 25  ALKPHOS 58 53  BILITOT 0.6 0.6  PROT 7.4 6.6  ALBUMIN 3.9 3.4*    CBG: Recent Labs  Lab 06/18/23 0046 06/18/23 0735 06/18/23 1124  GLUCAP 185* 188* 186*     Recent Results (from the past 240 hours)  Resp panel by RT-PCR (RSV, Flu [name]&B, Covid) Anterior Nasal Swab     Status: Abnormal   Collection Time: 06/17/23  6:30 PM   Specimen: Anterior Nasal Swab  Result Value Ref Range Status   SARS Coronavirus 2 by RT PCR NEGATIVE NEGATIVE Final    Comment: (NOTE) SARS-CoV-2 target nucleic acids are NOT DETECTED.  The SARS-CoV-2 RNA is generally detectable in upper respiratory specimens during the acute phase of infection. The lowest concentration of SARS-CoV-2 viral copies this assay can detect is 138 copies/mL. [name] negative result does not preclude SARS-Cov-2 infection and should not be used as the sole basis for treatment or other patient management decisions. [name] negative result may occur with  improper specimen collection/handling, submission of specimen other than nasopharyngeal swab, presence of viral mutation(s) within the areas targeted by this assay, and inadequate number of viral copies(<138 copies/mL). [name] negative result must be combined with clinical observations, patient history, and epidemiological information. The expected result is Negative.  Fact Sheet for Patients:  bloggercourse.com  Fact Sheet for Healthcare Providers:  seriousbroker.it  This test is no t yet approved or cleared by the [place]  FDA and  has been authorized for detection and/or diagnosis of SARS-CoV-2 by FDA under an Emergency Use Authorization (EUA). This EUA will remain  in effect (meaning this test can be used) for the duration of the COVID-19 declaration under Section 564(b)(1) of the Act, 21 U.S.C.section 360bbb-3(b)(1), unless the authorization is  terminated  or revoked sooner.       Influenza [name] by PCR POSITIVE ([name]) NEGATIVE Final   Influenza B by PCR NEGATIVE NEGATIVE Final    Comment: (NOTE) The Xpert Xpress SARS-CoV-2/FLU/RSV plus assay is intended as an aid in the diagnosis of influenza from Nasopharyngeal swab specimens and should not be used as Clemencia Helzer sole basis for treatment. Nasal washings and aspirates are unacceptable for Xpert Xpress SARS-CoV-2/FLU/RSV testing.  Fact Sheet for Patients: bloggercourse.com  Fact Sheet for Healthcare Providers: seriousbroker.it  This test is not yet approved or cleared by the [place]  FDA and has been authorized for detection and/or diagnosis of SARS-CoV-2 by FDA under an Emergency Use Authorization (EUA). This EUA will remain in effect (meaning this test can be used) for the duration of the COVID-19 declaration under Section 564(b)(1) of the Act, 21 U.S.C. section 360bbb-3(b)(1), unless the authorization is terminated or revoked.     Resp Syncytial Virus by PCR NEGATIVE NEGATIVE Final    Comment: (NOTE) Fact Sheet for Patients: bloggercourse.com  Fact Sheet for Healthcare Providers: seriousbroker.it  This  test is not yet approved or cleared by the [place]  FDA and has been authorized for detection and/or diagnosis of SARS-CoV-2 by FDA under an Emergency Use Authorization (EUA). This EUA will remain in effect (meaning this test can be used) for the duration of the COVID-19 declaration under Section 564(b)(1) of the Act, 21 U.S.C. section 360bbb-3(b)(1), unless the authorization is terminated or revoked.  Performed at [facility], [address] [place], [place] [address]   Blood Culture (routine x 2)     Status: None (Preliminary result)   Collection Time: 06/17/23  7:38 PM   Specimen: BLOOD  Result Value Ref Range Status   Specimen Description BLOOD RIGHT  ANTECUBITAL  Final   Special Requests   Final    BOTTLES DRAWN AEROBIC ONLY Blood Culture adequate volume   Culture   Final    NO GROWTH < 12 HOURS Performed at [facility], [address], [place], [place] [address]    Report Status PENDING  Incomplete  Blood Culture (routine x 2)     Status: None (Preliminary result)   Collection Time: 06/17/23  7:56 PM   Specimen: BLOOD  Result Value Ref Range Status   Specimen Description BLOOD LEFT ANTECUBITAL  Final   Special Requests   Final    BOTTLES DRAWN AEROBIC AND ANAEROBIC Blood Culture adequate volume   Culture   Final    NO GROWTH < 12 HOURS Performed at [facility], [address] [place], [place] [address]    Report Status PENDING  Incomplete         Radiology Studies: DG Chest 2 View Result Date: 06/17/2023 CLINICAL DATA:  Chest pain and shortness of breath EXAM: CHEST - 2 VIEW COMPARISON:  08/04/2021 FINDINGS: Cardiac shadow is within normal limits. Lungs are well aerated bilaterally. Overlying artifact is seen. Mild stable interstitial changes are noted chronic in nature. No focal infiltrate or effusion is seen. No bony abnormality is noted. IMPRESSION: Stable interstitial changes of Delesa Kawa chronic nature. Electronically Signed   By: Oneil Devonshire M.D.   On: 06/17/2023 19:57        Scheduled Meds:  aspirin  EC  81 mg Oral Daily   atenolol   25 mg Oral Daily   empagliflozin   10 mg Oral QAC breakfast   enoxaparin  (LOVENOX ) injection  40 mg Subcutaneous Q24H   losartan   100 mg Oral Daily   And   hydrochlorothiazide   25 mg Oral Daily   insulin  aspart  0-15 Units Subcutaneous TID WC   insulin  aspart  0-5 Units Subcutaneous QHS   insulin  glargine-yfgn  10 Units Subcutaneous QHS   ipratropium-albuterol   3 mL Nebulization Q6H   mometasone -formoterol   2 puff Inhalation BID   oseltamivir   75 mg Oral BID   predniSONE   40 mg Oral Q breakfast   rosuvastatin   10 mg Oral Daily   Continuous Infusions:   LOS: 1 day    Time spent:  over 30 min     Meliton Monte, MD [facility]   To contact the attending provider between 7A-7P or the covering provider during after hours 7P-7A, please log into the web site [email or web address] and access using universal [place] password for that web site. If you do not have the password, please call the [facility] operator.  06/18/2023, 4:26 PM

## 2023-06-18 NOTE — Progress Notes (Signed)
   06/18/23 1007  TOC Brief Assessment  Insurance and Status Reviewed  Patient has primary care physician Yes  Home environment has been reviewed from home  Prior level of function: independent  Prior/Current Home Services No current home services  Social Drivers of Health Review SDOH reviewed no interventions necessary  Readmission risk has been reviewed Yes  Transition of care needs no transition of care needs at this time    Transition of Care Department [facility]) has reviewed patient and no TOC needs have been identified at this time. We will continue to monitor patient advancement through interdisciplinary progression rounds. If new patient transition needs arise, please place a TOC consult.

## 2023-06-19 DIAGNOSIS — J101 Influenza due to other identified influenza virus with other respiratory manifestations: Secondary | ICD-10-CM | POA: Diagnosis not present

## 2023-06-19 LAB — CBC WITH DIFFERENTIAL/PLATELET
Abs Immature Granulocytes: 0.01 10*3/uL (ref 0.00–0.07)
Basophils Absolute: 0 10*3/uL (ref 0.0–0.1)
Basophils Relative: 0 %
Eosinophils Absolute: 0 10*3/uL (ref 0.0–0.5)
Eosinophils Relative: 0 %
HCT: 43.6 % (ref 36.0–46.0)
Hemoglobin: 14.1 g/dL (ref 12.0–15.0)
Immature Granulocytes: 0 %
Lymphocytes Relative: 20 %
Lymphs Abs: 1 10*3/uL (ref 0.7–4.0)
MCH: 27.2 pg (ref 26.0–34.0)
MCHC: 32.3 g/dL (ref 30.0–36.0)
MCV: 84 fL (ref 80.0–100.0)
Monocytes Absolute: 0.9 10*3/uL (ref 0.1–1.0)
Monocytes Relative: 18 %
Neutro Abs: 3.1 10*3/uL (ref 1.7–7.7)
Neutrophils Relative %: 62 %
Platelets: 201 10*3/uL (ref 150–400)
RBC: 5.19 MIL/uL — ABNORMAL HIGH (ref 3.87–5.11)
RDW: 14.6 % (ref 11.5–15.5)
WBC: 5.1 10*3/uL (ref 4.0–10.5)
nRBC: 0 % (ref 0.0–0.2)

## 2023-06-19 LAB — COMPREHENSIVE METABOLIC PANEL
ALT: 22 U/L (ref 0–44)
AST: 22 U/L (ref 15–41)
Albumin: 3.4 g/dL — ABNORMAL LOW (ref 3.5–5.0)
Alkaline Phosphatase: 49 U/L (ref 38–126)
Anion gap: 9 (ref 5–15)
BUN: 22 mg/dL (ref 8–23)
CO2: 27 mmol/L (ref 22–32)
Calcium: 9.1 mg/dL (ref 8.9–10.3)
Chloride: 99 mmol/L (ref 98–111)
Creatinine, Ser: 0.87 mg/dL (ref 0.44–1.00)
GFR, Estimated: >60 mL/min (ref >60)
Glucose, Bld: 128 mg/dL — ABNORMAL HIGH (ref 70–99)
Potassium: 3.5 mmol/L (ref 3.5–5.1)
Sodium: 135 mmol/L (ref 135–145)
Total Bilirubin: 0.6 mg/dL (ref 0.0–1.2)
Total Protein: 6.9 g/dL (ref 6.5–8.1)

## 2023-06-19 LAB — GLUCOSE, CAPILLARY
Glucose-Capillary: 146 mg/dL — ABNORMAL HIGH (ref 70–99)
Glucose-Capillary: 256 mg/dL — ABNORMAL HIGH (ref 70–99)

## 2023-06-19 LAB — MAGNESIUM: Magnesium: 2 mg/dL (ref 1.7–2.4)

## 2023-06-19 LAB — HEMOGLOBIN A1C
Hgb A1c MFr Bld: 8.5 % — ABNORMAL HIGH (ref 4.8–5.6)
Mean Plasma Glucose: 197 mg/dL

## 2023-06-19 LAB — PHOSPHORUS: Phosphorus: 3 mg/dL (ref 2.5–4.6)

## 2023-06-19 MED ORDER — OSELTAMIVIR PHOSPHATE 75 MG PO CAPS: 75.0000 mg | ORAL_CAPSULE | Freq: Two times a day (BID) | ORAL | 0 refills | Status: AC

## 2023-06-19 MED ORDER — PREDNISONE 20 MG PO TABS: 40.0000 mg | ORAL_TABLET | Freq: Every day | ORAL | 0 refills | Status: AC

## 2023-06-19 NOTE — Plan of Care (Signed)

## 2023-06-19 NOTE — Progress Notes (Signed)
SATURATION QUALIFICATIONS: (This note is used to comply with regulatory documentation for home oxygen)  Patient Saturations on Room Air at Rest = 96 %  Patient Saturations on Room Air while Ambulating =94%    Please briefly explain why patient needs home oxygen: 

## 2023-06-19 NOTE — Inpatient Diabetes Management (Signed)
 Inpatient Diabetes Program Recommendations  AACE/ADA: New Consensus Statement on Inpatient Glycemic Control   Target Ranges:  Prepandial:   less than 140 mg/dL      Peak postprandial:   less than 180 mg/dL (1-2 hours)      Critically ill patients:  140 - 180 mg/dL    Latest Reference Range & Units 06/18/23 07:35 06/18/23 11:24 06/18/23 16:27 06/18/23 20:53 06/19/23 08:14 06/19/23 11:53  Glucose-Capillary 70 - 99 mg/dL 811 (H) 813 (H) 800 (H) 258 (H) 146 (H) 256 (H)    Review of Glycemic Control  Diabetes history: DM2 Outpatient Diabetes medications: Jardiance  10 mg daily, Novolog  70/30 60 units BID, Metformin  500 mg BID Current orders for Inpatient glycemic control: Semglee  10 units at bedtime, Novolog  0-15 units TID with meals, Novolog  0-5 units at bedtime, Jardiance  10 mg QAM; Prednisone  40 mg QAM  Inpatient Diabetes Program Recommendations:    Insulin : If steroids are continued, please consider ordering Novolog  3 units TID with meals for meal coverage if patient eats at least 50% of meals.  Thanks, Earnie Gainer, RN, MSN, CDCES Diabetes Coordinator Inpatient Diabetes Program [phone] (Team Pager from 8am to 5pm)

## 2023-06-19 NOTE — Discharge Summary (Signed)
 Physician Discharge Summary  [name] FMW:[id number] DOB: [birth year] DOA: 06/17/2023  PCP: Carlette Benita Area, MD  Admit date: 06/17/2023 Discharge date: 06/19/2023  Time spent: 40 minutes  Recommendations for Outpatient Follow-up:  Follow outpatient CBC/CMP  Follow blood sugars outpatient   Discharge Diagnoses:  Principal Problem:   Influenza [name] Active Problems:   Asthma   Essential hypertension, benign   Uncontrolled type 2 diabetes mellitus with hyperglycemia (HCC)   Mixed hyperlipidemia   Acute respiratory failure with hypoxia (HCC)   COPD (chronic obstructive pulmonary disease) (HCC)   Idiopathic intracranial hypertension   SIRS (systemic inflammatory response syndrome) (HCC)   Discharge Condition: stable  Diet recommendation: heart healthy, diabetic  Filed Weights   06/17/23 1818 06/18/23 0030  Weight: 99.8 kg 101.4 kg    History of present illness:   [name] is [name] 63 y.o. female with medical history significant of COPD/asthma, T2DM, idiopathic intracranial hypertension and CSF leak (follows with neurosurgery at [facility]) who presents to the emergency department due to 2-day onset of cough, chest congestion, shortness of breath, chest pain and fever.    She's being treated for an influenza Alga Southall infection.    She's improved on the day of discharge, now on RA.  Discharge with plan to complete tamiflu .  1 more day steroids.  Hospital Course:  Assessment and Plan:  Sepsis due to Influenza Kaizen Ibsen infection  Continue tamiflu  Fevers resolved Cx Ngx2 Discussed return precautions   COPD  Asthma No wheezing today Short course of steroids ordered, 1 more day at discharge Continue scheduled and prn nebs   Acute respiratory failure with hypoxia Maintaining sats on RA   Morbid obesity (BMI 41.57) noted   Idiopathic intracranial hypertension Stable.  Patient follows with [facility] neurosurgery)   Mixed hyperlipidemia Continue  Crestor    Uncontrolled type 2 diabetes mellitus with hyperglycemia  Hemoglobin A1c on 03/02/2023 was 8.3 Resume home regimen    Essential hypertension, benign Continue atenolol , Hyzaar   COPD/asthma Continue Dulera , DuoNeb     Procedures: none   Consultations: none  Discharge Exam: Vitals:   06/19/23 0800 06/19/23 0828  BP: 106/64   Pulse: 87   Resp:    Temp:    SpO2: 94% 99%   No complaints Discussed d/c plan  General: No acute distress. Cardiovascular: RRR Lungs: unlabored, CTAB Neurological: Alert and oriented 3. Moves all extremities 4 with equal strength. Cranial nerves II through XII grossly intact. Extremities: No clubbing or cyanosis. No edema.   Discharge Instructions   Discharge Instructions     Call MD for:  difficulty breathing, headache or visual disturbances   Complete by: As directed    Call MD for:  extreme fatigue   Complete by: As directed    Call MD for:  hives   Complete by: As directed    Call MD for:  persistant dizziness or light-headedness   Complete by: As directed    Call MD for:  persistant nausea and vomiting   Complete by: As directed    Call MD for:  redness, tenderness, or signs of infection (pain, swelling, redness, odor or green/yellow discharge around incision site)   Complete by: As directed    Call MD for:  severe uncontrolled pain   Complete by: As directed    Call MD for:  temperature >100.4   Complete by: As directed    Diet - low sodium heart healthy   Complete by: As directed  Discharge instructions   Complete by: As directed    You were seen for influenza infection.  You've improved with tamiflu  and steroids.   I'll discharge you with prescription for tamiflu  to complete the course.  Will send you with 1 more day of prednisone .   Return for new, recurrent, or worsening symptoms.  Please ask your PCP to request records from this hospitalization so they know what was done and what the next steps will  be.   Increase activity slowly   Complete by: As directed       Allergies as of 06/19/2023   No Known Allergies      Medication List     TAKE these medications    acetaZOLAMIDE 250 MG tablet Commonly known as: DIAMOX Take 250 mg by mouth 3 (three) times daily.   aspirin  EC 81 MG tablet Take 1 tablet by mouth daily.   atenolol  25 MG tablet Commonly known as: TENORMIN  Take 25 mg by mouth daily.   B-D UF III MINI PEN NEEDLES 31G X 5 MM Misc Generic drug: Insulin  Pen Needle USE ONCE DAILY AS DIRECTED   Dexcom G7 Sensor Misc Change sensor every 10 days. Use to check glucose as directed.   Dexcom G7 Sensor Misc CHANGE THE SENSOR EVERY 10 DAYS   diclofenac sodium 1 % Gel Commonly known as: VOLTAREN Apply 2 g topically 4 (four) times daily as needed (pain).   empagliflozin  10 MG Tabs tablet Commonly known as: Jardiance  Take 1 tablet (10 mg total) by mouth daily before breakfast.   fluticasone  50 MCG/ACT nasal spray Commonly known as: FLONASE  Place 2 sprays into both nostrils daily.   Fluticasone -Salmeterol 250-50 MCG/DOSE Aepb Commonly known as: ADVAIR Inhale 1 puff into the lungs 2 (two) times daily.   ipratropium-albuterol  0.5-2.5 (3) MG/3ML Soln Commonly known as: DUONEB Take 3 mLs by nebulization every 4 (four) hours as needed (Shortness of Breath).   losartan -hydrochlorothiazide  100-25 MG tablet Commonly known as: HYZAAR Take 1 tablet by mouth daily.   metFORMIN  500 MG tablet Commonly known as: GLUCOPHAGE  TAKE 1 TABLET BY MOUTH TWICE Charidy Cappelletti DAY WITH FOOD   multivitamin with minerals Tabs tablet Take 1 tablet by mouth daily.   NovoLOG  Mix 70/30 FlexPen (70-30) 100 UNIT/ML FlexPen Generic drug: insulin  aspart protamine - aspart INJECT 70 UNITS AT BREAKFAST AND 70 UNITS AT SUPPER DAILY AS DIRECTED. What changed:  how much to take how to take this when to take this   oseltamivir  75 MG capsule Commonly known as: TAMIFLU  Take 1 capsule (75 mg total) by  mouth 2 (two) times daily for 3 days.   predniSONE  20 MG tablet Commonly known as: DELTASONE  Take 2 tablets (40 mg total) by mouth daily with breakfast for 1 day.   rosuvastatin  20 MG tablet Commonly known as: CRESTOR  Take 0.5 tablets (10 mg total) by mouth daily.       No Known Allergies    The results of significant diagnostics from this hospitalization (including imaging, microbiology, ancillary and laboratory) are listed below for reference.    Significant Diagnostic Studies: DG Chest 2 View Result Date: 06/17/2023 CLINICAL DATA:  Chest pain and shortness of breath EXAM: CHEST - 2 VIEW COMPARISON:  08/04/2021 FINDINGS: Cardiac shadow is within normal limits. Lungs are well aerated bilaterally. Overlying artifact is seen. Mild stable interstitial changes are noted chronic in nature. No focal infiltrate or effusion is seen. No bony abnormality is noted. 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 Electronically Signed  By: Oneil Devonshire M.D.   On: 06/17/2023 19:57    Microbiology: Recent Results (from the past 240 hours)  Resp panel by RT-PCR (RSV, Flu Ileta Ofarrell&B, Covid) Anterior Nasal Swab     Status: Abnormal   Collection Time: 06/17/23  6:30 PM   Specimen: Anterior Nasal Swab  Result Value Ref Range Status   SARS Coronavirus 2 by RT PCR NEGATIVE NEGATIVE Final    Comment: (NOTE) SARS-CoV-2 target nucleic acids are NOT DETECTED.  The SARS-CoV-2 RNA is generally detectable in upper respiratory specimens during the acute phase of infection. The lowest concentration of SARS-CoV-2 viral copies this assay can detect is 138 copies/mL. Natilee Gauer negative result does not preclude SARS-Cov-2 infection and should not be used as the sole basis for treatment or other patient management decisions. Dianely Krehbiel negative result may occur with  improper specimen collection/handling, submission of specimen other than nasopharyngeal swab, presence of viral mutation(s) within the areas targeted by  this assay, and inadequate number of viral copies(<138 copies/mL). Cody Oliger negative result must be combined with clinical observations, patient history, and epidemiological information. The expected result is Negative.  Fact Sheet for Patients:  bloggercourse.com  Fact Sheet for Healthcare Providers:  seriousbroker.it  This test is no t yet approved or cleared by the [place]  FDA and  has been authorized for detection and/or diagnosis of SARS-CoV-2 by FDA under an Emergency Use Authorization (EUA). This EUA will remain  in effect (meaning this test can be used) for the duration of the COVID-19 declaration under Section 564(b)(1) of the Act, 21 U.S.C.section 360bbb-3(b)(1), unless the authorization is terminated  or revoked sooner.       Influenza Jaylin Benzel by PCR POSITIVE (Tellis Spivak) NEGATIVE Final   Influenza B by PCR NEGATIVE NEGATIVE Final    Comment: (NOTE) The Xpert Xpress SARS-CoV-2/FLU/RSV plus assay is intended as an aid in the diagnosis of influenza from Nasopharyngeal swab specimens and should not be used as Jasmarie Coppock sole basis for treatment. Nasal washings and aspirates are unacceptable for Xpert Xpress SARS-CoV-2/FLU/RSV testing.  Fact Sheet for Patients: bloggercourse.com  Fact Sheet for Healthcare Providers: seriousbroker.it  This test is not yet approved or cleared by the [place]  FDA and has been authorized for detection and/or diagnosis of SARS-CoV-2 by FDA under an Emergency Use Authorization (EUA). This EUA will remain in effect (meaning this test can be used) for the duration of the COVID-19 declaration under Section 564(b)(1) of the Act, 21 U.S.C. section 360bbb-3(b)(1), unless the authorization is terminated or revoked.     Resp Syncytial Virus by PCR NEGATIVE NEGATIVE Final    Comment: (NOTE) Fact Sheet for Patients: bloggercourse.com  Fact  Sheet for Healthcare Providers: seriousbroker.it  This test is not yet approved or cleared by the [place]  FDA and has been authorized for detection and/or diagnosis of SARS-CoV-2 by FDA under an Emergency Use Authorization (EUA). This EUA will remain in effect (meaning this test can be used) for the duration of the COVID-19 declaration under Section 564(b)(1) of the Act, 21 U.S.C. section 360bbb-3(b)(1), unless the authorization is terminated or revoked.  Performed at [facility], [address] [place], [place] [address]   Blood Culture (routine x 2)     Status: None (Preliminary result)   Collection Time: 06/17/23  7:38 PM   Specimen: BLOOD  Result Value Ref Range Status   Specimen Description BLOOD RIGHT ANTECUBITAL  Final   Special Requests   Final    BOTTLES DRAWN AEROBIC ONLY Blood Culture adequate volume  Culture   Final    NO GROWTH 2 DAYS Performed at [facility], [address] [place], [place] [address]    Report Status PENDING  Incomplete  Blood Culture (routine x 2)     Status: None (Preliminary result)   Collection Time: 06/17/23  7:56 PM   Specimen: BLOOD  Result Value Ref Range Status   Specimen Description BLOOD LEFT ANTECUBITAL  Final   Special Requests   Final    BOTTLES DRAWN AEROBIC AND ANAEROBIC Blood Culture adequate volume   Culture   Final    NO GROWTH 2 DAYS Performed at [facility], [address], [place], [place] [address]    Report Status PENDING  Incomplete     Labs: Basic Metabolic Panel: Recent Labs  Lab 06/17/23 1938 06/18/23 0439 06/19/23 0411  NA 133* 133* 135  K 3.8 4.1 3.5  CL 101 101 99  CO2 22 22 27   GLUCOSE 160* 191* 128*  BUN 19 17 22   CREATININE 0.90 0.88 0.87  CALCIUM  9.7 9.0 9.1  MG  --   --  2.0  PHOS  --   --  3.0   Liver Function Tests: Recent Labs  Lab 06/17/23 1938 06/18/23 0439 06/19/23 0411  AST 20 23 22   ALT 23 25 22   ALKPHOS 58 53 49  BILITOT 0.6 0.6 0.6  PROT 7.4  6.6 6.9  ALBUMIN 3.9 3.4* 3.4*   No results for input(s): LIPASE, AMYLASE in the last 168 hours. No results for input(s): AMMONIA in the last 168 hours. CBC: Recent Labs  Lab 06/17/23 1938 06/18/23 0439 06/19/23 0411  WBC 8.4 6.2 5.1  NEUTROABS  --   --  3.1  HGB 14.8 13.9 14.1  HCT 46.1* 43.9 43.6  MCV 83.2 83.9 84.0  PLT 219 194 201   Cardiac Enzymes: No results for input(s): CKTOTAL, CKMB, CKMBINDEX, TROPONINI in the last 168 hours. BNP: BNP (last 3 results) No results for input(s): BNP in the last 8760 hours.  ProBNP (last 3 results) No results for input(s): PROBNP in the last 8760 hours.  CBG: Recent Labs  Lab 06/18/23 1124 06/18/23 1627 06/18/23 2053 06/19/23 0814 06/19/23 1153  GLUCAP 186* 199* 258* 146* 256*       Signed:  Meliton Monte MD.  [facility] 06/19/2023, 12:55 PM

## 2023-06-22 ENCOUNTER — Telehealth: Payer: Self-pay

## 2023-06-22 LAB — CULTURE, BLOOD (ROUTINE X 2)
Culture: NO GROWTH
Culture: NO GROWTH
Special Requests: ADEQUATE
Special Requests: ADEQUATE

## 2023-06-22 NOTE — Transitions of Care (Post Inpatient/ED Visit) (Signed)
 06/22/2023  Name: [name] MRN: [id number] DOB: 04-10-1961  Today's TOC FU Call Status: Today's TOC FU Call Status:: Successful TOC FU Call Completed TOC FU Call Complete Date: 06/22/23 Patient's Name and Date of Birth confirmed.  Transition Care Management Follow-up Telephone Call Date of Discharge: 06/19/23 [facility] Type of Discharge: Inpatient Admission Primary Inpatient Discharge Diagnosis:: Influenza A How have you been since you were released from the hospital?: Better (She is currenty at work) Any questions or concerns?: No  Items Reviewed: Did you receive and understand the discharge instructions provided?: Yes Medications obtained,verified, and reconciled?: Yes (Medications Reviewed) (Medication reconciliation completed based on recent discharge summary Patient taking medications as instructed and is aware of any changes or dosage adjustments medication regimen. Patient denies questions and reports no barriers to medication adherence) Any new allergies since your discharge?: No Dietary orders reviewed?: Yes Type of Diet Ordered:: Reg Do you have support at home?: Yes People in Home: spouse Name of Support/Comfort Primary Source: [name]  Medications Reviewed Today: Medications Reviewed Today     Reviewed by Brittney Camelia CROME, RN (Registered Nurse) on 06/22/23 at 1227  Med List Status: <None>   Medication Order Taking? Sig Documenting Provider Last Dose Status Informant  acetaZOLAMIDE (DIAMOX) 250 MG tablet [id number] Yes Take 250 mg by mouth 3 (three) times daily. [provider] Taking Active Self  aspirin  81 MG EC tablet [id number] Yes Take 1 tablet by mouth daily. [provider] Taking Active Self  atenolol  (TENORMIN ) 25 MG tablet [id number] Yes Take 25 mg by mouth daily. [provider] Taking Active Self  B-D UF III MINI PEN NEEDLES 31G X 5 MM MISC [id number] Yes USE ONCE DAILY AS DIRECTED Nida,  Gebreselassie W, MD Taking Active Self  Continuous Blood Gluc Sensor (DEXCOM G7 SENSOR) MISC [id number] Yes Change sensor every 10 days. Use to check glucose as directed. Nida, Gebreselassie W, MD Taking Active Self  Continuous Glucose Sensor (DEXCOM G7 SENSOR) [place] [id number]  CHANGE THE SENSOR EVERY 10 DAYS Nida, Gebreselassie W, MD  Active Self           Med Note DARRICK, Rosalba Totty L   Mon Jun 22, 2023 12:27 PM) Duplicate   diclofenac sodium (VOLTAREN) 1 % GEL [id number] Yes Apply 2 g topically 4 (four) times daily as needed (pain). [provider] Taking Active Self  empagliflozin  (JARDIANCE ) 10 MG TABS tablet [id number] Yes Take 1 tablet (10 mg total) by mouth daily before breakfast. Nida, Gebreselassie W, MD Taking Active   fluticasone  (FLONASE ) 50 MCG/ACT nasal spray [id number] Yes Place 2 sprays into both nostrils daily. [provider] Taking Active Self  Fluticasone -Salmeterol (ADVAIR) 250-50 MCG/DOSE AEPB [id number] Yes Inhale 1 puff into the lungs 2 (two) times daily.  [provider] Taking Active Self  insulin  aspart protamine - aspart (NOVOLOG  MIX 70/30 FLEXPEN) (70-30) 100 UNIT/ML FlexPen [id number] Yes INJECT 70 UNITS AT BREAKFAST AND 70 UNITS AT SUPPER DAILY AS DIRECTED.  Patient taking differently: Inject 60 Units into the skin 2 (two) times daily with a meal. INJECT 70 UNITS AT BREAKFAST AND 70 UNITS AT SUPPER DAILY AS DIRECTED.   Nida, Gebreselassie W, MD Taking Active Self  ipratropium-albuterol  (DUONEB) 0.5-2.5 (3) MG/3ML SOLN [id number] Yes Take 3 mLs by nebulization every 4 (four) hours as needed (Shortness of Breath).  [provider] Taking Active Self  losartan -hydrochlorothiazide  (HYZAAR) 100-25 MG tablet [id number] Yes Take 1 tablet by mouth daily. [provider] Taking  Active Self  metFORMIN  (GLUCOPHAGE ) 500 MG tablet [id number] Yes TAKE 1 TABLET BY MOUTH TWICE A DAY WITH FOOD Nida, Gebreselassie W, MD Taking Active Self  Multiple Vitamin  (MULTIVITAMIN WITH MINERALS) TABS tablet [id number] Yes Take 1 tablet by mouth daily. [provider] Taking Active Self  oseltamivir  (TAMIFLU ) 75 MG capsule [id number] Yes Take 1 capsule (75 mg total) by mouth 2 (two) times daily for 3 days. Perri DELENA Meliton Mickey., MD Taking Active   rosuvastatin  (CRESTOR ) 20 MG tablet [id number] Yes Take 0.5 tablets (10 mg total) by mouth daily. Vicci Afton CROME, MD Taking Active Self          Medication reconciliation / review completed based on most recent discharge summary and EHR medication list. Confirmed patient is taking all newly prescribed medications as instructed and is aware of any changes to and / or dosage adjustments to medication regimen. Patient denies questions at this time and reports no barriers to medication adherence.   Home Care and Equipment/Supplies: Were Home Health Services Ordered?: No Any new equipment or medical supplies ordered?: No  Functional Questionnaire: Do you need assistance with bathing/showering or dressing?: No Do you need assistance with meal preparation?: No Do you need assistance with eating?: No Do you have difficulty maintaining continence: No Do you need assistance with getting out of bed/getting out of a chair/moving?: No Do you have difficulty managing or taking your medications?: No  Follow up appointments reviewed: PCP Follow-up appointment confirmed?: No (She will call and schedule) MD Provider Line Number:[phone] Given: No Specialist Hospital Follow-up appointment confirmed?: Yes Date of Specialist follow-up appointment?: 07/02/23 Follow-Up Specialty Provider:: Endocrinology Do you need transportation to your follow-up appointment?: No (She drives) Do you understand care options if your condition(s) worsen?: Yes-patient verbalized understanding  SDOH Interventions Today    Flowsheet Row Most Recent Value  SDOH Interventions   Food Insecurity Interventions Intervention Not Indicated   Housing Interventions Intervention Not Indicated  Transportation Interventions Intervention Not Indicated, Patient Resources (Friends/Family)  Utilities Interventions Intervention Not Indicated  Social Connections Interventions Intervention Not Indicated       Goals Addressed             This Visit's Progress    TOC Care Pan       Current Barriers:  Medication management New Tamiflu  and Prednisone  s/p + Influenza A  Provider appointments wit PP and Endocrinology   RNCM Clinical Goal(s):  Patient will take all medications exactly as prescribed and will call provider for medication related questions as evidenced by no missed medication doses attend all scheduled medical appointments: with PCP and Follow-up with Endocrinology  as evidenced by no missed appointments   through collaboration with RN Care manager, provider, and care team.   Interventions: Evaluation of current treatment plan related to  self management and patient's adherence to plan as established by provider  Transitions of Care:  New goal. Doctor Visits  - discussed the importance of doctor visits Post discharge activity limitations prescribed by provider reviewed Reviewed Signs and symptoms of infection  Patient Goals/Self-Care Activities: Participate in Transition of Care Program/Attend TOC scheduled calls Take all medications as prescribed Attend all scheduled provider appointments Call pharmacy for medication refills 3-7 days in advance of running out of medications Perform all self care activities independently  Perform IADL's (shopping, preparing meals, housekeeping, managing finances) independently Call provider office for new concerns or questions   Follow Up Plan:  Telephone follow up appointment with care management team member  scheduled for:  06/30/23 @ 4:00pm  The patient has been provided with contact information for the care management team and has been advised to call with any health related  questions or concerns.          Patient is at high risk for readmission and/or has history of  high utilization  Discussed VBCI  TOC program and weekly calls to patient to assess condition/status, medication management  and provide support/education as indicated . Patient/ Caregiver voiced understanding and is  agreeable to 30 day program    Routine follow-up and on-going assessment evaluation and education of disease processes, recommended interventions for both chronic and acute medical conditions , will occur during each weekly visit along with ongoing review of symptoms ,medication reviews and reconciliation. Any updates , inconsistencies, discrepancies or acute care concerns will be addressed and routed to the correct Practitioner if indicated   Based on current information and Insurance plan -Reviewed benefits available to patient, including details about eligibility options for care if any area of needs were identified.  Reviewed patients ability to access and / or navigating the benefits system..Amb Referral made if indicted , refer to orders section of note for details   Please refer to Care Plan for goals and interventions -Effectiveness of interventions, symptom management and outcomes will be evaluated  weekly during [facility] 30-day Program Outreach calls  . Any necessary  changes and updates to Care Plan will be completed episodically    Reviewed goals for care Patient verbalizes understanding of instructions and care plan provided. Patient was encouraged to make informed decisions about their care, actively participate in managing their health condition, and implement lifestyle changes as needed to promote independence and self-management of health care    The patient has been provided with contact information for the care management team and has been advised to call with any health-related questions or concerns.  Bari Mayans , BSN, RN Care Management Coordinator [place]    [facility] [email or web address] Direct Dial : [phone]

## 2023-06-22 NOTE — Patient Instructions (Signed)
 Visit Information  Thank you for taking time to visit with me today. Please don't hesitate to contact me if I can be of assistance to you before our next scheduled telephone appointment.  Our next appointment is by telephone on 06/30/23 at 4:00pm  Following is a copy of your care plan:   Goals Addressed             This Visit's Progress    TOC Care Pan       Current Barriers:  Medication management New Tamiflu  and Prednisone  s/p + Influenza A  Provider appointments wit PP and Endocrinology   RNCM Clinical Goal(s):  Patient will take all medications exactly as prescribed and will call provider for medication related questions as evidenced by no missed medication doses attend all scheduled medical appointments: with PCP and Follow-up with Endocrinology  as evidenced by no missed appointments   through collaboration with RN Care manager, provider, and care team.   Interventions: Evaluation of current treatment plan related to  self management and patient's adherence to plan as established by provider  Transitions of Care:  New goal. Doctor Visits  - discussed the importance of doctor visits Post discharge activity limitations prescribed by provider reviewed Reviewed Signs and symptoms of infection  Patient Goals/Self-Care Activities: Participate in Transition of Care Program/Attend TOC scheduled calls Take all medications as prescribed Attend all scheduled provider appointments Call pharmacy for medication refills 3-7 days in advance of running out of medications Perform all self care activities independently  Perform IADL's (shopping, preparing meals, housekeeping, managing finances) independently Call provider office for new concerns or questions   Follow Up Plan:  Telephone follow up appointment with care management team member scheduled for:  06/30/23 @ 4:00pm  The patient has been provided with contact information for the care management team and has been advised to call with  any health related questions or concerns.           Reviewed goals for care Patient/ Caregiver verbalizes understanding of instructions with the plan of care . The  Patient / Caregiver was encouraged to make informed decisions about care, actively participate in managing health conditions, and implement lifestyle changes as needed to promote independence and self-management of healthcare. SDOH screenings have been completed and addressed if indicted There are no reported barriers to care.    Follow-up Plan [facility] Case Management Nurse will provide follow-up and on-going assessment ,evaluation and education of disease processes, recommended interventions for both chronic and acute medical conditions ,  along with ongoing review of symptoms ,medication reviews / reconciliation during each weekly call . Any updates , inconsistencies, discrepancies or acute care concerns will be addressed and routed to the correct Practitioner if indicated   [facility]  Please call the care guide team at [phone]  if you need to cancel or reschedule your appointment . For scheduled calls -Three attempts will be made to reach you -if the scheduled call is missed or  we are unable to reach the you after 3 attempts no additional outreach attempts will be made and the TOC follow-up will be closed .   If you need to speak to a Nurse you may  call me directly at the number below or if I am unavailable,and  your need is urgent  please call the main [facility] number at [phone] and ask to speak with one of the [facility] ( Transition of Care )  Nurses  .  Additionally, If you experience worsening of your symptoms, develop shortness of breath, If you are experiencing a medical emergency,  develop suicidal or homicidal thoughts you must seek medical attention immediately by calling 911 or report to your local emergency department or urgent  care.   If you have a non-emergency medical problem during routine business hours, please contact your provider's office and ask to speak with a nurse.       Please take the time to read instructions/literature along with the possible adverse reactions/side effects for all the Medicines that have been prescribed to you. Only take newly prescribed  Medications after you have completely understood and accept all the possible adverse reactions/side effects.   Do not take more than prescribed Medications for  Pain, Sleep and Anxiety. Do not drive when taking Pain medications or sleep aid/ insomnia  medications It is not advisable to combine anxiety, sleep and pain medications without talking with your primary care practitioner    If you are experiencing a Mental Health or Behavioral Health Crisis or need someone to talk to Please call the Suicide and Crisis Lifeline: 80 You may also call the [place]  National Suicide Prevention Lifeline: 7873264392 or TTY: 386 743 0820 TTY 657-626-5668) to talk to a trained counselor.  You may call the Behavioral Health Crisis Line at 202-079-1732, at any time, 24 hours a day, 7 days a week- however If you are in danger or need immediate medical attention, call 911.   If you would like help to quit smoking, call 1-800-QUIT-NOW ( 430-679-5531) OR Espaol: 1-855-Djelo-Ya (8-144-664-6430) o para ms informacin haga clic aqu or Text READY to 799-599 to register via text.   Bari Mayans , BSN, RN Care Management Coordinator [place]   [facility] [email or web address] Direct Dial : [phone]

## 2023-06-30 ENCOUNTER — Other Ambulatory Visit: Payer: Self-pay

## 2023-06-30 NOTE — Patient Outreach (Signed)
Care Management  Transitions of Care Program Transitions of Care Post-discharge week 2 and final call    06/30/2023 Name: [name] MRN: [id number] DOB: Jan 10, 1961  Subjective: [name] is a 63 y.o. year old female who is a primary care patient of Fanta, Wayland Salinas, MD. The Care Management team Engaged with patient Engaged with patient by telephone to assess and address transitions of care needs.   Consent to Services:  Patient was given information about care management services, agreed to services, and gave verbal consent to participate.   Assessment:  Patient/ Caregiver  voices no new complaints or concerns  and has not developed/ reported any new medical issues / Dx or acute changes. - since last follow-up call for most recent  Hospital stay    1/8-1/10// 2025 She is recovering well s/p Influenza A .Dhe is back to usual routine , working since last week. She dies report a lingering cough ad is using Robitussin OTC . She has Endocrinology follow-up 1/23 and a PCP appointment 1/28 She will consider tessalon Pearles or Robitussin with Codeine if cough persists and there are no contraindications. She completed Tamiflu and Prednisone . Her BG is WNL usually under 200 based on diet Intake is good she does feel the Jandiance keeps her appetite down  . She uses breathing treatments with effect and stated overall she feel pretty good.  Medication reconciliation / review completed based on most recent discharge summary and EHR medication list. Confirmed patient is taking all newly prescribed medications as instructed (any discrepancies are noted in review section)   Patient / Caregiver is aware of any changes to and / or  any dosage adjustments to medication regimen. Patient/ Caregiver denies questions at this time and reports no barriers to medication adherence  Patient / Caregiver educated on red flag s/s to watch for and was encouraged to report, any changes in baseline or  medication  regimen,  changes in health status  /  well-being, safety concerns  or any new unmanaged side effects or symptoms not relieved with interventions  to PCP and / or the  [facility] Case Management team           SDOH Interventions    Flowsheet Row Telephone from 06/22/2023 in [place] [facility]PART[facility] Nutrition from 01/01/2021 in [facility] at [employer]  SDOH Interventions    Food Insecurity Interventions Intervention Not Indicated --  Housing Interventions Intervention Not Indicated --  Transportation Interventions Intervention Not Indicated, Patient Resources Dietitian) --  Utilities Interventions Intervention Not Indicated --  Depression Interventions/Treatment  -- Counseling  Social Connections Interventions Intervention Not Indicated --        Goals Addressed             This Visit's Progress    COMPLETED: TOC Care Pan       Current Barriers:  Medication management New Tamiflu and Prednisone s/p + Influenza A  Provider appointments wit PP and Endocrinology   RNCM Clinical Goal(s):  Patient will take all medications exactly as prescribed and will call provider for medication related questions as evidenced by no missed medication doses attend all scheduled medical appointments: with PCP and Follow-up with Endocrinology  as evidenced by no missed appointments   through collaboration with RN Care manager, provider, and care team.   Interventions: Evaluation of current treatment plan related to  self management and patient's adherence to plan as established by provider  Transitions of Care:  Goal Met. Doctor Visits  - discussed the importance of doctor visits Post discharge activity limitations prescribed by provider reviewed Reviewed Signs and symptoms of infection  Patient Goals/Self-Care Activities: Participate in Transition of Care Program/Attend TOC scheduled calls Take all medications as prescribed Attend all  scheduled provider appointments Call pharmacy for medication refills 3-7 days in advance of running out of medications Perform all self care activities independently  Perform IADL's (shopping, preparing meals, housekeeping, managing finances) independently Call provider office for new concerns or questions   Follow Up Plan:  The patient has been provided with contact information for the care management team and has been advised to call with any health related questions or concerns.  No further follow up required: patient is doing well stated no additional follow-up is needed           Plan:  The patient has completed the  [facility] Program with the 2nd call and stated she did not feel she needed additional follow-up Condition is stable No further acute needs identified at this time. Chronic conditions and ongoing care is  managed thru collaboration with  PCP,  Specialists and additional Healthcare Providers if indicated . Patient verbalized understanding of ongoing plan of care.  SDOH needs have been screened and interventions provided if identified.  Reviewed current home medications -- provided education as needed.  Previously discussed rationale of use, how/when to take medications. Patient is aware of potential side effects, and was encouraged to notify PCP for any changes in condition or signs / symptoms not relieved  with interventions.   Patient will call 911 for Medical Emergencies or Life -Threatening or report to a local emergency department or urgent care.   Patient was encouraged to Contact PCP  with any questions or concerns regarding ongoing  medical care, any  difficulty obtaining or picking up  prescriptions, any  changes or  worsening in  condition including signs / symptoms not relieved  with interventions Follow up as indicated with the  Care Team , or sooner should any new problems arise.   Patient had no additional questions or concerns at this time. Current needs addressed.      The patient has been provided with contact information for the care management team and has been advised to call with any health related questions or concerns.   Susa Loffler , BSN, RN [facility]   [facility] RN Care Manager Direct Dial [phone] Fax [phone] Website: [email or web address]

## 2023-07-02 ENCOUNTER — Ambulatory Visit (INDEPENDENT_AMBULATORY_CARE_PROVIDER_SITE_OTHER): Payer: BC Managed Care – PPO | Admitting: Obstetrics & Gynecology

## 2023-07-02 ENCOUNTER — Other Ambulatory Visit (HOSPITAL_COMMUNITY)
Admission: RE | Admit: 2023-07-02 | Discharge: 2023-07-02 | Disposition: A | Discharge status: Home / Self Care | Payer: BC Managed Care – PPO | Source: Ambulatory Visit | Attending: Obstetrics & Gynecology | Admitting: Obstetrics & Gynecology

## 2023-07-02 ENCOUNTER — Ambulatory Visit: Payer: BC Managed Care – PPO | Admitting: "Endocrinology

## 2023-07-02 ENCOUNTER — Encounter: Payer: Self-pay | Admitting: Obstetrics & Gynecology

## 2023-07-02 VITALS — BP 143/79 | HR 76 | Ht 61.0 in | Wt 222.8 lb

## 2023-07-02 DIAGNOSIS — Z1151 Encounter for screening for human papillomavirus (HPV): Secondary | ICD-10-CM | POA: Diagnosis not present

## 2023-07-02 DIAGNOSIS — Z01419 Encounter for gynecological examination (general) (routine) without abnormal findings: Secondary | ICD-10-CM

## 2023-07-02 DIAGNOSIS — Z124 Encounter for screening for malignant neoplasm of cervix: Secondary | ICD-10-CM | POA: Diagnosis not present

## 2023-07-02 DIAGNOSIS — B3731 Acute candidiasis of vulva and vagina: Secondary | ICD-10-CM

## 2023-07-02 MED ORDER — FLUCONAZOLE 100 MG PO TABS: 100.0000 mg | ORAL_TABLET | Freq: Every day | ORAL | 0 refills | Status: DC

## 2023-07-02 NOTE — Progress Notes (Signed)
Subjective:     [name] is a 63 y.o. female here for a routine exam.  Patient's last menstrual period was 02/25/2019. [id number] Birth Control Method:  menopausal Menstrual Calendar(currently): amenorrhea  Current complaints: yeast vaginitis.   Current acute medical issues:  none   Recent Gynecologic History Patient's last menstrual period was 02/25/2019. Last Pap: 03/2019,  normal Last mammogram: 03/2022,  normal  Past Medical History:  Diagnosis Date   Allergy    Asthma    COPD (chronic obstructive pulmonary disease) (HCC)    Diabetes mellitus without complication (HCC)    Hypertension    IIH (idiopathic intracranial hypertension)    Morbid obesity due to excess calories (HCC)    Umbilical hernia     Past Surgical History:  Procedure Laterality Date   CARPAL TUNNEL RELEASE Right    CHOLECYSTECTOMY     COLONOSCOPY N/A 01/12/2015   Procedure: COLONOSCOPY;  Surgeon: West Bali, MD;  Location: [facility] END[facility] SUITE;  Service: Endoscopy;  Laterality: N/A;  0830   HYSTEROSCOPY WITH D & C N/A 04/01/2013   Procedure: DILATATION AND CURETTAGE /HYSTEROSCOPY;  Surgeon: Lazaro Arms, MD;  Location: [facility] ORS;  Service: Gynecology;  Laterality: N/A;   NASAL POLYP SURGERY      OB History     Gravida  2   Para  2   Term  2   Preterm      AB      Living  2      SAB      IAB      Ectopic      Multiple      Live Births  2           Social History   Socioeconomic History   Marital status: Married    Spouse name: Not on file   Number of children: 2   Years of education: Not on file   Highest education level: Not on file  Occupational History   Not on file  Tobacco Use   Smoking status: Former    Current packs/day: 0.00    Average packs/day: 0.5 packs/day for 20.0 years (10.0 ttl pk-yrs)    Types: Cigarettes    Start date: 03/25/1977    Quit date: 03/25/1997    Years since quitting: 26.2   Smokeless tobacco: Never  Vaping Use   Vaping status: Never  Used  Substance and Sexual Activity   Alcohol use: Yes    Alcohol/week: 0.0 standard drinks of alcohol    Comment: Ocassional   Drug use: No   Sexual activity: Yes    Birth control/protection: None, Post-menopausal  Other Topics Concern   Not on file  Social History Narrative   Not on file   Social Drivers of Health   Financial Resource Strain: Low Risk  (07/02/2023)   Overall Financial Resource Strain (CARDIA)    Difficulty of Paying Living Expenses: Not hard at all  Food Insecurity: No Food Insecurity (07/02/2023)   Hunger Vital Sign    Worried About Running Out of Food in the Last Year: Never true    Ran Out of Food in the Last Year: Never true  Transportation Needs: No Transportation Needs (07/02/2023)   PRAPARE - Administrator, Civil Service (Medical): No    Lack of Transportation (Non-Medical): No  Physical Activity: Inactive (07/02/2023)   Exercise Vital Sign    Days of Exercise per Week: 0 days    Minutes of Exercise per  Session: 0 min  Stress: No Stress Concern Present (07/02/2023)   [employer] of Occupational Health - Occupational Stress Questionnaire    Feeling of Stress : Not at all  Social Connections: Socially Integrated (07/02/2023)   Social Connection and Isolation Panel [NHANES]    Frequency of Communication with Friends and Family: More than three times a week    Frequency of Social Gatherings with Friends and Family: Once a week    Attends Religious Services: More than 4 times per year    Active Member of [employer] or Organizations: Yes    Attends Engineer, structural: More than 4 times per year    Marital Status: Married    Family History  Problem Relation Age of Onset   Diabetes Mother    Kidney disease Mother    Other Mother        renal failure   Hypertension Mother    Cancer Mother        ?uterine cancer    Diabetes Father    Cancer Father        pancreatic   Diabetes Sister    Kidney disease Sister    Diabetes  Maternal Grandmother    Asthma Maternal Grandmother    COPD Maternal Grandmother        bronchitis   Hypertension Maternal Grandmother    Thyroid disease Maternal Grandmother    Hypertension Daughter    Colon cancer Neg Hx      Current Outpatient Medications:    acetaZOLAMIDE (DIAMOX) 250 MG tablet, Take 250 mg by mouth 3 (three) times daily., Disp: , Rfl:    aspirin 81 MG EC tablet, Take 1 tablet by mouth daily., Disp: , Rfl:    atenolol (TENORMIN) 25 MG tablet, Take 25 mg by mouth daily., Disp: , Rfl:    B-D UF III MINI PEN NEEDLES 31G X 5 MM MISC, USE ONCE DAILY AS DIRECTED, Disp: 100 each, Rfl: 2   Continuous Blood Gluc Sensor (DEXCOM G7 SENSOR) MISC, Change sensor every 10 days. Use to check glucose as directed., Disp: 3 each, Rfl: 2   Continuous Glucose Sensor (DEXCOM G7 SENSOR) MISC, CHANGE THE SENSOR EVERY 10 DAYS, Disp: 3 each, Rfl: 2   diclofenac sodium (VOLTAREN) 1 % GEL, Apply 2 g topically 4 (four) times daily as needed (pain)., Disp: , Rfl:    empagliflozin (JARDIANCE) 10 MG TABS tablet, Take 1 tablet (10 mg total) by mouth daily before breakfast., Disp: 90 tablet, Rfl: 0   fluconazole (DIFLUCAN) 100 MG tablet, Take 1 tablet (100 mg total) by mouth daily., Disp: 7 tablet, Rfl: 0   fluticasone (FLONASE) 50 MCG/ACT nasal spray, Place 2 sprays into both nostrils daily., Disp: , Rfl:    Fluticasone-Salmeterol (ADVAIR) 250-50 MCG/DOSE AEPB, Inhale 1 puff into the lungs 2 (two) times daily. , Disp: , Rfl:    insulin aspart protamine - aspart (NOVOLOG MIX 70/30 FLEXPEN) (70-30) 100 UNIT/ML FlexPen, INJECT 70 UNITS AT BREAKFAST AND 70 UNITS AT SUPPER DAILY AS DIRECTED. (Patient taking differently: Inject 60 Units into the skin 2 (two) times daily with a meal. INJECT 70 UNITS AT BREAKFAST AND 70 UNITS AT SUPPER DAILY AS DIRECTED.), Disp: 120 mL, Rfl: 0   ipratropium-albuterol (DUONEB) 0.5-2.5 (3) MG/3ML SOLN, Take 3 mLs by nebulization every 4 (four) hours as needed (Shortness of  Breath). , Disp: , Rfl:    losartan-hydrochlorothiazide (HYZAAR) 100-25 MG tablet, Take 1 tablet by mouth daily., Disp: , Rfl:    metFORMIN (  GLUCOPHAGE) 500 MG tablet, TAKE 1 TABLET BY MOUTH TWICE A DAY WITH FOOD, Disp: 180 tablet, Rfl: 0   Multiple Vitamin (MULTIVITAMIN WITH MINERALS) TABS tablet, Take 1 tablet by mouth daily., Disp: , Rfl:    rosuvastatin (CRESTOR) 20 MG tablet, Take 0.5 tablets (10 mg total) by mouth daily., Disp: , Rfl:   Review of Systems  Review of Systems  Constitutional: Negative for fever, chills, weight loss, malaise/fatigue and diaphoresis.  HENT: Negative for hearing loss, ear pain, nosebleeds, congestion, sore throat, neck pain, tinnitus and ear discharge.   Eyes: Negative for blurred vision, double vision, photophobia, pain, discharge and redness.  Respiratory: Negative for cough, hemoptysis, sputum production, shortness of breath, wheezing and stridor.   Cardiovascular: Negative for chest pain, palpitations, orthopnea, claudication, leg swelling and PND.  Gastrointestinal: negative for abdominal pain. Negative for heartburn, nausea, vomiting, diarrhea, constipation, blood in stool and melena.  Genitourinary: Negative for dysuria, urgency, frequency, hematuria and flank pain.  Musculoskeletal: Negative for myalgias, back pain, joint pain and falls.  Skin: Negative for itching and rash.  Neurological: Negative for dizziness, tingling, tremors, sensory change, speech change, focal weakness, seizures, loss of consciousness, weakness and headaches.  Endo/Heme/Allergies: Negative for environmental allergies and polydipsia. Does not bruise/bleed easily.  Psychiatric/Behavioral: Negative for depression, suicidal ideas, hallucinations, memory loss and substance abuse. The patient is not nervous/anxious and does not have insomnia.        Objective:  Blood pressure (!) 143/79, pulse 76, height 5\' 1"  (1.549 m), weight 222 lb 12.8 oz (101.1 kg), last menstrual period  02/25/2019.   Physical Exam  Vitals reviewed. Constitutional: She is oriented to person, place, and time. She appears well-developed and well-nourished.  HENT:  Head: Normocephalic and atraumatic.        Right Ear: External ear normal.  Left Ear: External ear normal.  Nose: Nose normal.  Mouth/Throat: Oropharynx is clear and moist.  Eyes: Conjunctivae and EOM are normal. Pupils are equal, round, and reactive to light. Right eye exhibits no discharge. Left eye exhibits no discharge. No scleral icterus.  Neck: Normal range of motion. Neck supple. No tracheal deviation present. No thyromegaly present.  Cardiovascular: Normal rate, regular rhythm, normal heart sounds and intact distal pulses.  Exam reveals no gallop and no friction rub.   No murmur heard. Respiratory: Effort normal and breath sounds normal. No respiratory distress. She has no wheezes. She has no rales. She exhibits no tenderness.  GI: Soft. Bowel sounds are normal. She exhibits no distension and no mass. There is no tenderness. There is no rebound and no guarding.  Genitourinary:  Breasts no masses skin changes or nipple changes bilaterally      Vulva is normal without lesions Vagina is pink moist without discharge Cervix normal in appearance and pap is done Uterus is normal size shape and contour Adnexa is negative with normal sized ovaries   Musculoskeletal: Normal range of motion. She exhibits no edema and no tenderness.  Neurological: She is alert and oriented to person, place, and time. She has normal reflexes. She displays normal reflexes. No cranial nerve deficit. She exhibits normal muscle tone. Coordination normal.  Skin: Skin is warm and dry. No rash noted. No erythema. No pallor.  Psychiatric: She has a normal mood and affect. Her behavior is normal. Judgment and thought content normal.       Medications Ordered at today's visit: Meds ordered this encounter  Medications   fluconazole (DIFLUCAN) 100 MG tablet  Sig: Take 1 tablet (100 mg total) by mouth daily.    Dispense:  7 tablet    Refill:  0    Other orders placed at today's visit: No orders of the defined types were placed in this encounter.    ASSESSMENT + PLAN:    ICD-10-CM   1. Well woman exam with routine gynecological exam  Z01.419     2. Encounter for Pap smear of cervix with HPV DNA cotesting  Z12.4 Cytology - PAP( [place])    3. Yeast vaginitis due to jardiance  B37.31           Return in about 5 years (around 07/01/2028) for yearly.

## 2023-07-07 ENCOUNTER — Encounter: Payer: Self-pay | Admitting: Obstetrics & Gynecology

## 2023-07-07 DIAGNOSIS — J9621 Acute and chronic respiratory failure with hypoxia: Secondary | ICD-10-CM | POA: Diagnosis not present

## 2023-07-07 DIAGNOSIS — I10 Essential (primary) hypertension: Secondary | ICD-10-CM | POA: Diagnosis not present

## 2023-07-07 DIAGNOSIS — J4530 Mild persistent asthma, uncomplicated: Secondary | ICD-10-CM | POA: Diagnosis not present

## 2023-07-07 DIAGNOSIS — E1165 Type 2 diabetes mellitus with hyperglycemia: Secondary | ICD-10-CM | POA: Diagnosis not present

## 2023-07-07 LAB — CYTOLOGY - PAP
Comment: NEGATIVE
Diagnosis: NEGATIVE
High risk HPV: NEGATIVE

## 2023-07-10 DIAGNOSIS — G932 Benign intracranial hypertension: Secondary | ICD-10-CM | POA: Diagnosis not present

## 2023-07-10 DIAGNOSIS — G9600 Cerebrospinal fluid leak, unspecified: Secondary | ICD-10-CM | POA: Diagnosis not present

## 2023-07-14 ENCOUNTER — Encounter: Payer: Self-pay | Admitting: "Endocrinology

## 2023-07-14 ENCOUNTER — Ambulatory Visit (INDEPENDENT_AMBULATORY_CARE_PROVIDER_SITE_OTHER): Payer: BC Managed Care – PPO | Admitting: "Endocrinology

## 2023-07-14 VITALS — BP 124/64 | HR 80 | Ht 61.0 in | Wt 223.8 lb

## 2023-07-14 DIAGNOSIS — I10 Essential (primary) hypertension: Secondary | ICD-10-CM | POA: Diagnosis not present

## 2023-07-14 DIAGNOSIS — Z794 Long term (current) use of insulin: Secondary | ICD-10-CM | POA: Diagnosis not present

## 2023-07-14 DIAGNOSIS — E782 Mixed hyperlipidemia: Secondary | ICD-10-CM | POA: Diagnosis not present

## 2023-07-14 DIAGNOSIS — E1165 Type 2 diabetes mellitus with hyperglycemia: Secondary | ICD-10-CM | POA: Diagnosis not present

## 2023-07-14 MED ORDER — NOVOLOG MIX 70/30 FLEXPEN (70-30) 100 UNIT/ML ~~LOC~~ SUPN: PEN_INJECTOR | SUBCUTANEOUS | 1 refills | Status: DC

## 2023-07-14 NOTE — Progress Notes (Signed)
 07/14/2023, 5:49 PM   Endocrinology follow-up note   Subjective:    Patient ID: [name], female    DOB: 10-Apr-1961.  [name] is being seen in follow-up in the management of her currently uncontrolled type 2 diabetes, hypertension. EFI:Qjwuj, Benita Area, MD.   Past Medical History:  Diagnosis Date   Allergy    Asthma    COPD (chronic obstructive pulmonary disease) (HCC)    Diabetes mellitus without complication (HCC)    Hypertension    IIH (idiopathic intracranial hypertension)    Morbid obesity due to excess calories (HCC)    Umbilical hernia     Past Surgical History:  Procedure Laterality Date   CARPAL TUNNEL RELEASE Right    CHOLECYSTECTOMY     COLONOSCOPY N/A 01/12/2015   Procedure: COLONOSCOPY;  Surgeon: Margo CROME Haddock, MD;  Location: [facility];  Service: Endoscopy;  Laterality: N/A;  0830   HYSTEROSCOPY WITH D & C N/A 04/01/2013   Procedure: DILATATION AND CURETTAGE /HYSTEROSCOPY;  Surgeon: Vonn VEAR Inch, MD;  Location: [facility];  Service: Gynecology;  Laterality: N/A;   NASAL POLYP SURGERY      Social History   Socioeconomic History   Marital status: Married    Spouse name: Not on file   Number of children: 2   Years of education: Not on file   Highest education level: Not on file  Occupational History   Not on file  Tobacco Use   Smoking status: Former    Cohen packs/day: 0.00    Average packs/day: 0.5 packs/day for 20.0 years (10.0 ttl pk-yrs)    Types: Cigarettes    Start date: 03/25/1977    Quit date: 03/25/1997    Years since quitting: 26.3   Smokeless tobacco: Never  Vaping Use   Vaping status: Never Used  Substance and Sexual Activity   Alcohol use: Yes    Alcohol/week: 0.0 standard drinks of alcohol    Comment: Ocassional   Drug use: No   Sexual activity: Yes    Birth control/protection: None, Post-menopausal  Other Topics Concern   Not on file   Social History Narrative   Not on file   Social Drivers of Health   Financial Resource Strain: Low Risk  (07/02/2023)   Overall Financial Resource Strain (CARDIA)    Difficulty of Paying Living Expenses: Not hard at all  Food Insecurity: No Food Insecurity (07/02/2023)   Hunger Vital Sign    Worried About Running Out of Food in the Last Year: Never true    Ran Out of Food in the Last Year: Never true  Transportation Needs: No Transportation Needs (07/02/2023)   PRAPARE - Administrator, Civil Service (Medical): No    Lack of Transportation (Non-Medical): No  Physical Activity: Inactive (07/02/2023)   Exercise Vital Sign    Days of Exercise per Week: 0 days    Minutes of Exercise per Session: 0 min  Stress: No Stress Concern Present (07/02/2023)   [employer] of Occupational Health - Occupational Stress Questionnaire    Feeling of Stress : Not at all  Social Connections: Socially Integrated (07/02/2023)   Social Connection and  Isolation Panel [NHANES]    Frequency of Communication with Friends and Family: More than three times a week    Frequency of Social Gatherings with Friends and Family: Once a week    Attends Religious Services: More than 4 times per year    Active Member of [employer] or Organizations: Yes    Attends Engineer, Structural: More than 4 times per year    Marital Status: Married    Family History  Problem Relation Age of Onset   Diabetes Mother    Kidney disease Mother    Other Mother        renal failure   Hypertension Mother    Cancer Mother        ?uterine cancer    Diabetes Father    Cancer Father        pancreatic   Diabetes Sister    Kidney disease Sister    Diabetes Maternal Grandmother    Asthma Maternal Grandmother    COPD Maternal Grandmother        bronchitis   Hypertension Maternal Grandmother    Thyroid  disease Maternal Grandmother    Hypertension Daughter    Colon cancer Neg Hx     Outpatient Encounter  Medications as of 07/14/2023  Medication Sig   acetaZOLAMIDE (DIAMOX) 250 MG tablet Take 250 mg by mouth 3 (three) times daily.   aspirin  81 MG EC tablet Take 1 tablet by mouth daily.   atenolol  (TENORMIN ) 25 MG tablet Take 25 mg by mouth daily.   B-D UF III MINI PEN NEEDLES 31G X 5 MM MISC USE ONCE DAILY AS DIRECTED   Continuous Blood Gluc Sensor (DEXCOM G7 SENSOR) MISC Change sensor every 10 days. Use to check glucose as directed.   Continuous Glucose Sensor (DEXCOM G7 SENSOR) MISC CHANGE THE SENSOR EVERY 10 DAYS   diclofenac sodium (VOLTAREN) 1 % GEL Apply 2 g topically 4 (four) times daily as needed (pain).   empagliflozin  (JARDIANCE ) 10 MG TABS tablet Take 1 tablet (10 mg total) by mouth daily before breakfast.   fluconazole  (DIFLUCAN ) 100 MG tablet Take 1 tablet (100 mg total) by mouth daily.   fluticasone  (FLONASE ) 50 MCG/ACT nasal spray Place 2 sprays into both nostrils daily.   Fluticasone -Salmeterol (ADVAIR) 250-50 MCG/DOSE AEPB Inhale 1 puff into the lungs 2 (two) times daily.    insulin  aspart protamine - aspart (NOVOLOG  MIX 70/30 FLEXPEN) (70-30) 100 UNIT/ML FlexPen INJECT 70 UNITS AT BREAKFAST AND 70 UNITS AT SUPPER DAILY AS DIRECTED.   ipratropium-albuterol  (DUONEB) 0.5-2.5 (3) MG/3ML SOLN Take 3 mLs by nebulization every 4 (four) hours as needed (Shortness of Breath).    losartan -hydrochlorothiazide  (HYZAAR) 100-25 MG tablet Take 1 tablet by mouth daily.   metFORMIN  (GLUCOPHAGE ) 500 MG tablet TAKE 1 TABLET BY MOUTH TWICE A DAY WITH FOOD   Multiple Vitamin (MULTIVITAMIN WITH MINERALS) TABS tablet Take 1 tablet by mouth daily.   rosuvastatin  (CRESTOR ) 20 MG tablet Take 0.5 tablets (10 mg total) by mouth daily.   [DISCONTINUED] insulin  aspart protamine - aspart (NOVOLOG  MIX 70/30 FLEXPEN) (70-30) 100 UNIT/ML FlexPen INJECT 70 UNITS AT BREAKFAST AND 70 UNITS AT SUPPER DAILY AS DIRECTED. (Patient taking differently: Inject 60 Units into the skin 2 (two) times daily with a meal. INJECT 70  UNITS AT BREAKFAST AND 70 UNITS AT SUPPER DAILY AS DIRECTED.)   No facility-administered encounter medications on file as of 07/14/2023.    ALLERGIES: No Known Allergies  VACCINATION STATUS: Immunization History  Administered Date(s)  Administered   Influenza-Unspecified 06/09/2010, 06/09/2013   Pneumococcal Polysaccharide-23 08/05/2021    Diabetes She presents for her follow-up diabetic visit. She has type 2 diabetes mellitus. Onset time: She was diagnosed at approximate age of 40 years. Her disease course has been worsening. There are no hypoglycemic associated symptoms. Pertinent negatives for hypoglycemia include no confusion, headaches, pallor or seizures. Pertinent negatives for diabetes include no chest pain, no fatigue, no polydipsia, no polyphagia and no polyuria. There are no hypoglycemic complications. Symptoms are improving. There are no diabetic complications. Risk factors for coronary artery disease include diabetes mellitus, dyslipidemia, family history, obesity, tobacco exposure, sedentary lifestyle, post-menopausal and hypertension. Cohen diabetic treatment includes insulin  injections. Her weight is fluctuating minimally. She is following a generally unhealthy diet. When asked about meal planning, she reported none. She has not had a previous visit with a dietitian. She rarely participates in exercise. Her home blood glucose trend is increasing steadily. Her breakfast blood glucose range is generally 180-200 mg/dl. Her lunch blood glucose range is generally 140-180 mg/dl. Her dinner blood glucose range is generally 140-180 mg/dl. Her bedtime blood glucose range is generally 180-200 mg/dl. Her overall blood glucose range is 180-200 mg/dl. (She presents with slight increase in her A1c to 8.5% from 8.3%.  However, there is an overall improvement from 12.2%.  Her CGM reveals 53% time range, 32% level 1 hyperglycemia, 50% level 2 hyperglycemia.  Her average blood glucose is 184 mg per DL.   She did not document any hypoglycemia.   ) An ACE inhibitor/angiotensin II receptor blocker is being taken.  Hyperlipidemia This is a chronic problem. The Cohen episode started more than 1 year ago. The problem is controlled. Exacerbating diseases include diabetes and obesity. Pertinent negatives include no chest pain, myalgias or shortness of breath. Cohen antihyperlipidemic treatment includes statins. Risk factors for coronary artery disease include diabetes mellitus, dyslipidemia, family history, hypertension, obesity, a sedentary lifestyle and post-menopausal.  Hypertension This is a chronic problem. The Cohen episode started more than 1 year ago. Pertinent negatives include no chest pain, headaches, palpitations or shortness of breath. Risk factors for coronary artery disease include diabetes mellitus, dyslipidemia, obesity, post-menopausal state, smoking/tobacco exposure, sedentary lifestyle and family history. Past treatments include ACE inhibitors.    Review of systems  Constitutional: + Minimally fluctuating body weight,  Cohen  Body mass index is 42.29 kg/m. , no fatigue, no subjective hyperthermia, no subjective hypothermia    Objective:    BP 124/64   Pulse 80   Ht 5' 1 (1.549 m)   Wt 223 lb 12.8 oz (101.5 kg)   LMP 02/25/2019   BMI 42.29 kg/m   Wt Readings from Last 3 Encounters:  07/14/23 223 lb 12.8 oz (101.5 kg)  07/02/23 222 lb 12.8 oz (101.1 kg)  06/18/23 223 lb 8.7 oz (101.4 kg)    Physical Exam- Limited  Constitutional:  Body mass index is 42.29 kg/m. , not in acute distress, normal state of mind    CMP     Component Value Date/Time   NA 135 06/19/2023 0411   NA 144 02/23/2023 1603   K 3.5 06/19/2023 0411   CL 99 06/19/2023 0411   CO2 27 06/19/2023 0411   GLUCOSE 128 (H) 06/19/2023 0411   BUN 22 06/19/2023 0411   BUN 16 02/23/2023 1603   CREATININE 0.87 06/19/2023 0411   CALCIUM  9.1 06/19/2023 0411   PROT 6.9 06/19/2023 0411   PROT 6.7  02/23/2023 1603   ALBUMIN 3.4 (L)  06/19/2023 0411   ALBUMIN 4.2 02/23/2023 1603   AST 22 06/19/2023 0411   ALT 22 06/19/2023 0411   ALKPHOS 49 06/19/2023 0411   BILITOT 0.6 06/19/2023 0411   BILITOT <0.2 02/23/2023 1603   GFRNONAA >60 06/19/2023 0411   GFRAA >60 04/21/2019 0854     Diabetic Labs (most recent): Lab Results  Component Value Date   HGBA1C 8.5 (H) 06/17/2023   HGBA1C 8.3 (A) 03/02/2023   HGBA1C 10.2 (A) 05/19/2022   MICROALBUR 122.3 (H) 04/21/2019     Lipid Panel ( most recent) Lipid Panel     Component Value Date/Time   CHOL 116 05/05/2022 1101   TRIG 76 05/05/2022 1101   HDL 45 05/05/2022 1101   CHOLHDL 2.6 05/05/2022 1101   CHOLHDL 3.5 04/21/2019 0855   VLDL 28 04/21/2019 0855   LDLCALC 56 05/05/2022 1101   LABVLDL 15 05/05/2022 1101      Lab Results  Component Value Date   TSH 2.110 05/05/2022   TSH 1.210 11/07/2020   TSH 2.110 01/16/2020   TSH 3.399 03/07/2013   FREET4 1.01 05/05/2022   FREET4 1.13 11/07/2020   FREET4 1.06 01/16/2020     Assessment & Plan:   1. Uncontrolled type 2 diabetes mellitus with hyperglycemia (HCC)  - [name] has currently uncontrolled symptomatic type 2 DM since 63 years of age.  She presents with slight increase in her A1c to 8.5% from 8.3%.  However, there is an overall improvement from 12.2%.  Her CGM reveals 53% time range, 32% level 1 hyperglycemia, 50% level 2 hyperglycemia.  Her average blood glucose is 184 mg per DL.  She did not document any hypoglycemia.  - I had a long discussion with her about the progressive nature of diabetes and the pathology behind its complications. -her diabetes is complicated by obesity, non- engagement for management plan,  history of smoking and she remains at a high risk for more acute and chronic complications which include CAD, CVA, CKD, retinopathy, and neuropathy. These are all discussed in detail with her.  - I have counseled her on diet  and weight management  by  adopting a carbohydrate restricted/protein rich diet. Patient is encouraged to switch to  unprocessed or minimally processed     complex starch and increased protein intake (animal or plant source), fruits, and vegetables. -  she is advised to stick to a routine mealtimes to eat 3 meals  a day and avoid unnecessary snacks ( to snack only to correct hypoglycemia).  - she acknowledges that there is a room for improvement in her food and drink choices. - Suggestion is made for her to avoid simple carbohydrates  from her diet including Cakes, Sweet Desserts, Ice Cream, Soda (diet and regular), Sweet Tea, Candies, Chips, Cookies, Store Bought Juices, Alcohol , Artificial Sweeteners,  Coffee Creamer, and Sugar-free Products, Lemonade. This will help patient to have more stable blood glucose profile and potentially avoid unintended weight gain.  The following Lifestyle Medicine recommendations according to American College of Lifestyle Medicine  [facility]) were discussed and and offered to patient and she  agrees to start the journey:  A. Whole Foods, Plant-Based Nutrition comprising of fruits and vegetables, plant-based proteins, whole-grain carbohydrates was discussed in detail with the patient.   A list for source of those nutrients were also provided to the patient.  Patient will use only water  or unsweetened tea for hydration. B.  The need to stay away from risky substances including alcohol,  smoking; obtaining 7 to 9 hours of restorative sleep, at least 150 minutes of moderate intensity exercise weekly, the importance of healthy social connections,  and stress management techniques were discussed. C.  A full color page of  Calorie density of various food groups per pound showing examples of each food groups was provided to the patient.   - she has been scheduled with Penny Crumpton, RDN, CDE for diabetes education.  - I have approached her with the following individualized plan to manage  her diabetes and  patient agrees:    -[name] has engaged better.  She is utilizing her CGM optimally. -She is advised to increase her NovoLog  70/30 to 70 units with breakfast and 70 units with supper for Premeal blood glucose readings above 90 mg per DL.  She is encouraged to continue to use her CGM continuously.    -She is advised to continue metformin  500 mg p.o. twice daily.  She has tolerated Jardiance  after 1 episode of yeast infection which required treatment with Diflucan .  She would like to continue, advised to continue Jardiance  10 mg p.o. daily at breakfast.   Side effects and precautions discussed with her.   - Specific targets for  A1c;  LDL, HDL,  and Triglycerides were discussed with the patient.  2) Blood Pressure /Hypertension:   -Her blood pressure is controlled to target.  She has amlodipine  5 mg p.o. daily, Hyzaar, losartan /HCTZ.   The above described WF PB diet will help with hypertension and hyperlipidemia as well.  The most likely reason for uncontrolled hypertension is her nonadherence to medications as well as lifestyle recommendations.   3) Lipids/Hyperlipidemia:   Review of her recent lipid panel showed  controlled  LDL at 56.  She is advised to continue Crestor  20 mg p.o. daily at bedtime.      Side effects and precautions discussed with her.    4)  Weight/Diet: Her BMI is 42.29--   clearly complicating her diabetes care.   she is  a candidate for modest weight loss. I discussed with her the fact that loss of 5 - 10% of her  Cohen body weight will have the most impact on her diabetes management.  Exercise, and detailed carbohydrates information provided  -  detailed on discharge instructions.  5) Chronic Care/Health Maintenance:  -she  is on ACEI/ARB and Statin medications and  is encouraged to initiate and continue to follow up with Ophthalmology, Dentist,  Podiatrist at least yearly or according to recommendations, and advised to  stay away from smoking. I have recommended  yearly flu vaccine and pneumonia vaccine at least every 5 years; moderate intensity exercise for up to 150 minutes weekly; and  sleep for at least 7 hours a day.  Her ABI was recently normal on April 26, 2020.  - she is  advised to maintain close follow up with Fanta, Tesfaye Demissie, MD for primary care needs, as well as her other providers for optimal and coordinated care.   I spent  40  minutes in the care of the patient today including review of labs from CMP, Lipids, Thyroid  Function, Hematology (Cohen and previous including abstractions from other facilities); face-to-face time discussing  her blood glucose readings/logs, discussing hypoglycemia and hyperglycemia episodes and symptoms, medications doses, her options of short and long term treatment based on the latest standards of care / guidelines;  discussion about incorporating lifestyle medicine;  and documenting the encounter. Risk reduction counseling performed per USPSTF guidelines to reduce  obesity and cardiovascular risk factors.     Please refer to Patient Instructions for Blood Glucose Monitoring and Insulin /Medications Dosing Guide  in media tab for additional information. Please  also refer to  Patient Self Inventory in the Media  tab for reviewed elements of pertinent patient history.  [name] participated in the discussions, expressed understanding, and voiced agreement with the above plans.  All questions were answered to her satisfaction. she is encouraged to contact clinic should she have any questions or concerns prior to her return visit.     Follow up plan: - Return in about 4 months (around 11/11/2023) for Bring Meter/CGM Device/Logs- A1c in Office.  Ranny Earl, MD [facility] [address] [place], [place] [address] Phone: [phone]  Fax: [phone]    07/14/2023, 5:49 PM  This note was partially dictated with voice recognition software.  Similar sounding words can be transcribed inadequately or may not  be corrected upon review.

## 2023-07-14 NOTE — Patient Instructions (Signed)

## 2023-07-29 DIAGNOSIS — H40023 Open angle with borderline findings, high risk, bilateral: Secondary | ICD-10-CM | POA: Diagnosis not present

## 2023-07-29 DIAGNOSIS — H25813 Combined forms of age-related cataract, bilateral: Secondary | ICD-10-CM | POA: Diagnosis not present

## 2023-07-29 DIAGNOSIS — E119 Type 2 diabetes mellitus without complications: Secondary | ICD-10-CM | POA: Diagnosis not present

## 2023-07-29 DIAGNOSIS — H524 Presbyopia: Secondary | ICD-10-CM | POA: Diagnosis not present

## 2023-07-29 DIAGNOSIS — G932 Benign intracranial hypertension: Secondary | ICD-10-CM | POA: Diagnosis not present

## 2023-07-29 DIAGNOSIS — H52223 Regular astigmatism, bilateral: Secondary | ICD-10-CM | POA: Diagnosis not present

## 2023-08-03 ENCOUNTER — Encounter (INDEPENDENT_AMBULATORY_CARE_PROVIDER_SITE_OTHER): Payer: Self-pay | Admitting: *Deleted

## 2023-08-10 DIAGNOSIS — R9402 Abnormal brain scan: Secondary | ICD-10-CM | POA: Diagnosis not present

## 2023-08-10 DIAGNOSIS — G9600 Cerebrospinal fluid leak, unspecified: Secondary | ICD-10-CM | POA: Diagnosis not present

## 2023-08-27 DIAGNOSIS — J342 Deviated nasal septum: Secondary | ICD-10-CM | POA: Diagnosis not present

## 2023-08-27 DIAGNOSIS — G9601 Cranial cerebrospinal fluid leak, spontaneous: Secondary | ICD-10-CM | POA: Diagnosis not present

## 2023-08-27 DIAGNOSIS — J3489 Other specified disorders of nose and nasal sinuses: Secondary | ICD-10-CM | POA: Diagnosis not present

## 2023-09-01 ENCOUNTER — Other Ambulatory Visit: Payer: Self-pay | Admitting: "Endocrinology

## 2023-09-01 ENCOUNTER — Telehealth: Payer: Self-pay

## 2023-09-01 ENCOUNTER — Other Ambulatory Visit: Payer: Self-pay | Admitting: Obstetrics & Gynecology

## 2023-09-01 MED ORDER — FLUCONAZOLE 100 MG PO TABS
100.0000 mg | ORAL_TABLET | Freq: Once | ORAL | 2 refills | Status: DC
Start: 2023-09-01 — End: 2023-09-02

## 2023-09-01 NOTE — Telephone Encounter (Signed)
 Pt called stating she's experiencing reoccurring symptoms of an yeast infection.

## 2023-09-02 NOTE — Telephone Encounter (Signed)
 Noted.

## 2023-09-07 DIAGNOSIS — Q010 Frontal encephalocele: Secondary | ICD-10-CM | POA: Diagnosis not present

## 2023-09-07 DIAGNOSIS — G9601 Cranial cerebrospinal fluid leak, spontaneous: Secondary | ICD-10-CM | POA: Diagnosis not present

## 2023-09-07 DIAGNOSIS — G9600 Cerebrospinal fluid leak, unspecified: Secondary | ICD-10-CM | POA: Diagnosis not present

## 2023-09-07 DIAGNOSIS — G932 Benign intracranial hypertension: Secondary | ICD-10-CM | POA: Diagnosis not present

## 2023-09-17 DIAGNOSIS — I10 Essential (primary) hypertension: Secondary | ICD-10-CM | POA: Diagnosis not present

## 2023-09-17 DIAGNOSIS — E1165 Type 2 diabetes mellitus with hyperglycemia: Secondary | ICD-10-CM | POA: Diagnosis not present

## 2023-09-17 DIAGNOSIS — J4530 Mild persistent asthma, uncomplicated: Secondary | ICD-10-CM | POA: Diagnosis not present

## 2023-09-28 ENCOUNTER — Other Ambulatory Visit: Payer: Self-pay | Admitting: Obstetrics & Gynecology

## 2023-10-03 ENCOUNTER — Other Ambulatory Visit: Payer: Self-pay | Admitting: "Endocrinology

## 2023-10-15 ENCOUNTER — Other Ambulatory Visit: Payer: Self-pay | Admitting: "Endocrinology

## 2023-10-15 DIAGNOSIS — E1165 Type 2 diabetes mellitus with hyperglycemia: Secondary | ICD-10-CM

## 2023-10-19 ENCOUNTER — Other Ambulatory Visit: Payer: Self-pay | Admitting: "Endocrinology

## 2023-10-19 DIAGNOSIS — E1165 Type 2 diabetes mellitus with hyperglycemia: Secondary | ICD-10-CM

## 2023-10-28 DIAGNOSIS — H40023 Open angle with borderline findings, high risk, bilateral: Secondary | ICD-10-CM | POA: Diagnosis not present

## 2023-11-07 ENCOUNTER — Other Ambulatory Visit: Payer: Self-pay | Admitting: Obstetrics & Gynecology

## 2023-11-11 ENCOUNTER — Encounter: Payer: Self-pay | Admitting: "Endocrinology

## 2023-11-11 ENCOUNTER — Ambulatory Visit (INDEPENDENT_AMBULATORY_CARE_PROVIDER_SITE_OTHER): Payer: BC Managed Care – PPO | Admitting: "Endocrinology

## 2023-11-11 VITALS — BP 126/62 | HR 84 | Ht 61.0 in | Wt 225.0 lb

## 2023-11-11 DIAGNOSIS — E1165 Type 2 diabetes mellitus with hyperglycemia: Secondary | ICD-10-CM | POA: Diagnosis not present

## 2023-11-11 DIAGNOSIS — I10 Essential (primary) hypertension: Secondary | ICD-10-CM | POA: Diagnosis not present

## 2023-11-11 DIAGNOSIS — Z794 Long term (current) use of insulin: Secondary | ICD-10-CM | POA: Diagnosis not present

## 2023-11-11 DIAGNOSIS — E782 Mixed hyperlipidemia: Secondary | ICD-10-CM | POA: Diagnosis not present

## 2023-11-11 LAB — POCT GLYCOSYLATED HEMOGLOBIN (HGB A1C): HbA1c, POC (controlled diabetic range): 7.8 % — AB (ref 0.0–7.0)

## 2023-11-11 MED ORDER — EMPAGLIFLOZIN 10 MG PO TABS
10.0000 mg | ORAL_TABLET | Freq: Every day | ORAL | 1 refills | Status: DC
Start: 2023-11-11 — End: 2024-03-15

## 2023-11-11 NOTE — Progress Notes (Signed)
 11/11/2023, 4:41 PM   Endocrinology follow-up note   Subjective:    Patient ID: [name], female    DOB: 26-Dec-1960.  [name] is being seen in follow-up in the management of her currently uncontrolled type 2 diabetes, hypertension. WGN:FAOZH, Nancee Awe, MD.   Past Medical History:  Diagnosis Date   Allergy    Asthma    COPD (chronic obstructive pulmonary disease) (HCC)    Diabetes mellitus without complication (HCC)    Hypertension    IIH (idiopathic intracranial hypertension)    Morbid obesity due to excess calories (HCC)    Umbilical hernia     Past Surgical History:  Procedure Laterality Date   CARPAL TUNNEL RELEASE Right    CHOLECYSTECTOMY     COLONOSCOPY N/A 01/12/2015   Procedure: COLONOSCOPY;  Surgeon: Alyce Jubilee, MD;  Location: [facility];  Service: Endoscopy;  Laterality: N/A;  0830   HYSTEROSCOPY WITH D & C N/A 04/01/2013   Procedure: DILATATION AND CURETTAGE /HYSTEROSCOPY;  Surgeon: Wendelyn Halter, MD;  Location: [facility];  Service: Gynecology;  Laterality: N/A;   NASAL POLYP SURGERY      Social History   Socioeconomic History   Marital status: Married    Spouse name: Not on file   Number of children: 2   Years of education: Not on file   Highest education level: Not on file  Occupational History   Not on file  Tobacco Use   Smoking status: Former    Current packs/day: 0.00    Average packs/day: 0.5 packs/day for 20.0 years (10.0 ttl pk-yrs)    Types: Cigarettes    Start date: 03/25/1977    Quit date: 03/25/1997    Years since quitting: 26.6   Smokeless tobacco: Never  Vaping Use   Vaping status: Never Used  Substance and Sexual Activity   Alcohol use: Yes    Alcohol/week: 0.0 standard drinks of alcohol    Comment: Ocassional   Drug use: No   Sexual activity: Yes    Birth control/protection: None, Post-menopausal  Other Topics Concern   Not on file   Social History Narrative   Not on file   Social Drivers of Health   Financial Resource Strain: Low Risk  (07/02/2023)   Overall Financial Resource Strain (CARDIA)    Difficulty of Paying Living Expenses: Not hard at all  Food Insecurity: No Food Insecurity (07/02/2023)   Hunger Vital Sign    Worried About Running Out of Food in the Last Year: Never true    Ran Out of Food in the Last Year: Never true  Transportation Needs: No Transportation Needs (07/02/2023)   PRAPARE - Administrator, Civil Service (Medical): No    Lack of Transportation (Non-Medical): No  Physical Activity: Inactive (07/02/2023)   Exercise Vital Sign    Days of Exercise per Week: 0 days    Minutes of Exercise per Session: 0 min  Stress: No Stress Concern Present (07/02/2023)   [employer] of Occupational Health - Occupational Stress Questionnaire    Feeling of Stress : Not at all  Social Connections: Socially Integrated (07/02/2023)   Social Connection and  Isolation Panel [NHANES]    Frequency of Communication with Friends and Family: More than three times a week    Frequency of Social Gatherings with Friends and Family: Once a week    Attends Religious Services: More than 4 times per year    Active Member of [employer] or Organizations: Yes    Attends Engineer, structural: More than 4 times per year    Marital Status: Married    Family History  Problem Relation Age of Onset   Diabetes Mother    Kidney disease Mother    Other Mother        renal failure   Hypertension Mother    Cancer Mother        ?uterine cancer    Diabetes Father    Cancer Father        pancreatic   Diabetes Sister    Kidney disease Sister    Diabetes Maternal Grandmother    Asthma Maternal Grandmother    COPD Maternal Grandmother        bronchitis   Hypertension Maternal Grandmother    Thyroid  disease Maternal Grandmother    Hypertension Daughter    Colon cancer Neg Hx     Outpatient Encounter  Medications as of 11/11/2023  Medication Sig   acetaZOLAMIDE (DIAMOX) 250 MG tablet Take 250 mg by mouth 3 (three) times daily.   aspirin  81 MG EC tablet Take 1 tablet by mouth daily.   atenolol  (TENORMIN ) 25 MG tablet Take 25 mg by mouth daily.   B-D UF III MINI PEN NEEDLES 31G X 5 MM MISC USE ONCE DAILY AS DIRECTED   Continuous Blood Gluc Sensor (DEXCOM G7 SENSOR) MISC Change sensor every 10 days. Use to check glucose as directed.   Continuous Glucose Sensor (DEXCOM G7 SENSOR) MISC CHANGE THE SENSOR EVERY 10 DAYS   diclofenac sodium (VOLTAREN) 1 % GEL Apply 2 g topically 4 (four) times daily as needed (pain).   empagliflozin  (JARDIANCE ) 10 MG TABS tablet Take 1 tablet (10 mg total) by mouth daily before breakfast.   fluconazole  (DIFLUCAN ) 100 MG tablet TAKE 1 TABLET BY MOUTH EVERY DAY   fluticasone  (FLONASE ) 50 MCG/ACT nasal spray Place 2 sprays into both nostrils daily.   Fluticasone -Salmeterol (ADVAIR) 250-50 MCG/DOSE AEPB Inhale 1 puff into the lungs 2 (two) times daily.    insulin  aspart protamine - aspart (NOVOLOG  MIX 70/30 FLEXPEN) (70-30) 100 UNIT/ML FlexPen INJECT 70 UNITS AT BREAKFAST AND 70 UNITS AT SUPPER DAILY AS DIRECTED   ipratropium-albuterol  (DUONEB) 0.5-2.5 (3) MG/3ML SOLN Take 3 mLs by nebulization every 4 (four) hours as needed (Shortness of Breath).    losartan -hydrochlorothiazide  (HYZAAR) 100-25 MG tablet Take 1 tablet by mouth daily.   metFORMIN  (GLUCOPHAGE ) 500 MG tablet TAKE 1 TABLET BY MOUTH TWICE A DAY WITH FOOD   Multiple Vitamin (MULTIVITAMIN WITH MINERALS) TABS tablet Take 1 tablet by mouth daily.   rosuvastatin  (CRESTOR ) 20 MG tablet Take 0.5 tablets (10 mg total) by mouth daily.   [DISCONTINUED] empagliflozin  (JARDIANCE ) 10 MG TABS tablet Take 1 tablet (10 mg total) by mouth daily before breakfast.   No facility-administered encounter medications on file as of 11/11/2023.    ALLERGIES: No Known Allergies  VACCINATION STATUS: Immunization History  Administered  Date(s) Administered   Influenza-Unspecified 06/09/2010, 06/09/2013   Pneumococcal Polysaccharide-23 08/05/2021    Diabetes She presents for her follow-up diabetic visit. She has type 2 diabetes mellitus. Onset time: She was diagnosed at approximate age of 40 years. Her  disease course has been improving. There are no hypoglycemic associated symptoms. Pertinent negatives for hypoglycemia include no confusion, headaches, pallor or seizures. Pertinent negatives for diabetes include no chest pain, no fatigue, no polydipsia, no polyphagia and no polyuria. There are no hypoglycemic complications. Symptoms are improving. There are no diabetic complications. Risk factors for coronary artery disease include diabetes mellitus, dyslipidemia, family history, obesity, tobacco exposure, sedentary lifestyle, post-menopausal and hypertension. Current diabetic treatment includes insulin  injections. Her weight is fluctuating minimally. She is following a generally unhealthy diet. When asked about meal planning, she reported none. She has not had a previous visit with a dietitian. She rarely participates in exercise. Her home blood glucose trend is decreasing steadily. Her breakfast blood glucose range is generally 140-180 mg/dl. Her lunch blood glucose range is generally 140-180 mg/dl. Her dinner blood glucose range is generally 140-180 mg/dl. Her bedtime blood glucose range is generally 140-180 mg/dl. Her overall blood glucose range is 140-180 mg/dl. (She presents with improved glycemic profile with point-of-care A1c of 7.8% overall improving from 12.2%.  Her Dexcom CGM shows 69% time range, 19% level 1 hyperglycemia, 12% level 2 hyperglycemia.    She did not document any hypoglycemia.   ) An ACE inhibitor/angiotensin II receptor blocker is being taken.  Hyperlipidemia This is a chronic problem. The current episode started more than 1 year ago. The problem is controlled. Exacerbating diseases include diabetes and  obesity. Pertinent negatives include no chest pain, myalgias or shortness of breath. Current antihyperlipidemic treatment includes statins. Risk factors for coronary artery disease include diabetes mellitus, dyslipidemia, family history, hypertension, obesity, a sedentary lifestyle and post-menopausal.  Hypertension This is a chronic problem. The current episode started more than 1 year ago. Pertinent negatives include no chest pain, headaches, palpitations or shortness of breath. Risk factors for coronary artery disease include diabetes mellitus, dyslipidemia, obesity, post-menopausal state, smoking/tobacco exposure, sedentary lifestyle and family history. Past treatments include ACE inhibitors.    Review of systems  Constitutional: + Minimally fluctuating body weight,  current  Body mass index is 42.51 kg/m. , no fatigue, no subjective hyperthermia, no subjective hypothermia    Objective:    BP 126/62   Pulse 84   Ht 5\' 1"  (1.549 m)   Wt 225 lb (102.1 kg)   LMP 02/25/2019   BMI 42.51 kg/m   Wt Readings from Last 3 Encounters:  11/11/23 225 lb (102.1 kg)  07/14/23 223 lb 12.8 oz (101.5 kg)  07/02/23 222 lb 12.8 oz (101.1 kg)    Physical Exam- Limited  Constitutional:  Body mass index is 42.51 kg/m. , not in acute distress, normal state of mind    CMP     Component Value Date/Time   NA 135 06/19/2023 0411   NA 144 02/23/2023 1603   K 3.5 06/19/2023 0411   CL 99 06/19/2023 0411   CO2 27 06/19/2023 0411   GLUCOSE 128 (H) 06/19/2023 0411   BUN 22 06/19/2023 0411   BUN 16 02/23/2023 1603   CREATININE 0.87 06/19/2023 0411   CALCIUM  9.1 06/19/2023 0411   PROT 6.9 06/19/2023 0411   PROT 6.7 02/23/2023 1603   ALBUMIN 3.4 (L) 06/19/2023 0411   ALBUMIN 4.2 02/23/2023 1603   AST 22 06/19/2023 0411   ALT 22 06/19/2023 0411   ALKPHOS 49 06/19/2023 0411   BILITOT 0.6 06/19/2023 0411   BILITOT <0.2 02/23/2023 1603   GFRNONAA >60 06/19/2023 0411   GFRAA >60 04/21/2019 0854      Diabetic Labs (most  recent): Lab Results  Component Value Date   HGBA1C 7.8 (A) 11/11/2023   HGBA1C 8.5 (H) 06/17/2023   HGBA1C 8.3 (A) 03/02/2023   MICROALBUR 122.3 (H) 04/21/2019     Lipid Panel ( most recent) Lipid Panel     Component Value Date/Time   CHOL 116 05/05/2022 1101   TRIG 76 05/05/2022 1101   HDL 45 05/05/2022 1101   CHOLHDL 2.6 05/05/2022 1101   CHOLHDL 3.5 04/21/2019 0855   VLDL 28 04/21/2019 0855   LDLCALC 56 05/05/2022 1101   LABVLDL 15 05/05/2022 1101      Lab Results  Component Value Date   TSH 2.110 05/05/2022   TSH 1.210 11/07/2020   TSH 2.110 01/16/2020   TSH 3.399 03/07/2013   FREET4 1.01 05/05/2022   FREET4 1.13 11/07/2020   FREET4 1.06 01/16/2020     Assessment & Plan:   1. Uncontrolled type 2 diabetes mellitus with hyperglycemia (HCC)  - [name] has currently uncontrolled symptomatic type 2 DM since 63 years of age.  She presents with improved glycemic profile with point-of-care A1c of 7.8% overall improving from 12.2%.  Her Dexcom CGM shows 69% time range, 19% level 1 hyperglycemia, 12% level 2 hyperglycemia.    She did not document any hypoglycemia.  - I had a long discussion with her about the progressive nature of diabetes and the pathology behind its complications. -her diabetes is complicated by obesity, non- engagement for management plan,  history of smoking and she remains at a high risk for more acute and chronic complications which include CAD, CVA, CKD, retinopathy, and neuropathy. These are all discussed in detail with her.  - I have counseled her on diet  and weight management  by adopting a carbohydrate restricted/protein rich diet. Patient is encouraged to switch to  unprocessed or minimally processed     complex starch and increased protein intake (animal or plant source), fruits, and vegetables. -  she is advised to stick to a routine mealtimes to eat 3 meals  a day and avoid unnecessary snacks ( to snack  only to correct hypoglycemia).   - she acknowledges that there is a room for improvement in her food and drink choices. - Suggestion is made for her to avoid simple carbohydrates  from her diet including Cakes, Sweet Desserts, Ice Cream, Soda (diet and regular), Sweet Tea, Candies, Chips, Cookies, Store Bought Juices, Alcohol , Artificial Sweeteners,  Coffee Creamer, and "Sugar-free" Products, Lemonade. This will help patient to have more stable blood glucose profile and potentially avoid unintended weight gain.  The following Lifestyle Medicine recommendations according to American College of Lifestyle Medicine  [facility]) were discussed and and offered to patient and she  agrees to start the journey:  A. Whole Foods, Plant-Based Nutrition comprising of fruits and vegetables, plant-based proteins, whole-grain carbohydrates was discussed in detail with the patient.   A list for source of those nutrients were also provided to the patient.  Patient will use only water  or unsweetened tea for hydration. B.  The need to stay away from risky substances including alcohol, smoking; obtaining 7 to 9 hours of restorative sleep, at least 150 minutes of moderate intensity exercise weekly, the importance of healthy social connections,  and stress management techniques were discussed. C.  A full color page of  Calorie density of various food groups per pound showing examples of each food groups was provided to the patient.   - she has been scheduled with Penny Crumpton, RDN,  CDE for diabetes education.  - I have approached her with the following individualized plan to manage  her diabetes and patient agrees:    -[name] continued to be engaged in her care.  She is utilizing her CGM optimally.  She is advised to continue NovoLog  70/30 70 units with breakfast and 70 with supper for Premeal blood glucose readings above 90 mg per DL.  She is encouraged to continue to use her CGM continuously.    -She is advised to  continue metformin  500 mg p.o. twice daily.  She continues to tolerate Jardiance , advised to continue Jardiance  10 mg p.o. daily at breakfast.    Side effects and precautions discussed with her.   - Specific targets for  A1c;  LDL, HDL,  and Triglycerides were discussed with the patient.  2) Blood Pressure /Hypertension:   -Her blood pressure is controlled to target.  She has amlodipine  5 mg p.o. daily, Hyzaar, losartan /HCTZ.   The above described WF PB diet will help with hypertension and hyperlipidemia as well.    3) Lipids/Hyperlipidemia:   Review of her recent lipid panel showed  controlled  LDL at 56.  She is advised to continue Crestor  20 mg p.o. daily at bedtime.    Side effects and precautions discussed with her.    4)  Weight/Diet: Her BMI is 42.51-  clearly complicating her diabetes care.   she is  a candidate for modest weight loss. I discussed with her the fact that loss of 5 - 10% of her  current body weight will have the most impact on her diabetes management.  Exercise, and detailed carbohydrates information provided  -  detailed on discharge instructions.  5) Chronic Care/Health Maintenance:  -she  is on ACEI/ARB and Statin medications and  is encouraged to initiate and continue to follow up with Ophthalmology, Dentist,  Podiatrist at least yearly or according to recommendations, and advised to  stay away from smoking. I have recommended yearly flu vaccine and pneumonia vaccine at least every 5 years; moderate intensity exercise for up to 150 minutes weekly; and  sleep for at least 7 hours a day.  Her ABI was recently normal on April 26, 2020.  - she is  advised to maintain close follow up with Fanta, Tesfaye Demissie, MD for primary care needs, as well as her other providers for optimal and coordinated care.   I spent  43  minutes in the care of the patient today including review of labs from CMP, Lipids, Thyroid  Function, Hematology (current and previous including  abstractions from other facilities); face-to-face time discussing  her blood glucose readings/logs, discussing hypoglycemia and hyperglycemia episodes and symptoms, medications doses, her options of short and long term treatment based on the latest standards of care / guidelines;  discussion about incorporating lifestyle medicine;  and documenting the encounter. Risk reduction counseling performed per USPSTF guidelines to reduce  obesity and cardiovascular risk factors.     Please refer to Patient Instructions for Blood Glucose Monitoring and Insulin /Medications Dosing Guide"  in media tab for additional information. Please  also refer to " Patient Self Inventory" in the Media  tab for reviewed elements of pertinent patient history.  [name] participated in the discussions, expressed understanding, and voiced agreement with the above plans.  All questions were answered to her satisfaction. she is encouraged to contact clinic should she have any questions or concerns prior to her return visit.    Follow up plan: - Return in  about 4 months (around 03/12/2024) for F/U with Pre-visit Labs, Meter/CGM/Logs, A1c here.  Kalvin Orf, MD [facility] [address] [place], [place] [address] Phone: [phone]  Fax: [phone]    11/11/2023, 4:41 PM  This note was partially dictated with voice recognition software. Similar sounding words can be transcribed inadequately or may not  be corrected upon review.

## 2023-11-11 NOTE — Patient Instructions (Signed)

## 2023-11-23 DIAGNOSIS — J449 Chronic obstructive pulmonary disease, unspecified: Secondary | ICD-10-CM | POA: Diagnosis not present

## 2023-11-23 DIAGNOSIS — Z01818 Encounter for other preprocedural examination: Secondary | ICD-10-CM | POA: Diagnosis not present

## 2023-11-23 DIAGNOSIS — I152 Hypertension secondary to endocrine disorders: Secondary | ICD-10-CM | POA: Diagnosis not present

## 2023-11-23 DIAGNOSIS — E119 Type 2 diabetes mellitus without complications: Secondary | ICD-10-CM | POA: Diagnosis not present

## 2023-11-23 DIAGNOSIS — Z7984 Long term (current) use of oral hypoglycemic drugs: Secondary | ICD-10-CM | POA: Diagnosis not present

## 2023-11-23 DIAGNOSIS — G9600 Cerebrospinal fluid leak, unspecified: Secondary | ICD-10-CM | POA: Diagnosis not present

## 2023-12-02 DIAGNOSIS — E785 Hyperlipidemia, unspecified: Secondary | ICD-10-CM | POA: Diagnosis not present

## 2023-12-02 DIAGNOSIS — R918 Other nonspecific abnormal finding of lung field: Secondary | ICD-10-CM | POA: Diagnosis not present

## 2023-12-02 DIAGNOSIS — Z9049 Acquired absence of other specified parts of digestive tract: Secondary | ICD-10-CM | POA: Diagnosis not present

## 2023-12-02 DIAGNOSIS — J3489 Other specified disorders of nose and nasal sinuses: Secondary | ICD-10-CM | POA: Diagnosis not present

## 2023-12-02 DIAGNOSIS — J342 Deviated nasal septum: Secondary | ICD-10-CM | POA: Diagnosis not present

## 2023-12-02 DIAGNOSIS — J322 Chronic ethmoidal sinusitis: Secondary | ICD-10-CM | POA: Diagnosis not present

## 2023-12-02 DIAGNOSIS — I10 Essential (primary) hypertension: Secondary | ICD-10-CM | POA: Diagnosis not present

## 2023-12-02 DIAGNOSIS — Z7984 Long term (current) use of oral hypoglycemic drugs: Secondary | ICD-10-CM | POA: Diagnosis not present

## 2023-12-02 DIAGNOSIS — G9601 Cranial cerebrospinal fluid leak, spontaneous: Secondary | ICD-10-CM | POA: Diagnosis not present

## 2023-12-02 DIAGNOSIS — J4489 Other specified chronic obstructive pulmonary disease: Secondary | ICD-10-CM | POA: Diagnosis not present

## 2023-12-02 DIAGNOSIS — E119 Type 2 diabetes mellitus without complications: Secondary | ICD-10-CM | POA: Diagnosis not present

## 2023-12-02 DIAGNOSIS — Q011 Nasofrontal encephalocele: Secondary | ICD-10-CM | POA: Diagnosis not present

## 2023-12-02 DIAGNOSIS — G9600 Cerebrospinal fluid leak, unspecified: Secondary | ICD-10-CM | POA: Diagnosis not present

## 2023-12-02 DIAGNOSIS — G9608 Other cranial cerebrospinal fluid leak: Secondary | ICD-10-CM | POA: Diagnosis not present

## 2023-12-02 DIAGNOSIS — Q010 Frontal encephalocele: Secondary | ICD-10-CM | POA: Diagnosis not present

## 2023-12-07 ENCOUNTER — Telehealth: Payer: Self-pay

## 2023-12-07 NOTE — Transitions of Care (Post Inpatient/ED Visit) (Signed)
   12/07/2023  Name: [name] MRN: [id number] DOB: Nov 15, 1960  Today's TOC FU Call Status: Today's TOC FU Call Status:: Unsuccessful Call (1st Attempt) Unsuccessful Call (1st Attempt) Date: 12/07/23  Attempted to reach the patient regarding the most recent Inpatient/ED visit.  Follow Up Plan: Additional outreach attempts will be made to reach the patient to complete the Transitions of Care (Post Inpatient/ED visit) call.   Acelynn Dejonge J. Leigha Olberding RN, MSN [facility], [facility] RN Care Manager Direct Dial : [phone]  Fax: [phone] Website: [email or web address]

## 2023-12-08 ENCOUNTER — Telehealth: Payer: Self-pay

## 2023-12-08 NOTE — Patient Instructions (Signed)
 Visit Information  Thank you for taking time to visit with me today. Please don't hesitate to contact me if I can be of assistance to you.  Call your doctor or go to the emergency room if you have:  - Fever of 101.5 degrees or higher - Severe headache or neck pain that has increased greatly since your surgery or is uncontrolled by your current pain medications - Chest pain/shortness of breath - Any other acute events, problems, or concerns  Patient verbalizes understanding of instructions and care plan provided today and agrees to view in MyChart. Active MyChart status and patient understanding of how to access instructions and care plan via MyChart confirmed with patient.     The patient has been provided with contact information for the care management team and has been advised to call with any health related questions or concerns.   Please call the care guide team at [phone] if you need to cancel or reschedule your appointment.   Please call the Suicide and Crisis Lifeline: 988 if you are experiencing a Mental Health or Behavioral Health Crisis or need someone to talk to.  Lujean Ebright J. Buford Bremer RN, MSN [facility], [facility] RN Care Manager Direct Dial : [phone]  Fax: [phone] Website: [email or web address]

## 2023-12-08 NOTE — Transitions of Care (Post Inpatient/ED Visit) (Signed)
 12/08/2023  Name: Yuritzy Zehring Ortner MRN: [id number] DOB: 05-09-1961  Today's TOC FU Call Status: Today's TOC FU Call Status:: Successful TOC FU Call Completed TOC FU Call Complete Date: 12/08/23 Patient's Name and Date of Birth confirmed.  Transition Care Management Follow-up Telephone Call Date of Discharge: 12/05/23 Discharge Facility: Other [facility]) Name of Other (Non[facility]) Discharge Facility: [facility] Type of Discharge: Inpatient Admission Primary Inpatient Discharge Diagnosis:: CSF Leak How have you been since you were released from the hospital?: Better Any questions or concerns?: No  Items Reviewed: Did you receive and understand the discharge instructions provided?: Yes Medications obtained,verified, and reconciled?: Yes (Medications Reviewed) Any new allergies since your discharge?: No Dietary orders reviewed?: Yes Type of Diet Ordered:: Carb modified Do you have support at home?: Yes People in Home [RPT]: spouse Name of Support/Comfort Primary Source: [place]  Medications Reviewed Today: Medications Reviewed Today     Reviewed by Tomas Schamp, RN (Case Manager) on 12/08/23 at 1052  Med List Status: <None>   Medication Order Taking? Sig Documenting Provider Last Dose Status Informant  acetaZOLAMIDE (DIAMOX) 250 MG tablet [id number] Yes Take 250 mg by mouth 3 (three) times daily. [provider]  Active Self  aspirin  81 MG EC tablet [id number] Yes Take 1 tablet by mouth daily. [provider]  Active Self  atenolol  (TENORMIN ) 25 MG tablet [id number] Yes Take 25 mg by mouth daily. [provider]  Active Self  B-D UF III MINI PEN NEEDLES 31G X 5 MM MISC [id number] Yes USE ONCE DAILY AS DIRECTED Lenis Gebreselassie W, MD  Active Self  Continuous Blood Gluc Sensor (DEXCOM G7 SENSOR) MISC [id number] Yes Change sensor every 10 days. Use to check glucose as directed. Nida, Gebreselassie W, MD  Active Self  Continuous  Glucose Sensor (DEXCOM G7 SENSOR) [place] [id number] Yes CHANGE THE SENSOR EVERY 10 DAYS Nida, Gebreselassie W, MD  Active   diclofenac sodium (VOLTAREN) 1 % GEL [id number]  Apply 2 g topically 4 (four) times daily as needed (pain).  Patient not taking: Reported on 12/08/2023   [provider]  Active Self  empagliflozin  (JARDIANCE ) 10 MG TABS tablet [id number] Yes Take 1 tablet (10 mg total) by mouth daily before breakfast. Nida, Gebreselassie W, MD  Active   fluconazole  (DIFLUCAN ) 100 MG tablet [id number]  TAKE 1 TABLET BY MOUTH EVERY DAY  Patient not taking: Reported on 12/08/2023   Jayne Vonn DEL, MD  Active   fluticasone  (FLONASE ) 50 MCG/ACT nasal spray [id number] Yes Place 2 sprays into both nostrils daily. [provider]  Active Self  Fluticasone -Salmeterol (ADVAIR) 250-50 MCG/DOSE AEPB [id number] Yes Inhale 1 puff into the lungs 2 (two) times daily.  [provider]  Active Self  insulin  aspart protamine - aspart (NOVOLOG  MIX 70/30 FLEXPEN) (70-30) 100 UNIT/ML FlexPen [id number] Yes INJECT 70 UNITS AT BREAKFAST AND 70 UNITS AT SUPPER DAILY AS DIRECTED Nida, Gebreselassie W, MD  Active   ipratropium-albuterol  (DUONEB) 0.5-2.5 (3) MG/3ML SOLN [id number] Yes Take 3 mLs by nebulization every 4 (four) hours as needed (Shortness of Breath).  [provider]  Active Self  losartan -hydrochlorothiazide  (HYZAAR) 100-25 MG tablet [id number] Yes Take 1 tablet by mouth daily. [provider]  Active Self  metFORMIN  (GLUCOPHAGE ) 500 MG tablet [id number] Yes TAKE 1 TABLET BY MOUTH TWICE A DAY WITH FOOD Nida, Gebreselassie W, MD  Active   Multiple Vitamin (MULTIVITAMIN WITH MINERALS) TABS tablet [id number] Yes Take 1 tablet by mouth daily. [provider]  Active Self  rosuvastatin  (CRESTOR ) 20 MG tablet [id number] Yes Take 0.5 tablets (10 mg total) by mouth daily. Vicci Afton CROME, MD  Active Self            Home Care and Equipment/Supplies: Were Home Health  Services Ordered?: NA Any new equipment or medical supplies ordered?: NA  Functional Questionnaire: Do you need assistance with bathing/showering or dressing?: No Do you need assistance with meal preparation?: No Do you need assistance with eating?: No Do you have difficulty maintaining continence: No Do you need assistance with getting out of bed/getting out of a chair/moving?: No Do you have difficulty managing or taking your medications?: No  Follow up appointments reviewed: PCP Follow-up appointment confirmed?: No (patient to call PCP for follow up) MD Provider Line Number:[phone] Given: No Specialist Hospital Follow-up appointment confirmed?: Yes Date of Specialist follow-up appointment?: 12/10/23 Follow-Up Specialty Provider:: ENT Do you need transportation to your follow-up appointment?: No Do you understand care options if your condition(s) worsen?: Yes-patient verbalized understanding    Conrado Nance DOROTHA Seeds RN, MSN [facility]  [facility], [facility] RN Care Manager Direct Dial : [phone]  Fax: [phone] Website: [email or web address]

## 2023-12-17 DIAGNOSIS — Q019 Encephalocele, unspecified: Secondary | ICD-10-CM | POA: Diagnosis not present

## 2023-12-17 DIAGNOSIS — G9601 Cranial cerebrospinal fluid leak, spontaneous: Secondary | ICD-10-CM | POA: Diagnosis not present

## 2023-12-17 DIAGNOSIS — Z9889 Other specified postprocedural states: Secondary | ICD-10-CM | POA: Diagnosis not present

## 2023-12-22 ENCOUNTER — Other Ambulatory Visit: Payer: Self-pay | Admitting: "Endocrinology

## 2023-12-31 ENCOUNTER — Other Ambulatory Visit: Payer: Self-pay | Admitting: "Endocrinology

## 2024-01-06 ENCOUNTER — Other Ambulatory Visit: Payer: Self-pay | Admitting: Obstetrics & Gynecology

## 2024-01-07 DIAGNOSIS — G9601 Cranial cerebrospinal fluid leak, spontaneous: Secondary | ICD-10-CM | POA: Diagnosis not present

## 2024-01-07 DIAGNOSIS — Z9889 Other specified postprocedural states: Secondary | ICD-10-CM | POA: Diagnosis not present

## 2024-01-07 DIAGNOSIS — Q019 Encephalocele, unspecified: Secondary | ICD-10-CM | POA: Diagnosis not present

## 2024-01-16 ENCOUNTER — Other Ambulatory Visit: Payer: Self-pay | Admitting: "Endocrinology

## 2024-01-16 DIAGNOSIS — E1165 Type 2 diabetes mellitus with hyperglycemia: Secondary | ICD-10-CM

## 2024-01-20 DIAGNOSIS — Z0001 Encounter for general adult medical examination with abnormal findings: Secondary | ICD-10-CM | POA: Diagnosis not present

## 2024-01-20 DIAGNOSIS — E1165 Type 2 diabetes mellitus with hyperglycemia: Secondary | ICD-10-CM | POA: Diagnosis not present

## 2024-01-20 DIAGNOSIS — I700 Atherosclerosis of aorta: Secondary | ICD-10-CM | POA: Diagnosis not present

## 2024-01-30 ENCOUNTER — Other Ambulatory Visit: Payer: Self-pay | Admitting: "Endocrinology

## 2024-01-30 DIAGNOSIS — E1165 Type 2 diabetes mellitus with hyperglycemia: Secondary | ICD-10-CM

## 2024-02-09 DIAGNOSIS — Z1212 Encounter for screening for malignant neoplasm of rectum: Secondary | ICD-10-CM | POA: Diagnosis not present

## 2024-02-09 DIAGNOSIS — Z1211 Encounter for screening for malignant neoplasm of colon: Secondary | ICD-10-CM | POA: Diagnosis not present

## 2024-02-09 DIAGNOSIS — I10 Essential (primary) hypertension: Secondary | ICD-10-CM | POA: Diagnosis not present

## 2024-02-09 DIAGNOSIS — E1165 Type 2 diabetes mellitus with hyperglycemia: Secondary | ICD-10-CM | POA: Diagnosis not present

## 2024-02-09 LAB — HEMOGLOBIN A1C: Hemoglobin A1C: 8

## 2024-02-09 LAB — HEPATIC FUNCTION PANEL
ALT: 19 U/L (ref 7–35)
AST: 14 (ref 13–35)
Alkaline Phosphatase: 77 (ref 25–125)
Bilirubin, Direct: 0.08 (ref 0.01–0.4)
Bilirubin, Total: 0.2

## 2024-02-09 LAB — BASIC METABOLIC PANEL WITH GFR
BUN: 20 (ref 4–21)
CO2: 24 — AB (ref 13–22)
Chloride: 105 (ref 99–108)
Creatinine: 0.8 (ref 0.5–1.1)
Glucose: 94
Potassium: 4.6 meq/L (ref 3.5–5.1)
Sodium: 141 (ref 137–147)

## 2024-02-09 LAB — LIPID PANEL
Cholesterol: 140 (ref 0–200)
HDL: 47 (ref 35–70)
LDL Cholesterol: 77
Triglycerides: 83 (ref 40–160)

## 2024-02-09 LAB — COMPREHENSIVE METABOLIC PANEL WITH GFR
Albumin: 4.2 (ref 3.5–5.0)
Calcium: 9.9 (ref 8.7–10.7)

## 2024-02-16 DIAGNOSIS — G9601 Cranial cerebrospinal fluid leak, spontaneous: Secondary | ICD-10-CM | POA: Diagnosis not present

## 2024-02-16 DIAGNOSIS — Q019 Encephalocele, unspecified: Secondary | ICD-10-CM | POA: Diagnosis not present

## 2024-02-16 DIAGNOSIS — Z9889 Other specified postprocedural states: Secondary | ICD-10-CM | POA: Diagnosis not present

## 2024-02-16 DIAGNOSIS — J3489 Other specified disorders of nose and nasal sinuses: Secondary | ICD-10-CM | POA: Diagnosis not present

## 2024-03-02 ENCOUNTER — Other Ambulatory Visit: Payer: Self-pay | Admitting: Obstetrics & Gynecology

## 2024-03-15 ENCOUNTER — Encounter: Payer: Self-pay | Admitting: "Endocrinology

## 2024-03-15 ENCOUNTER — Ambulatory Visit (INDEPENDENT_AMBULATORY_CARE_PROVIDER_SITE_OTHER): Admitting: "Endocrinology

## 2024-03-15 VITALS — BP 136/82 | HR 76 | Ht 61.0 in | Wt 223.8 lb

## 2024-03-15 DIAGNOSIS — E1165 Type 2 diabetes mellitus with hyperglycemia: Secondary | ICD-10-CM

## 2024-03-15 DIAGNOSIS — Z794 Long term (current) use of insulin: Secondary | ICD-10-CM

## 2024-03-15 DIAGNOSIS — E782 Mixed hyperlipidemia: Secondary | ICD-10-CM | POA: Diagnosis not present

## 2024-03-15 DIAGNOSIS — I10 Essential (primary) hypertension: Secondary | ICD-10-CM

## 2024-03-15 MED ORDER — NOVOLOG MIX 70/30 FLEXPEN (70-30) 100 UNIT/ML ~~LOC~~ SUPN: PEN_INJECTOR | SUBCUTANEOUS | 2 refills | Status: DC

## 2024-03-15 MED ORDER — BD PEN NEEDLE MINI ULTRAFINE 31G X 5 MM MISC: 3 refills | Status: AC

## 2024-03-15 MED ORDER — EMPAGLIFLOZIN 25 MG PO TABS: 25.0000 mg | ORAL_TABLET | Freq: Every day | ORAL | 1 refills | Status: DC

## 2024-03-15 NOTE — Progress Notes (Signed)
 03/15/2024, 5:16 PM   Endocrinology follow-up note   Subjective:    Patient ID: [name], female    DOB: 05-01-1961.  [name] is being seen in follow-up in the management of her currently uncontrolled type 2 diabetes, hypertension. EFI:Qjwuj, Benita Area, MD.   Past Medical History:  Diagnosis Date   Allergy    Asthma    COPD (chronic obstructive pulmonary disease) (HCC)    Diabetes mellitus without complication (HCC)    Hypertension    IIH (idiopathic intracranial hypertension)    Morbid obesity due to excess calories (HCC)    Umbilical hernia     Past Surgical History:  Procedure Laterality Date   CARPAL TUNNEL RELEASE Right    CHOLECYSTECTOMY     COLONOSCOPY N/A 01/12/2015   Procedure: COLONOSCOPY;  Surgeon: Margo CROME Haddock, MD;  Location: [facility];  Service: Endoscopy;  Laterality: N/A;  0830   HYSTEROSCOPY WITH D & C N/A 04/01/2013   Procedure: DILATATION AND CURETTAGE /HYSTEROSCOPY;  Surgeon: Vonn VEAR Inch, MD;  Location: [facility];  Service: Gynecology;  Laterality: N/A;   NASAL POLYP SURGERY      Social History   Socioeconomic History   Marital status: Married    Spouse name: Not on file   Number of children: 2   Years of education: Not on file   Highest education level: Not on file  Occupational History   Not on file  Tobacco Use   Smoking status: Former    Current packs/day: 0.00    Average packs/day: 0.5 packs/day for 20.0 years (10.0 ttl pk-yrs)    Types: Cigarettes    Start date: 03/25/1977    Quit date: 03/25/1997    Years since quitting: 26.9   Smokeless tobacco: Never  Vaping Use   Vaping status: Never Used  Substance and Sexual Activity   Alcohol use: Yes    Alcohol/week: 0.0 standard drinks of alcohol    Comment: Ocassional   Drug use: No   Sexual activity: Yes    Birth control/protection: None, Post-menopausal  Other Topics Concern   Not on  file  Social History Narrative   Not on file   Social Drivers of Health   Financial Resource Strain: Low Risk  (07/02/2023)   Overall Financial Resource Strain (CARDIA)    Difficulty of Paying Living Expenses: Not hard at all  Food Insecurity: No Food Insecurity (07/02/2023)   Hunger Vital Sign    Worried About Running Out of Food in the Last Year: Never true    Ran Out of Food in the Last Year: Never true  Transportation Needs: No Transportation Needs (07/02/2023)   PRAPARE - Administrator, Civil Service (Medical): No    Lack of Transportation (Non-Medical): No  Physical Activity: Inactive (07/02/2023)   Exercise Vital Sign    Days of Exercise per Week: 0 days    Minutes of Exercise per Session: 0 min  Stress: No Stress Concern Present (07/02/2023)   [employer] of Occupational Health - Occupational Stress Questionnaire    Feeling of Stress : Not at all  Social Connections: Socially Integrated (07/02/2023)   Social Connection and  Isolation Panel    Frequency of Communication with Friends and Family: More than three times a week    Frequency of Social Gatherings with Friends and Family: Once a week    Attends Religious Services: More than 4 times per year    Active Member of [employer] or Organizations: Yes    Attends Engineer, structural: More than 4 times per year    Marital Status: Married    Family History  Problem Relation Age of Onset   Diabetes Mother    Kidney disease Mother    Other Mother        renal failure   Hypertension Mother    Cancer Mother        ?uterine cancer    Diabetes Father    Cancer Father        pancreatic   Diabetes Sister    Kidney disease Sister    Diabetes Maternal Grandmother    Asthma Maternal Grandmother    COPD Maternal Grandmother        bronchitis   Hypertension Maternal Grandmother    Thyroid  disease Maternal Grandmother    Hypertension Daughter    Colon cancer Neg Hx     Outpatient Encounter Medications  as of 03/15/2024  Medication Sig   acetaminophen  (TYLENOL ) 500 MG tablet Take 500 mg by mouth every 6 (six) hours as needed.   acetaZOLAMIDE (DIAMOX) 250 MG tablet Take 250 mg by mouth 3 (three) times daily.   amoxicillin -clavulanate (AUGMENTIN ) 875-125 MG tablet Take 1 tablet by mouth 2 (two) times daily.   aspirin  81 MG EC tablet Take 1 tablet by mouth daily.   atenolol  (TENORMIN ) 25 MG tablet Take 25 mg by mouth daily.   celecoxib (CELEBREX) 200 MG capsule Take 200 mg by mouth 2 (two) times daily.   Continuous Blood Gluc Sensor (DEXCOM G7 SENSOR) MISC Change sensor every 10 days. Use to check glucose as directed.   Continuous Glucose Sensor (DEXCOM G7 SENSOR) MISC CHANGE THE SENSOR EVERY 10 DAYS   diclofenac sodium (VOLTAREN) 1 % GEL Apply 2 g topically 4 (four) times daily as needed (pain). (Patient not taking: Reported on 12/08/2023)   empagliflozin  (JARDIANCE ) 25 MG TABS tablet Take 1 tablet (25 mg total) by mouth daily before breakfast.   fluconazole  (DIFLUCAN ) 100 MG tablet TAKE 1 TABLET BY MOUTH EVERY DAY   fluticasone  (FLONASE ) 50 MCG/ACT nasal spray Place 2 sprays into both nostrils daily.   Fluticasone -Salmeterol (ADVAIR) 250-50 MCG/DOSE AEPB Inhale 1 puff into the lungs 2 (two) times daily.    insulin  aspart protamine - aspart (NOVOLOG  MIX 70/30 FLEXPEN) (70-30) 100 UNIT/ML FlexPen INJECT 80 UNITS AT BREAKFAST AND 80 UNITS AT SUPPER DAILY AS DIRECTED.   Insulin  Pen Needle (BD PEN NEEDLE MINI ULTRAFINE) 31G X 5 MM MISC Use to inject insulin  as directed   ipratropium-albuterol  (DUONEB) 0.5-2.5 (3) MG/3ML SOLN Take 3 mLs by nebulization every 4 (four) hours as needed (Shortness of Breath).    losartan -hydrochlorothiazide  (HYZAAR) 100-25 MG tablet Take 1 tablet by mouth daily.   metFORMIN  (GLUCOPHAGE ) 500 MG tablet TAKE 1 TABLET BY MOUTH TWICE A DAY WITH FOOD   Multiple Vitamin (MULTIVITAMIN WITH MINERALS) TABS tablet Take 1 tablet by mouth daily.   oxycodone  (OXY-IR) 5 MG capsule Take 5  mg by mouth every 4 (four) hours as needed for pain.   rosuvastatin  (CRESTOR ) 20 MG tablet Take 0.5 tablets (10 mg total) by mouth daily.   traMADol (ULTRAM) 50 MG tablet Take  50 mg by mouth 2 (two) times daily.   [DISCONTINUED] BD PEN NEEDLE MINI ULTRAFINE 31G X 5 MM MISC USE ONCE DAILY AS DIRECTED   [DISCONTINUED] empagliflozin  (JARDIANCE ) 10 MG TABS tablet Take 1 tablet (10 mg total) by mouth daily before breakfast.   [DISCONTINUED] insulin  aspart protamine - aspart (NOVOLOG  MIX 70/30 FLEXPEN) (70-30) 100 UNIT/ML FlexPen INJECT 70 UNITS AT BREAKFAST AND 70 UNITS AT SUPPER DAILY AS DIRECTED   No facility-administered encounter medications on file as of 03/15/2024.    ALLERGIES: No Known Allergies  VACCINATION STATUS: Immunization History  Administered Date(s) Administered   Influenza-Unspecified 06/09/2010, 06/09/2013   Pneumococcal Polysaccharide-23 08/05/2021    Diabetes She presents for her follow-up diabetic visit. She has type 2 diabetes mellitus. Onset time: She was diagnosed at approximate age of 40 years. Her disease course has been worsening. There are no hypoglycemic associated symptoms. Pertinent negatives for hypoglycemia include no confusion, headaches, pallor or seizures. Pertinent negatives for diabetes include no chest pain, no fatigue, no polydipsia, no polyphagia and no polyuria. There are no hypoglycemic complications. Symptoms are worsening. There are no diabetic complications. Risk factors for coronary artery disease include diabetes mellitus, dyslipidemia, family history, obesity, tobacco exposure, sedentary lifestyle, post-menopausal and hypertension. Current diabetic treatment includes insulin  injections. Her weight is fluctuating minimally. She is following a generally unhealthy diet. When asked about meal planning, she reported none. She has not had a previous visit with a dietitian. She rarely participates in exercise. Her home blood glucose trend is increasing  steadily. Her breakfast blood glucose range is generally 180-200 mg/dl. Her lunch blood glucose range is generally 180-200 mg/dl. Her dinner blood glucose range is generally 180-200 mg/dl. Her bedtime blood glucose range is generally 180-200 mg/dl. Her overall blood glucose range is 180-200 mg/dl. (She presents with significantly above target glycemic profile with point-of-care A1c of 8% her AGP report shows 54% time in range, 32% mid 1 hyperglycemia, 13% level 2 hyperglycemia.  Her glucose variability was 32.4%.     She did not document any hypoglycemia.   ) An ACE inhibitor/angiotensin II receptor blocker is being taken.  Hyperlipidemia This is a chronic problem. The current episode started more than 1 year ago. The problem is controlled. Exacerbating diseases include diabetes and obesity. Pertinent negatives include no chest pain, myalgias or shortness of breath. Current antihyperlipidemic treatment includes statins. Risk factors for coronary artery disease include diabetes mellitus, dyslipidemia, family history, hypertension, obesity, a sedentary lifestyle and post-menopausal.  Hypertension This is a chronic problem. The current episode started more than 1 year ago. Pertinent negatives include no chest pain, headaches, palpitations or shortness of breath. Risk factors for coronary artery disease include diabetes mellitus, dyslipidemia, obesity, post-menopausal state, smoking/tobacco exposure, sedentary lifestyle and family history. Past treatments include ACE inhibitors.    Review of systems  Constitutional: + Minimally fluctuating body weight,  current  Body mass index is 42.29 kg/m. , no fatigue, no subjective hyperthermia, no subjective hypothermia    Objective:    BP 136/82   Pulse 76   Ht 5' 1 (1.549 m)   Wt 223 lb 12.8 oz (101.5 kg)   LMP 02/25/2019   BMI 42.29 kg/m   Wt Readings from Last 3 Encounters:  03/15/24 223 lb 12.8 oz (101.5 kg)  11/11/23 225 lb (102.1 kg)  07/14/23  223 lb 12.8 oz (101.5 kg)    Physical Exam- Limited  Constitutional:  Body mass index is 42.29 kg/m. , not in acute distress, normal state  of mind    CMP     Component Value Date/Time   NA 141 02/09/2024 0000   K 4.6 02/09/2024 0000   CL 105 02/09/2024 0000   CO2 24 (A) 02/09/2024 0000   GLUCOSE 128 (H) 06/19/2023 0411   BUN 20 02/09/2024 0000   CREATININE 0.8 02/09/2024 0000   CREATININE 0.87 06/19/2023 0411   CALCIUM  9.9 02/09/2024 0000   PROT 6.9 06/19/2023 0411   PROT 6.7 02/23/2023 1603   ALBUMIN 4.2 02/09/2024 0000   ALBUMIN 4.2 02/23/2023 1603   AST 14 02/09/2024 0000   ALT 19 02/09/2024 0000   ALKPHOS 77 02/09/2024 0000   BILITOT 0.6 06/19/2023 0411   BILITOT <0.2 02/23/2023 1603   GFRNONAA >60 06/19/2023 0411   GFRAA >60 04/21/2019 0854     Diabetic Labs (most recent): Lab Results  Component Value Date   HGBA1C 8.0 02/09/2024   HGBA1C 7.8 (A) 11/11/2023   HGBA1C 8.5 (H) 06/17/2023   MICROALBUR 122.3 (H) 04/21/2019     Lipid Panel ( most recent) Lipid Panel     Component Value Date/Time   CHOL 140 02/09/2024 0000   CHOL 116 05/05/2022 1101   TRIG 83 02/09/2024 0000   HDL 47 02/09/2024 0000   HDL 45 05/05/2022 1101   CHOLHDL 2.6 05/05/2022 1101   CHOLHDL 3.5 04/21/2019 0855   VLDL 28 04/21/2019 0855   LDLCALC 77 02/09/2024 0000   LDLCALC 56 05/05/2022 1101   LABVLDL 15 05/05/2022 1101      Lab Results  Component Value Date   TSH 2.110 05/05/2022   TSH 1.210 11/07/2020   TSH 2.110 01/16/2020   TSH 3.399 03/07/2013   FREET4 1.01 05/05/2022   FREET4 1.13 11/07/2020   FREET4 1.06 01/16/2020     Assessment & Plan:   1. Uncontrolled type 2 diabetes mellitus with hyperglycemia (HCC)  - Shonica L Reinard has currently uncontrolled symptomatic type 2 DM since 63 years of age.  She presents with significantly above target glycemic profile with point-of-care A1c of 8% her AGP report shows 54% time in range, 32% mid 1 hyperglycemia, 13% level 2  hyperglycemia.  Her glucose variability was 32.4%.     She did not document any hypoglycemia.  - I had a long discussion with her about the progressive nature of diabetes and the pathology behind its complications. -her diabetes is complicated by obesity, non- engagement for management plan,  history of smoking and she remains at a high risk for more acute and chronic complications which include CAD, CVA, CKD, retinopathy, and neuropathy. These are all discussed in detail with her.  - I have counseled her on diet  and weight management  by adopting a carbohydrate restricted/protein rich diet. Patient is encouraged to switch to  unprocessed or minimally processed     complex starch and increased protein intake (animal or plant source), fruits, and vegetables. -  she is advised to stick to a routine mealtimes to eat 3 meals  a day and avoid unnecessary snacks ( to snack only to correct hypoglycemia).    - she acknowledges that there is a room for improvement in her food and drink choices. - Suggestion is made for her to avoid simple carbohydrates  from her diet including Cakes, Sweet Desserts, Ice Cream, Soda (diet and regular), Sweet Tea, Candies, Chips, Cookies, Store Bought Juices, Alcohol , Artificial Sweeteners,  Coffee Creamer, and Sugar-free Products, Lemonade. This will help patient to have more stable blood glucose profile and  potentially avoid unintended weight gain.   - she has been scheduled with Penny Crumpton, RDN, CDE for diabetes education.  - I have approached her with the following individualized plan to manage  her diabetes and patient agrees:    -Ms. Akers presents with above target glycemc profile, advised to increase  NovoLog  70/30 to 80 units with breakfast and 80 with supper for Premeal blood glucose readings above 90 mg per DL.  She is encouraged to continue to use her CGM continuously.    -She is advised to continue metformin  500 mg p.o. twice daily.  She continues to  tolerate Jardiance , discussed and increase Jardiance  to 25 mg p.o. daily at breakfast.    Side effects and precautions discussed with her.   - Specific targets for  A1c;  LDL, HDL,  and Triglycerides were discussed with the patient.  2) Blood Pressure /Hypertension:   Her blood pressure is controlled to target.  She has amlodipine  5 mg p.o. daily, Hyzaar, losartan /HCTZ.   The above described WF PB diet will help with hypertension and hyperlipidemia as well.    3) Lipids/Hyperlipidemia:   Review of her recent lipid panel showed controlled LDL at 77.  She is advised to continue Crestor  20 mg p.o. daily at bedtime.     Side effects and precautions discussed with her.    4)  Weight/Diet: Her BMI is 42.29 kg/m- clearly complicating her diabetes care.   she is  a candidate for modest weight loss. I discussed with her the fact that loss of 5 - 10% of her  current body weight will have the most impact on her diabetes management.  Exercise, and detailed carbohydrates information provided  -  detailed on discharge instructions.  5) Chronic Care/Health Maintenance:  -she  is on ACEI/ARB and Statin medications and  is encouraged to initiate and continue to follow up with Ophthalmology, Dentist,  Podiatrist at least yearly or according to recommendations, and advised to  stay away from smoking. I have recommended yearly flu vaccine and pneumonia vaccine at least every 5 years; moderate intensity exercise for up to 150 minutes weekly; and  sleep for at least 7 hours a day.  Her ABI was recently normal on April 26, 2020.  - she is  advised to maintain close follow up with Fanta, Tesfaye Demissie, MD for primary care needs, as well as her other providers for optimal and coordinated care.   I spent  40  minutes in the care of the patient today including review of labs from CMP, Lipids, Thyroid  Function, Hematology (current and previous including abstractions from other facilities); face-to-face time  discussing  her blood glucose readings/logs, discussing hypoglycemia and hyperglycemia episodes and symptoms, medications doses, her options of short and long term treatment based on the latest standards of care / guidelines;  discussion about incorporating lifestyle medicine;  and documenting the encounter. Risk reduction counseling performed per USPSTF guidelines to reduce  obesity and cardiovascular risk factors.     Please refer to Patient Instructions for Blood Glucose Monitoring and Insulin /Medications Dosing Guide  in media tab for additional information. Please  also refer to  Patient Self Inventory in the Media  tab for reviewed elements of pertinent patient history.  Damani L Pavlov participated in the discussions, expressed understanding, and voiced agreement with the above plans.  All questions were answered to her satisfaction. she is encouraged to contact clinic should she have any questions or concerns prior to her return visit.  Follow up plan: - Return in about 4 months (around 07/16/2024) for Bring Meter/CGM Device/Logs- A1c in Office.  Ranny Earl, MD [facility] [address] [place], [place] [address] Phone: [phone]  Fax: [phone]    03/15/2024, 5:16 PM  This note was partially dictated with voice recognition software. Similar sounding words can be transcribed inadequately or may not  be corrected upon review.

## 2024-03-15 NOTE — Patient Instructions (Signed)

## 2024-03-26 ENCOUNTER — Other Ambulatory Visit: Payer: Self-pay | Admitting: "Endocrinology

## 2024-03-31 ENCOUNTER — Other Ambulatory Visit: Payer: Self-pay

## 2024-03-31 MED ORDER — INSULIN LISPRO PROT & LISPRO (75-25 MIX) 100 UNIT/ML KWIKPEN: PEN_INJECTOR | SUBCUTANEOUS | 2 refills | Status: AC

## 2024-04-13 ENCOUNTER — Other Ambulatory Visit: Payer: Self-pay | Admitting: Obstetrics & Gynecology

## 2024-04-19 ENCOUNTER — Other Ambulatory Visit: Payer: Self-pay | Admitting: "Endocrinology

## 2024-04-19 DIAGNOSIS — J4530 Mild persistent asthma, uncomplicated: Secondary | ICD-10-CM | POA: Diagnosis not present

## 2024-04-19 DIAGNOSIS — Z23 Encounter for immunization: Secondary | ICD-10-CM | POA: Diagnosis not present

## 2024-04-19 DIAGNOSIS — E1165 Type 2 diabetes mellitus with hyperglycemia: Secondary | ICD-10-CM | POA: Diagnosis not present

## 2024-05-12 DIAGNOSIS — J342 Deviated nasal septum: Secondary | ICD-10-CM | POA: Diagnosis not present

## 2024-05-12 DIAGNOSIS — Z9889 Other specified postprocedural states: Secondary | ICD-10-CM | POA: Diagnosis not present

## 2024-05-12 DIAGNOSIS — Q019 Encephalocele, unspecified: Secondary | ICD-10-CM | POA: Diagnosis not present

## 2024-05-12 DIAGNOSIS — G9601 Cranial cerebrospinal fluid leak, spontaneous: Secondary | ICD-10-CM | POA: Diagnosis not present

## 2024-05-12 DIAGNOSIS — J3489 Other specified disorders of nose and nasal sinuses: Secondary | ICD-10-CM | POA: Diagnosis not present

## 2024-06-25 ENCOUNTER — Other Ambulatory Visit: Payer: Self-pay | Admitting: "Endocrinology

## 2024-07-08 ENCOUNTER — Other Ambulatory Visit: Payer: Self-pay

## 2024-07-08 ENCOUNTER — Other Ambulatory Visit: Payer: Self-pay | Admitting: "Endocrinology

## 2024-07-08 DIAGNOSIS — E1165 Type 2 diabetes mellitus with hyperglycemia: Secondary | ICD-10-CM

## 2024-07-08 MED ORDER — EMPAGLIFLOZIN 25 MG PO TABS: 25.0000 mg | ORAL_TABLET | Freq: Every day | ORAL | 0 refills | Status: AC

## 2024-07-18 ENCOUNTER — Ambulatory Visit: Admitting: "Endocrinology

## 2024-08-09 ENCOUNTER — Ambulatory Visit: Admitting: Obstetrics & Gynecology
